# Patient Record
Sex: Female | Born: 1935 | Race: White | Hispanic: No | Smoking: Never smoker
Health system: Southern US, Community
[De-identification: ages and names within clinical notes are randomized; demographics above are authoritative.]

## PROBLEM LIST (undated history)

## (undated) DIAGNOSIS — H409 Unspecified glaucoma: Secondary | ICD-10-CM

## (undated) DIAGNOSIS — K56609 Unspecified intestinal obstruction, unspecified as to partial versus complete obstruction: Secondary | ICD-10-CM

## (undated) DIAGNOSIS — E039 Hypothyroidism, unspecified: Secondary | ICD-10-CM

## (undated) HISTORY — PX: ABDOMINAL HYSTERECTOMY: SHX81

## (undated) HISTORY — PX: OTHER SURGICAL HISTORY: SHX169

---

## 1998-03-11 ENCOUNTER — Other Ambulatory Visit: Admission: RE | Admit: 1998-03-11 | Discharge: 1998-03-11 | Payer: Self-pay | Admitting: Obstetrics and Gynecology

## 2004-01-29 ENCOUNTER — Ambulatory Visit (HOSPITAL_COMMUNITY): Admission: RE | Admit: 2004-01-29 | Discharge: 2004-01-29 | Payer: Self-pay | Admitting: Unknown Physician Specialty

## 2005-01-10 ENCOUNTER — Ambulatory Visit (HOSPITAL_COMMUNITY): Admission: RE | Admit: 2005-01-10 | Discharge: 2005-01-10 | Payer: Self-pay | Admitting: Family Medicine

## 2007-04-24 ENCOUNTER — Ambulatory Visit (HOSPITAL_COMMUNITY): Admission: RE | Admit: 2007-04-24 | Discharge: 2007-04-24 | Payer: Self-pay | Admitting: Pediatrics

## 2007-05-29 ENCOUNTER — Ambulatory Visit (HOSPITAL_COMMUNITY): Admission: RE | Admit: 2007-05-29 | Discharge: 2007-05-29 | Payer: Self-pay | Admitting: Pediatrics

## 2008-06-11 ENCOUNTER — Ambulatory Visit (HOSPITAL_COMMUNITY): Admission: RE | Admit: 2008-06-11 | Discharge: 2008-06-11 | Payer: Self-pay | Admitting: Pediatrics

## 2008-06-19 ENCOUNTER — Ambulatory Visit (HOSPITAL_COMMUNITY): Admission: RE | Admit: 2008-06-19 | Discharge: 2008-06-19 | Payer: Self-pay | Admitting: Pediatrics

## 2009-06-22 ENCOUNTER — Ambulatory Visit (HOSPITAL_COMMUNITY): Admission: RE | Admit: 2009-06-22 | Discharge: 2009-06-22 | Payer: Self-pay | Admitting: Pediatrics

## 2010-06-28 ENCOUNTER — Ambulatory Visit (HOSPITAL_COMMUNITY): Admission: RE | Admit: 2010-06-28 | Discharge: 2010-06-28 | Payer: Self-pay | Admitting: Pediatrics

## 2010-07-22 ENCOUNTER — Ambulatory Visit (HOSPITAL_COMMUNITY): Admission: RE | Admit: 2010-07-22 | Discharge: 2010-07-22 | Payer: Self-pay | Admitting: Ophthalmology

## 2010-08-19 ENCOUNTER — Ambulatory Visit (HOSPITAL_COMMUNITY): Admission: RE | Admit: 2010-08-19 | Discharge: 2010-08-19 | Payer: Self-pay | Admitting: Ophthalmology

## 2010-09-15 ENCOUNTER — Ambulatory Visit (HOSPITAL_COMMUNITY): Admission: RE | Admit: 2010-09-15 | Discharge: 2010-09-15 | Payer: Self-pay | Admitting: Pediatrics

## 2010-11-21 ENCOUNTER — Encounter: Payer: Self-pay | Admitting: Pediatrics

## 2010-11-22 ENCOUNTER — Encounter: Payer: Self-pay | Admitting: Pediatrics

## 2011-01-13 LAB — BASIC METABOLIC PANEL
BUN: 16 mg/dL (ref 6–23)
CO2: 29 mEq/L (ref 19–32)
Chloride: 102 mEq/L (ref 96–112)
Creatinine, Ser: 0.7 mg/dL (ref 0.4–1.2)
Glucose, Bld: 67 mg/dL — ABNORMAL LOW (ref 70–99)
Potassium: 4.4 mEq/L (ref 3.5–5.1)

## 2011-04-07 ENCOUNTER — Encounter (INDEPENDENT_AMBULATORY_CARE_PROVIDER_SITE_OTHER): Payer: Self-pay | Admitting: Internal Medicine

## 2011-06-01 MED ORDER — SODIUM CHLORIDE 0.45 % IV SOLN
Freq: Once | INTRAVENOUS | Status: AC
  Administered 2011-06-02: 11:00:00 via INTRAVENOUS

## 2011-06-02 ENCOUNTER — Ambulatory Visit (HOSPITAL_COMMUNITY)
Admission: RE | Admit: 2011-06-02 | Discharge: 2011-06-02 | Disposition: A | Discharge status: Home / Self Care | Payer: Medicare Other | Source: Ambulatory Visit | Attending: Internal Medicine | Admitting: Internal Medicine

## 2011-06-02 ENCOUNTER — Encounter (HOSPITAL_COMMUNITY)
Admission: RE | Disposition: A | Discharge status: Home / Self Care | Payer: Self-pay | Source: Ambulatory Visit | Attending: Internal Medicine

## 2011-06-02 ENCOUNTER — Encounter (HOSPITAL_COMMUNITY): Payer: Self-pay | Admitting: *Deleted

## 2011-06-02 ENCOUNTER — Encounter (INDEPENDENT_AMBULATORY_CARE_PROVIDER_SITE_OTHER): Payer: Medicare Other | Admitting: Internal Medicine

## 2011-06-02 DIAGNOSIS — Z860100 Personal history of colon polyps, unspecified: Secondary | ICD-10-CM | POA: Insufficient documentation

## 2011-06-02 DIAGNOSIS — K5730 Diverticulosis of large intestine without perforation or abscess without bleeding: Secondary | ICD-10-CM | POA: Insufficient documentation

## 2011-06-02 HISTORY — PX: COLONOSCOPY: SHX5424

## 2011-06-02 HISTORY — DX: Hypothyroidism, unspecified: E03.9

## 2011-06-02 SURGERY — COLONOSCOPY
Anesthesia: Moderate Sedation

## 2011-06-02 MED ORDER — MEPERIDINE HCL 50 MG/ML IJ SOLN
INTRAMUSCULAR | Status: DC | PRN
  Administered 2011-06-02 (×2): 25 mg via INTRAVENOUS

## 2011-06-02 MED ORDER — STERILE WATER FOR IRRIGATION IR SOLN
Status: DC | PRN
  Administered 2011-06-02: 12:00:00

## 2011-06-02 MED ORDER — MIDAZOLAM HCL 5 MG/5ML IJ SOLN
INTRAMUSCULAR | Status: DC | PRN
  Administered 2011-06-02: 2 mg via INTRAVENOUS

## 2011-06-02 MED ORDER — MEPERIDINE HCL 50 MG/ML IJ SOLN
INTRAMUSCULAR | Status: AC
  Filled 2011-06-02: qty 1

## 2011-06-02 MED ORDER — MIDAZOLAM HCL 5 MG/5ML IJ SOLN
INTRAMUSCULAR | Status: AC
  Filled 2011-06-02: qty 10

## 2011-06-02 NOTE — Op Note (Signed)
COLONOSCOPY PROCEDURE REPORT  PATIENT:  Megan Conley  MR#:  [id number] Birthdate:  Nov 15, 1935, 75 y.o., female Endoscopist:  Dr. Malissa Hippo, MD Referred By:  Dr. Vivia Ewing. Procedure Date: 06/02/2011  Procedure:   Colonoscopy  Indications:  Patient had large adenoma removed from rectosigmoid junction in October 2008. She is here for surveillance colonoscopy.  Informed Consent:  The risks, benefits, alternatives & imponderables which include, but are not limited to, bleeding, infection, perforation, drug reaction and potential missed lesion have been reviewed.  The potential for biopsy, lesion removal, etc. Have also been discussed.  Questions have been answered.  All parties agreeable.  Please see history & physical in medical record for more information.  Medications:  Demerol 50mg  IV Versed 2mg  IV  Description of procedure:  After a digital rectal exam was performed, that colonoscope was advanced from the anus through the rectum and colon to the area of the cecum, ileocecal valve and appendiceal orifice. The cecum was deeply intubated. These structures were well-seen and photographed for the record. From the level of the cecum and ileocecal valve, the scope was slowly and cautiously withdrawn. The mucosal surfaces were carefully surveyed utilizing scope tip to flexion to facilitate fold flattening as needed. The scope was pulled down into the rectum where a thorough exam including retroflexion was performed.  Prep:  Excellent. Findings:  Single diverticulum at sigmoid colon. Long linear scar at rectosigmoid junction site of previous polypectomy. No evidence of recurrence  Therapeutic/Diagnostic Maneuvers Performed:  None  Complications:  None  Cecal Withdrawal Time:  9 minutes  Impression:  Single sigmoid colon diverticulum otherwise normal examination.  Recommendations:  Next exam in 5 years.  REHMAN,NAJEEB U  06/02/2011 12:02 PM  CC: Dr. Ara Kussmaul, MD

## 2011-06-02 NOTE — H&P (Signed)
  Primary Care Physician:  Ara Kussmaul, MD Primary Gastroenterologist:  Dr. Karilyn Cota  Pre-Procedure History & Physical: HPI:  Megan Conley is a 75 y.o. female here for for surveillance colonoscopy. . She had  large adenoma removed from rectosigmoid junction in October 2008. She had a sigmoidoscopy in March 2009 no residual polyp was found. She she's feeling fine. She denies abdominal pain rectal bleeding or change in her bowel habits.  Past Medical History  Diagnosis Date  . Hypothyroidism     Past Surgical History  Procedure Date  . Abdominal hysterectomy   . Cataract extraction both eyes     Prior to Admission medications   Medication Sig Start Date End Date Taking? Authorizing Provider  levothyroxine (SYNTHROID, LEVOTHROID) 50 MCG tablet Take 50 mcg by mouth daily.     Yes Historical Provider, MD  Omega-3 Fatty Acids (FISH OIL) 500 MG CAPS Take by mouth.     Yes Historical Provider, MD  Red Yeast Rice 600 MG CAPS Take by mouth.     Yes Historical Provider, MD    Allergies as of 04/22/2011  . (Not on File)    History reviewed. No pertinent family history.  History   Social History  . Marital Status: Married    Spouse Name: N/A    Number of Children: N/A  . Years of Education: N/A   Occupational History  . Not on file.   Social History Main Topics  . Smoking status: Never Smoker   . Smokeless tobacco: Not on file  . Alcohol Use: 2.4 oz/week    4 Glasses of wine per week  . Drug Use: No  . Sexually Active:    Other Topics Concern  . Not on file   Social History Narrative  . No narrative on file    Physical Exam: BP 123/54  Pulse 56  Temp(Src) 97.6 F (36.4 C) (Oral)  Resp 18  Ht 5\' 2"  (1.575 m)  Wt 126 lb (57.153 kg)  BMI 23.05 kg/m2  SpO2 100% General:   Alert,  Well-developed, well-nourished, pleasant and cooperative in NAD Head:  Normocephalic and atraumatic. Eyes:  Sclera clear, no icterus.   Conjunctiva pink. Mouth:  Oropharyngeal mucosa  is normal.  Neck:  Supple; no masses or thyromegaly. Lungs:  Clear throughout to auscultation.   No wheezes, crackles, or rhonchi. No acute distress. Heart:  Regular rate and rhythm; no murmurs, clicks, rubs,  or gallops. Abdomen:  Soft, nontender and nondistended. No masses, hepatosplenomegaly or hernias noted. Normal bowel sounds, without guarding, and without rebound.   Rectal:  Deferred until time of colonoscopy.   Extremities:  Without clubbing or edema. Neurologic:  Alert and  oriented x4;  grossly normal neurologically. Skin:  Intact without significant lesions or rashes.  Impression/Plan:   History of complex colonic adenoma in October 2008 with a negative sigmoidoscopy in March 2009. Patient undergoing surveillance colonoscopy. Procedure and risks reviewed with the patient and she is agreeable.

## 2011-06-09 ENCOUNTER — Encounter (HOSPITAL_COMMUNITY): Payer: Self-pay | Admitting: Internal Medicine

## 2011-06-14 ENCOUNTER — Other Ambulatory Visit (HOSPITAL_COMMUNITY): Payer: Self-pay | Admitting: Internal Medicine

## 2011-06-14 DIAGNOSIS — Z139 Encounter for screening, unspecified: Secondary | ICD-10-CM

## 2011-07-01 ENCOUNTER — Ambulatory Visit (HOSPITAL_COMMUNITY)
Admission: RE | Admit: 2011-07-01 | Discharge: 2011-07-01 | Disposition: A | Discharge status: Home / Self Care | Payer: Medicare Other | Source: Ambulatory Visit | Attending: Internal Medicine | Admitting: Internal Medicine

## 2011-07-01 DIAGNOSIS — Z139 Encounter for screening, unspecified: Secondary | ICD-10-CM

## 2011-07-01 DIAGNOSIS — Z1231 Encounter for screening mammogram for malignant neoplasm of breast: Secondary | ICD-10-CM | POA: Insufficient documentation

## 2012-01-24 DIAGNOSIS — E785 Hyperlipidemia, unspecified: Secondary | ICD-10-CM | POA: Diagnosis not present

## 2012-01-24 DIAGNOSIS — M810 Age-related osteoporosis without current pathological fracture: Secondary | ICD-10-CM | POA: Diagnosis not present

## 2012-07-12 DIAGNOSIS — E039 Hypothyroidism, unspecified: Secondary | ICD-10-CM | POA: Diagnosis not present

## 2012-07-12 DIAGNOSIS — E785 Hyperlipidemia, unspecified: Secondary | ICD-10-CM | POA: Diagnosis not present

## 2012-07-12 DIAGNOSIS — Z23 Encounter for immunization: Secondary | ICD-10-CM | POA: Diagnosis not present

## 2012-07-19 ENCOUNTER — Other Ambulatory Visit (HOSPITAL_COMMUNITY): Payer: Self-pay | Admitting: Internal Medicine

## 2012-07-19 DIAGNOSIS — Z139 Encounter for screening, unspecified: Secondary | ICD-10-CM

## 2012-07-26 ENCOUNTER — Ambulatory Visit (HOSPITAL_COMMUNITY)
Admission: RE | Admit: 2012-07-26 | Discharge: 2012-07-26 | Disposition: A | Discharge status: Home / Self Care | Payer: Medicare Other | Source: Ambulatory Visit | Attending: Internal Medicine | Admitting: Internal Medicine

## 2012-07-26 DIAGNOSIS — E039 Hypothyroidism, unspecified: Secondary | ICD-10-CM | POA: Diagnosis not present

## 2012-07-26 DIAGNOSIS — Z1231 Encounter for screening mammogram for malignant neoplasm of breast: Secondary | ICD-10-CM | POA: Insufficient documentation

## 2012-07-26 DIAGNOSIS — E785 Hyperlipidemia, unspecified: Secondary | ICD-10-CM | POA: Diagnosis not present

## 2012-07-26 DIAGNOSIS — Z139 Encounter for screening, unspecified: Secondary | ICD-10-CM

## 2012-08-13 DIAGNOSIS — H52229 Regular astigmatism, unspecified eye: Secondary | ICD-10-CM | POA: Diagnosis not present

## 2012-08-13 DIAGNOSIS — H524 Presbyopia: Secondary | ICD-10-CM | POA: Diagnosis not present

## 2012-08-13 DIAGNOSIS — H5210 Myopia, unspecified eye: Secondary | ICD-10-CM | POA: Diagnosis not present

## 2012-08-13 DIAGNOSIS — H4020X0 Unspecified primary angle-closure glaucoma, stage unspecified: Secondary | ICD-10-CM | POA: Diagnosis not present

## 2012-09-14 ENCOUNTER — Encounter (INDEPENDENT_AMBULATORY_CARE_PROVIDER_SITE_OTHER): Payer: Self-pay

## 2012-10-29 DIAGNOSIS — H40009 Preglaucoma, unspecified, unspecified eye: Secondary | ICD-10-CM | POA: Diagnosis not present

## 2012-11-16 DIAGNOSIS — H40019 Open angle with borderline findings, low risk, unspecified eye: Secondary | ICD-10-CM | POA: Diagnosis not present

## 2012-11-16 DIAGNOSIS — H52209 Unspecified astigmatism, unspecified eye: Secondary | ICD-10-CM | POA: Diagnosis not present

## 2012-11-16 DIAGNOSIS — H47099 Other disorders of optic nerve, not elsewhere classified, unspecified eye: Secondary | ICD-10-CM | POA: Diagnosis not present

## 2012-11-22 DIAGNOSIS — E785 Hyperlipidemia, unspecified: Secondary | ICD-10-CM | POA: Diagnosis not present

## 2012-11-22 DIAGNOSIS — R42 Dizziness and giddiness: Secondary | ICD-10-CM | POA: Diagnosis not present

## 2012-11-22 DIAGNOSIS — H47099 Other disorders of optic nerve, not elsewhere classified, unspecified eye: Secondary | ICD-10-CM | POA: Diagnosis not present

## 2012-11-22 DIAGNOSIS — H40019 Open angle with borderline findings, low risk, unspecified eye: Secondary | ICD-10-CM | POA: Diagnosis not present

## 2012-11-22 DIAGNOSIS — R5381 Other malaise: Secondary | ICD-10-CM | POA: Diagnosis not present

## 2012-12-20 DIAGNOSIS — H401290 Low-tension glaucoma, unspecified eye, stage unspecified: Secondary | ICD-10-CM | POA: Diagnosis not present

## 2012-12-20 DIAGNOSIS — H409 Unspecified glaucoma: Secondary | ICD-10-CM | POA: Diagnosis not present

## 2013-01-21 DIAGNOSIS — H409 Unspecified glaucoma: Secondary | ICD-10-CM | POA: Diagnosis not present

## 2013-01-21 DIAGNOSIS — H401290 Low-tension glaucoma, unspecified eye, stage unspecified: Secondary | ICD-10-CM | POA: Diagnosis not present

## 2013-06-06 DIAGNOSIS — H409 Unspecified glaucoma: Secondary | ICD-10-CM | POA: Diagnosis not present

## 2013-06-06 DIAGNOSIS — H401290 Low-tension glaucoma, unspecified eye, stage unspecified: Secondary | ICD-10-CM | POA: Diagnosis not present

## 2013-07-26 DIAGNOSIS — I10 Essential (primary) hypertension: Secondary | ICD-10-CM | POA: Diagnosis not present

## 2013-07-26 DIAGNOSIS — E039 Hypothyroidism, unspecified: Secondary | ICD-10-CM | POA: Diagnosis not present

## 2013-07-26 DIAGNOSIS — E785 Hyperlipidemia, unspecified: Secondary | ICD-10-CM | POA: Diagnosis not present

## 2013-07-29 DIAGNOSIS — E039 Hypothyroidism, unspecified: Secondary | ICD-10-CM | POA: Diagnosis not present

## 2013-07-29 DIAGNOSIS — R42 Dizziness and giddiness: Secondary | ICD-10-CM | POA: Diagnosis not present

## 2013-07-29 DIAGNOSIS — Z23 Encounter for immunization: Secondary | ICD-10-CM | POA: Diagnosis not present

## 2013-07-29 DIAGNOSIS — E785 Hyperlipidemia, unspecified: Secondary | ICD-10-CM | POA: Diagnosis not present

## 2013-08-02 ENCOUNTER — Other Ambulatory Visit (HOSPITAL_COMMUNITY): Payer: Self-pay | Admitting: Internal Medicine

## 2013-08-02 DIAGNOSIS — Z139 Encounter for screening, unspecified: Secondary | ICD-10-CM

## 2013-08-06 ENCOUNTER — Ambulatory Visit (HOSPITAL_COMMUNITY)
Admission: RE | Admit: 2013-08-06 | Discharge: 2013-08-06 | Disposition: A | Discharge status: Home / Self Care | Payer: Medicare Other | Source: Ambulatory Visit | Attending: Internal Medicine | Admitting: Internal Medicine

## 2013-08-06 DIAGNOSIS — Z1231 Encounter for screening mammogram for malignant neoplasm of breast: Secondary | ICD-10-CM | POA: Diagnosis not present

## 2013-08-06 DIAGNOSIS — Z139 Encounter for screening, unspecified: Secondary | ICD-10-CM

## 2013-12-06 DIAGNOSIS — H401290 Low-tension glaucoma, unspecified eye, stage unspecified: Secondary | ICD-10-CM | POA: Diagnosis not present

## 2013-12-06 DIAGNOSIS — Z961 Presence of intraocular lens: Secondary | ICD-10-CM | POA: Diagnosis not present

## 2013-12-06 DIAGNOSIS — H409 Unspecified glaucoma: Secondary | ICD-10-CM | POA: Diagnosis not present

## 2014-01-28 DIAGNOSIS — E039 Hypothyroidism, unspecified: Secondary | ICD-10-CM | POA: Diagnosis not present

## 2014-01-28 DIAGNOSIS — I10 Essential (primary) hypertension: Secondary | ICD-10-CM | POA: Diagnosis not present

## 2014-01-28 DIAGNOSIS — E785 Hyperlipidemia, unspecified: Secondary | ICD-10-CM | POA: Diagnosis not present

## 2014-01-30 DIAGNOSIS — E039 Hypothyroidism, unspecified: Secondary | ICD-10-CM | POA: Diagnosis not present

## 2014-01-30 DIAGNOSIS — E785 Hyperlipidemia, unspecified: Secondary | ICD-10-CM | POA: Diagnosis not present

## 2014-06-05 DIAGNOSIS — H409 Unspecified glaucoma: Secondary | ICD-10-CM | POA: Diagnosis not present

## 2014-06-05 DIAGNOSIS — H401290 Low-tension glaucoma, unspecified eye, stage unspecified: Secondary | ICD-10-CM | POA: Diagnosis not present

## 2014-07-17 ENCOUNTER — Other Ambulatory Visit (HOSPITAL_COMMUNITY): Payer: Self-pay | Admitting: Internal Medicine

## 2014-07-17 DIAGNOSIS — Z1231 Encounter for screening mammogram for malignant neoplasm of breast: Secondary | ICD-10-CM

## 2014-08-07 DIAGNOSIS — E039 Hypothyroidism, unspecified: Secondary | ICD-10-CM | POA: Diagnosis not present

## 2014-08-07 DIAGNOSIS — Z23 Encounter for immunization: Secondary | ICD-10-CM | POA: Diagnosis not present

## 2014-08-07 DIAGNOSIS — E785 Hyperlipidemia, unspecified: Secondary | ICD-10-CM | POA: Diagnosis not present

## 2014-08-12 DIAGNOSIS — E039 Hypothyroidism, unspecified: Secondary | ICD-10-CM | POA: Diagnosis not present

## 2014-08-12 DIAGNOSIS — E782 Mixed hyperlipidemia: Secondary | ICD-10-CM | POA: Diagnosis not present

## 2014-08-21 ENCOUNTER — Ambulatory Visit (HOSPITAL_COMMUNITY)
Admission: RE | Admit: 2014-08-21 | Discharge: 2014-08-21 | Disposition: A | Discharge status: Home / Self Care | Payer: Medicare Other | Source: Ambulatory Visit | Attending: Internal Medicine | Admitting: Internal Medicine

## 2014-08-21 DIAGNOSIS — Z1231 Encounter for screening mammogram for malignant neoplasm of breast: Secondary | ICD-10-CM | POA: Insufficient documentation

## 2014-09-29 DIAGNOSIS — Z23 Encounter for immunization: Secondary | ICD-10-CM | POA: Diagnosis not present

## 2015-02-24 DIAGNOSIS — E039 Hypothyroidism, unspecified: Secondary | ICD-10-CM | POA: Diagnosis not present

## 2015-02-24 DIAGNOSIS — E782 Mixed hyperlipidemia: Secondary | ICD-10-CM | POA: Diagnosis not present

## 2015-03-02 DIAGNOSIS — H401211 Low-tension glaucoma, right eye, mild stage: Secondary | ICD-10-CM | POA: Diagnosis not present

## 2015-03-02 DIAGNOSIS — H401222 Low-tension glaucoma, left eye, moderate stage: Secondary | ICD-10-CM | POA: Diagnosis not present

## 2015-03-03 DIAGNOSIS — I10 Essential (primary) hypertension: Secondary | ICD-10-CM | POA: Diagnosis not present

## 2015-03-03 DIAGNOSIS — E039 Hypothyroidism, unspecified: Secondary | ICD-10-CM | POA: Diagnosis not present

## 2015-03-03 DIAGNOSIS — E782 Mixed hyperlipidemia: Secondary | ICD-10-CM | POA: Diagnosis not present

## 2015-05-25 DIAGNOSIS — E039 Hypothyroidism, unspecified: Secondary | ICD-10-CM | POA: Diagnosis not present

## 2015-05-25 DIAGNOSIS — Z23 Encounter for immunization: Secondary | ICD-10-CM | POA: Diagnosis not present

## 2015-05-25 DIAGNOSIS — I10 Essential (primary) hypertension: Secondary | ICD-10-CM | POA: Diagnosis not present

## 2015-05-25 DIAGNOSIS — E782 Mixed hyperlipidemia: Secondary | ICD-10-CM | POA: Diagnosis not present

## 2015-08-11 ENCOUNTER — Other Ambulatory Visit (HOSPITAL_COMMUNITY): Payer: Self-pay | Admitting: Internal Medicine

## 2015-08-11 DIAGNOSIS — Z1231 Encounter for screening mammogram for malignant neoplasm of breast: Secondary | ICD-10-CM

## 2015-09-02 DIAGNOSIS — Z634 Disappearance and death of family member: Secondary | ICD-10-CM | POA: Diagnosis not present

## 2015-09-02 DIAGNOSIS — Z23 Encounter for immunization: Secondary | ICD-10-CM | POA: Diagnosis not present

## 2015-09-02 DIAGNOSIS — E039 Hypothyroidism, unspecified: Secondary | ICD-10-CM | POA: Diagnosis not present

## 2015-09-03 DIAGNOSIS — H401221 Low-tension glaucoma, left eye, mild stage: Secondary | ICD-10-CM | POA: Diagnosis not present

## 2015-09-03 DIAGNOSIS — H401212 Low-tension glaucoma, right eye, moderate stage: Secondary | ICD-10-CM | POA: Diagnosis not present

## 2015-09-09 ENCOUNTER — Ambulatory Visit (HOSPITAL_COMMUNITY)
Admission: RE | Admit: 2015-09-09 | Discharge: 2015-09-09 | Disposition: A | Discharge status: Home / Self Care | Payer: Medicare Other | Source: Ambulatory Visit | Attending: Internal Medicine | Admitting: Internal Medicine

## 2015-09-09 DIAGNOSIS — Z1231 Encounter for screening mammogram for malignant neoplasm of breast: Secondary | ICD-10-CM | POA: Diagnosis not present

## 2016-03-04 DIAGNOSIS — H401212 Low-tension glaucoma, right eye, moderate stage: Secondary | ICD-10-CM | POA: Diagnosis not present

## 2016-03-10 DIAGNOSIS — E039 Hypothyroidism, unspecified: Secondary | ICD-10-CM | POA: Diagnosis not present

## 2016-03-10 DIAGNOSIS — E782 Mixed hyperlipidemia: Secondary | ICD-10-CM | POA: Diagnosis not present

## 2016-03-21 DIAGNOSIS — E782 Mixed hyperlipidemia: Secondary | ICD-10-CM | POA: Diagnosis not present

## 2016-03-21 DIAGNOSIS — H409 Unspecified glaucoma: Secondary | ICD-10-CM | POA: Diagnosis not present

## 2016-03-21 DIAGNOSIS — E039 Hypothyroidism, unspecified: Secondary | ICD-10-CM | POA: Diagnosis not present

## 2016-03-22 ENCOUNTER — Other Ambulatory Visit (HOSPITAL_COMMUNITY): Payer: Self-pay | Admitting: Internal Medicine

## 2016-03-22 DIAGNOSIS — Z780 Asymptomatic menopausal state: Secondary | ICD-10-CM

## 2016-03-31 ENCOUNTER — Ambulatory Visit (HOSPITAL_COMMUNITY)
Admission: RE | Admit: 2016-03-31 | Discharge: 2016-03-31 | Disposition: A | Discharge status: Home / Self Care | Payer: Medicare Other | Source: Ambulatory Visit | Attending: Internal Medicine | Admitting: Internal Medicine

## 2016-03-31 DIAGNOSIS — Z780 Asymptomatic menopausal state: Secondary | ICD-10-CM

## 2016-03-31 DIAGNOSIS — M818 Other osteoporosis without current pathological fracture: Secondary | ICD-10-CM | POA: Diagnosis not present

## 2016-03-31 DIAGNOSIS — M8588 Other specified disorders of bone density and structure, other site: Secondary | ICD-10-CM | POA: Diagnosis not present

## 2016-03-31 DIAGNOSIS — M810 Age-related osteoporosis without current pathological fracture: Secondary | ICD-10-CM | POA: Diagnosis present

## 2016-05-05 ENCOUNTER — Ambulatory Visit (HOSPITAL_COMMUNITY): Payer: Medicare Other

## 2016-05-25 ENCOUNTER — Encounter (INDEPENDENT_AMBULATORY_CARE_PROVIDER_SITE_OTHER): Payer: Self-pay | Admitting: *Deleted

## 2016-06-30 ENCOUNTER — Other Ambulatory Visit: Payer: Self-pay

## 2016-07-29 DIAGNOSIS — Z23 Encounter for immunization: Secondary | ICD-10-CM | POA: Diagnosis not present

## 2016-08-25 ENCOUNTER — Other Ambulatory Visit (HOSPITAL_COMMUNITY): Payer: Self-pay | Admitting: Internal Medicine

## 2016-08-25 ENCOUNTER — Other Ambulatory Visit (INDEPENDENT_AMBULATORY_CARE_PROVIDER_SITE_OTHER): Payer: Self-pay | Admitting: *Deleted

## 2016-08-25 DIAGNOSIS — Z1231 Encounter for screening mammogram for malignant neoplasm of breast: Secondary | ICD-10-CM

## 2016-08-25 DIAGNOSIS — Z86010 Personal history of colonic polyps: Secondary | ICD-10-CM | POA: Insufficient documentation

## 2016-09-08 DIAGNOSIS — H401212 Low-tension glaucoma, right eye, moderate stage: Secondary | ICD-10-CM | POA: Diagnosis not present

## 2016-09-12 ENCOUNTER — Ambulatory Visit (HOSPITAL_COMMUNITY)
Admission: RE | Admit: 2016-09-12 | Discharge: 2016-09-12 | Disposition: A | Discharge status: Home / Self Care | Payer: Medicare Other | Source: Ambulatory Visit | Attending: Internal Medicine | Admitting: Internal Medicine

## 2016-09-12 DIAGNOSIS — I482 Chronic atrial fibrillation: Secondary | ICD-10-CM | POA: Diagnosis not present

## 2016-09-12 DIAGNOSIS — Z1231 Encounter for screening mammogram for malignant neoplasm of breast: Secondary | ICD-10-CM | POA: Diagnosis not present

## 2016-09-12 DIAGNOSIS — R7301 Impaired fasting glucose: Secondary | ICD-10-CM | POA: Diagnosis not present

## 2016-09-12 DIAGNOSIS — I10 Essential (primary) hypertension: Secondary | ICD-10-CM | POA: Diagnosis not present

## 2016-09-12 DIAGNOSIS — E119 Type 2 diabetes mellitus without complications: Secondary | ICD-10-CM | POA: Diagnosis not present

## 2016-09-12 DIAGNOSIS — E785 Hyperlipidemia, unspecified: Secondary | ICD-10-CM | POA: Diagnosis not present

## 2016-09-12 DIAGNOSIS — E782 Mixed hyperlipidemia: Secondary | ICD-10-CM | POA: Diagnosis not present

## 2016-09-12 DIAGNOSIS — E039 Hypothyroidism, unspecified: Secondary | ICD-10-CM | POA: Diagnosis not present

## 2016-09-12 DIAGNOSIS — D509 Iron deficiency anemia, unspecified: Secondary | ICD-10-CM | POA: Diagnosis not present

## 2016-09-14 DIAGNOSIS — E039 Hypothyroidism, unspecified: Secondary | ICD-10-CM | POA: Diagnosis not present

## 2016-09-14 DIAGNOSIS — E782 Mixed hyperlipidemia: Secondary | ICD-10-CM | POA: Diagnosis not present

## 2016-09-14 DIAGNOSIS — H409 Unspecified glaucoma: Secondary | ICD-10-CM | POA: Diagnosis not present

## 2016-09-15 ENCOUNTER — Emergency Department (HOSPITAL_COMMUNITY): Payer: Medicare Other

## 2016-09-15 ENCOUNTER — Inpatient Hospital Stay (HOSPITAL_COMMUNITY)
Admission: EM | Admit: 2016-09-15 | Discharge: 2016-09-19 | DRG: 390 | Disposition: A | Discharge status: Home / Self Care | Payer: Medicare Other | Attending: Internal Medicine | Admitting: Internal Medicine

## 2016-09-15 ENCOUNTER — Encounter (HOSPITAL_COMMUNITY): Payer: Self-pay

## 2016-09-15 DIAGNOSIS — R1030 Lower abdominal pain, unspecified: Secondary | ICD-10-CM | POA: Diagnosis not present

## 2016-09-15 DIAGNOSIS — R739 Hyperglycemia, unspecified: Secondary | ICD-10-CM | POA: Diagnosis not present

## 2016-09-15 DIAGNOSIS — D72829 Elevated white blood cell count, unspecified: Secondary | ICD-10-CM | POA: Diagnosis not present

## 2016-09-15 DIAGNOSIS — F419 Anxiety disorder, unspecified: Secondary | ICD-10-CM | POA: Diagnosis not present

## 2016-09-15 DIAGNOSIS — Z90710 Acquired absence of both cervix and uterus: Secondary | ICD-10-CM

## 2016-09-15 DIAGNOSIS — E039 Hypothyroidism, unspecified: Secondary | ICD-10-CM | POA: Diagnosis not present

## 2016-09-15 DIAGNOSIS — K56609 Unspecified intestinal obstruction, unspecified as to partial versus complete obstruction: Principal | ICD-10-CM | POA: Diagnosis present

## 2016-09-15 DIAGNOSIS — K5730 Diverticulosis of large intestine without perforation or abscess without bleeding: Secondary | ICD-10-CM | POA: Diagnosis not present

## 2016-09-15 DIAGNOSIS — R030 Elevated blood-pressure reading, without diagnosis of hypertension: Secondary | ICD-10-CM | POA: Diagnosis present

## 2016-09-15 DIAGNOSIS — K56699 Other intestinal obstruction unspecified as to partial versus complete obstruction: Secondary | ICD-10-CM | POA: Diagnosis not present

## 2016-09-15 DIAGNOSIS — R109 Unspecified abdominal pain: Secondary | ICD-10-CM | POA: Diagnosis not present

## 2016-09-15 DIAGNOSIS — Z79899 Other long term (current) drug therapy: Secondary | ICD-10-CM

## 2016-09-15 MED ORDER — ONDANSETRON HCL 4 MG/2ML IJ SOLN
4.0000 mg | Freq: Once | INTRAMUSCULAR | Status: AC
  Administered 2016-09-16: 4 mg via INTRAVENOUS
  Filled 2016-09-15: qty 2

## 2016-09-15 MED ORDER — FENTANYL CITRATE (PF) 100 MCG/2ML IJ SOLN
50.0000 ug | Freq: Once | INTRAMUSCULAR | Status: AC
  Administered 2016-09-16: 50 ug via INTRAVENOUS
  Filled 2016-09-15: qty 2

## 2016-09-15 MED ORDER — SODIUM CHLORIDE 0.9 % IV BOLUS (SEPSIS)
1000.0000 mL | Freq: Once | INTRAVENOUS | Status: AC
Start: 2016-09-16 — End: 2016-09-16
  Administered 2016-09-16: 1000 mL via INTRAVENOUS

## 2016-09-15 NOTE — ED Provider Notes (Signed)
[facility] DEPT Provider Note   CSN: [id number] Arrival date & time: 09/15/16  2327  By signing my name below, I, Royce Macadamia, attest that this documentation has been prepared under the direction and in the presence of Ripley Fraise, MD . Electronically Signed: Royce Macadamia, Scribe. 09/15/2016. 11:51 PM.  History   Chief Complaint Chief Complaint  Patient presents with  . Abdominal Pain    vomiting     HPI Comments:  Megan Conley is a 80 y.o. female who presents to the Emergency Department complaining of abdominal pain and vomiting onset at noon today.  Pt states that she had a bowel movement at 2300; she usually has bowel movement in the morning but did not have one this morning.          The history is provided by the patient and medical records. No language interpreter was used.  Abdominal Pain   This is a new problem. The current episode started 6 to 12 hours ago. Episode frequency: Waaxing and waning. The problem has been gradually worsening. The pain is associated with eating (Her pain was worse after eating a yogurt.  ). The pain is located in the LLQ and RLQ. The pain is moderate. Associated symptoms include nausea, vomiting and constipation. Pertinent negatives include fever and dysuria. Associated symptoms comments: Pt reports that she is throwing up everything that she eats. No chest pain, no SOB. The symptoms are aggravated by eating. Nothing relieves the symptoms. Past workup includes surgery (Hysterectomy). Her past medical history does not include PUD, gallstones, GERD, ulcerative colitis, Crohn's disease or irritable bowel syndrome.    Past Medical History:  Diagnosis Date  . Hypothyroidism     Patient Active Problem List   Diagnosis Date Noted  . History of colonic polyps 08/25/2016    Past Surgical History:  Procedure Laterality Date  . ABDOMINAL HYSTERECTOMY    . cataract extraction both eyes    . COLONOSCOPY  06/02/2011   Procedure: COLONOSCOPY;  Surgeon: Rogene Houston, MD;  Location: [facility];  Service: Endoscopy;  Laterality: N/A;    OB History    No data available       Home Medications    Prior to Admission medications   Medication Sig Start Date End Date Taking? Authorizing Provider  calcium carbonate 1250 MG capsule Take 1,250 mg by mouth 2 (two) times daily with a meal.   Yes Historical Provider, MD  levothyroxine (SYNTHROID, LEVOTHROID) 50 MCG tablet Take 50 mcg by mouth daily.     Yes Historical Provider, MD  magnesium 30 MG tablet Take 30 mg by mouth 2 (two) times daily.   Yes Historical Provider, MD  Multiple Vitamin (MULTIVITAMIN) capsule Take 1 capsule by mouth daily.   Yes Historical Provider, MD  Omega-3 Fatty Acids (FISH OIL) 500 MG CAPS Take by mouth.     Yes Historical Provider, MD  Red Yeast Rice 600 MG CAPS Take by mouth.     Yes Historical Provider, MD    Family History No family history on file.  Social History Social History  Substance Use Topics  . Smoking status: Never Smoker  . Smokeless tobacco: Never Used  . Alcohol use 2.4 oz/week    4 Glasses of wine per week     Allergies   Patient has no known allergies.   Review of Systems Review of Systems  Constitutional: Negative for fever.  Respiratory: Negative for shortness of breath.   Cardiovascular: Negative for chest pain.  Gastrointestinal: Positive for abdominal pain, constipation, nausea and vomiting. Negative for blood in stool.  Genitourinary: Negative for dysuria.  All other systems reviewed and are negative.    Physical Exam Updated Vital Signs BP 180/83 (BP Location: Left Arm)   Pulse 68   Temp 97.8 F (36.6 C) (Oral)   Resp 18   SpO2 100%   Physical Exam  CONSTITUTIONAL: Well developed/well nourished HEAD: Normocephalic/atraumatic EYES: EOMI/PERRL ENMT: Mucous membranes moist NECK: supple no meningeal signs SPINE/BACK:entire spine nontender CV: S1/S2 noted, no  murmurs/rubs/gallops noted LUNGS: Lungs are clear to auscultation bilaterally, no apparent distress ABDOMEN: soft, decresed bowel sounds throughout.  Diffuse mild tenderness noted. GU:no cva tenderness NEURO: Pt is awake/alert/appropriate, moves all extremitiesx4.  No facial droop.   EXTREMITIES: pulses normal/equal, full ROM SKIN: warm, color normal PSYCH: no abnormalities of mood noted, alert and oriented to situation  ED Treatments / Results   DIAGNOSTIC STUDIES:  Oxygen Saturation is 100% on RA, NML by my interpretation.    COORDINATION OF CARE:  11:51 PM Discussed treatment plan with pt at bedside and pt agreed to plan.  Labs (all labs ordered are listed, but only abnormal results are displayed) Labs Reviewed  CBC WITH DIFFERENTIAL/PLATELET - Abnormal; Notable for the following:       Result Value   WBC 11.5 (*)    Neutro Abs 10.2 (*)    All other components within normal limits  COMPREHENSIVE METABOLIC PANEL - Abnormal; Notable for the following:    Glucose, Bld 166 (*)    GFR calc non Af Amer 55 (*)    All other components within normal limits  URINALYSIS, ROUTINE W REFLEX MICROSCOPIC (NOT AT [facility]) - Abnormal; Notable for the following:    Ketones, ur 15 (*)    All other components within normal limits  LIPASE, BLOOD    EKG  EKG Interpretation  Date/Time:  Thursday September 15 2016 23:59:19 EST Ventricular Rate:  64 PR Interval:    QRS Duration: 85 QT Interval:  443 QTC Calculation: 458 R Axis:   73 Text Interpretation:  Sinus rhythm Borderline low voltage, extremity leads No significant change since last tracing Confirmed by [place] ([id number]) on 09/16/2016 12:25:53 AM       Radiology Ct Abdomen Pelvis W Contrast  Result Date: 09/16/2016 CLINICAL DATA:  Initial evaluation for acute abdominal pain with eating. EXAM: CT ABDOMEN AND PELVIS WITH CONTRAST TECHNIQUE: Multidetector CT imaging of the abdomen and pelvis was performed using the standard  protocol following bolus administration of intravenous contrast. CONTRAST:  117mL ISOVUE-300 IOPAMIDOL (ISOVUE-300) INJECTION 61% COMPARISON:  Prior radiograph from earlier the same day. FINDINGS: Lower chest: Mild atelectatic changes within the visualized lung bases. Visualized lungs are otherwise clear. Hepatobiliary: The liver demonstrates a normal contrast enhanced appearance. Gallbladder within normal limits. No biliary dilatation. Pancreas: Pancreas within normal limits. Spleen: Spleen within normal limits. Adrenals/Urinary Tract: The adrenal glands are normal. Kidneys equal in size with symmetric enhancement. No nephrolithiasis, hydronephrosis, or focal enhancing renal mass. Ureters within normal limits. Bladder within normal limits. Stomach/Bowel: Stomach mildly distended with oral contrast material within the gastric lumen. There is a prominent loop of distal small bowel within the lower abdomen and measures up to 2.8 cm in diameter. Fecalization of enteric contents present within this loop of bowel. There is a focal transition point just distally within the right lower quadrant (series 2, image 58). Small bowel is decompressed for a short distance beyond this transition point. Distally,  there is a short loop of small bowel which is mildly prominent measuring up to 2.5 cm with associated fecalization. There is an apparent second transition point just distally (series 2, image 56 on axial sequence, series 4, image 33 on coronal sequence). Slight swirling of the mesenteric within this region. Findings are concerning for possible closed loop obstruction. Possible internal hernia may be present as well. Reactive edema with free fluid within this region. Ileum is decompressed distally to the ileocecal valve. Appendix not visualized. Colon within normal limits without acute inflammatory changes. Mild circumferential wall thickening about the descending colon felt to be related to incomplete distension. Mild  sigmoid diverticulosis noted without acute diverticulitis. Vascular/Lymphatic: Moderate aorto bi-iliac atherosclerotic disease. No aneurysm. No pathologically enlarged intra-abdominal pelvic lymph nodes identified. Reproductive: Patient is status post hysterectomy. Other: Small volume free fluid within the lower abdomen and pelvis, likely reactive. No free intraperitoneal air. Musculoskeletal: Wedging deformity involving the T11 vertebral body appears to be chronic in nature. No acute osseous abnormality. No worrisome lytic or blastic osseous lesions. The the IMPRESSION: 1. Findings concerning for closed loop small-bowel obstruction within the right lower quadrant as detailed above. Possible internal hernia may be present as well. Associated mesenteric edema with small volume reactive free fluid within the lower abdomen and pelvis. 2. Mild sigmoid diverticulosis without evidence for acute diverticulitis. 3. Chronic T11 compression fracture. Electronically Signed   By: Jeannine Boga M.D.   On: 09/16/2016 03:10   Dg Abd Acute W/chest  Result Date: 09/16/2016 CLINICAL DATA:  Abdominal pain and vomiting, onset today. EXAM: DG ABDOMEN ACUTE W/ 1V CHEST COMPARISON:  None. FINDINGS: There is no evidence of dilated bowel loops or free intraperitoneal air. No radiopaque calculi or other significant radiographic abnormality is seen. Heart size and mediastinal contours are within normal limits. Both lungs are clear except for benign appearing apical pleural thickening and fibrosis. IMPRESSION: Negative abdominal radiographs.  No acute cardiopulmonary disease. Electronically Signed   By: Andreas Newport M.D.   On: 09/16/2016 00:52    Procedures Procedures (including critical care time)  Medications Ordered in ED Medications  sodium chloride 0.9 % bolus 1,000 mL (1,000 mLs Intravenous New Bag/Given 09/16/16 0010)  ondansetron (ZOFRAN) injection 4 mg (4 mg Intravenous Given 09/16/16 0011)  fentaNYL  (SUBLIMAZE) injection 50 mcg (50 mcg Intravenous Given 09/16/16 0011)     Initial Impression / Assessment and Plan / ED Course  I have reviewed the triage vital signs and the nursing notes.  Pertinent labs & imaging results that were available during my care of the patient were reviewed by me and considered in my medical decision making (see chart for details).  Clinical Course     1:20 AM Pt with continued diffuse abd pain Xray negative for acute disease Due to persistent pain will proceed with CT imaging 3:27 AM CT imaging reveals SBO D/w dr Rosana Hoes with surgery He will review CT imaging and call back He requests NG tube placement This was discussed with patient 3:47 AM Dr Rosana Hoes has reviewed imaging He requests admission to medicine service 4:03 AM D/w dr Fuller Plan for admission to medical service Dr Rosana Hoes has called back and wants to speak to patient (pt did not tolerate NG placement)  Final Clinical Impressions(s) / ED Diagnoses   Final diagnoses:  Small bowel obstruction    New Prescriptions New Prescriptions   No medications on file   I personally performed the services described in this documentation, which was scribed in  my presence. The recorded information has been reviewed and is accurate.        Ripley Fraise, MD 09/16/16 [id number]

## 2016-09-15 NOTE — ED Triage Notes (Signed)
Abd discomfort and vomiting onset earlier today, has vomited x 4-5 and had one soft stool.   Pt states she also feels bloated

## 2016-09-16 ENCOUNTER — Inpatient Hospital Stay (HOSPITAL_COMMUNITY): Payer: Medicare Other

## 2016-09-16 ENCOUNTER — Emergency Department (HOSPITAL_COMMUNITY): Payer: Medicare Other

## 2016-09-16 ENCOUNTER — Encounter (HOSPITAL_COMMUNITY): Payer: Self-pay | Admitting: *Deleted

## 2016-09-16 DIAGNOSIS — R1030 Lower abdominal pain, unspecified: Secondary | ICD-10-CM | POA: Diagnosis not present

## 2016-09-16 DIAGNOSIS — Z4682 Encounter for fitting and adjustment of non-vascular catheter: Secondary | ICD-10-CM | POA: Diagnosis not present

## 2016-09-16 DIAGNOSIS — R739 Hyperglycemia, unspecified: Secondary | ICD-10-CM | POA: Diagnosis present

## 2016-09-16 DIAGNOSIS — D72829 Elevated white blood cell count, unspecified: Secondary | ICD-10-CM | POA: Diagnosis present

## 2016-09-16 DIAGNOSIS — K56609 Unspecified intestinal obstruction, unspecified as to partial versus complete obstruction: Secondary | ICD-10-CM | POA: Insufficient documentation

## 2016-09-16 DIAGNOSIS — E039 Hypothyroidism, unspecified: Secondary | ICD-10-CM

## 2016-09-16 DIAGNOSIS — K5730 Diverticulosis of large intestine without perforation or abscess without bleeding: Secondary | ICD-10-CM | POA: Diagnosis not present

## 2016-09-16 DIAGNOSIS — R030 Elevated blood-pressure reading, without diagnosis of hypertension: Secondary | ICD-10-CM | POA: Diagnosis present

## 2016-09-16 DIAGNOSIS — R109 Unspecified abdominal pain: Secondary | ICD-10-CM | POA: Diagnosis not present

## 2016-09-16 DIAGNOSIS — K5652 Intestinal adhesions [bands] with complete obstruction: Secondary | ICD-10-CM | POA: Diagnosis not present

## 2016-09-16 DIAGNOSIS — Z90710 Acquired absence of both cervix and uterus: Secondary | ICD-10-CM | POA: Diagnosis not present

## 2016-09-16 DIAGNOSIS — Z79899 Other long term (current) drug therapy: Secondary | ICD-10-CM | POA: Diagnosis not present

## 2016-09-16 DIAGNOSIS — E038 Other specified hypothyroidism: Secondary | ICD-10-CM | POA: Diagnosis not present

## 2016-09-16 DIAGNOSIS — F419 Anxiety disorder, unspecified: Secondary | ICD-10-CM | POA: Diagnosis present

## 2016-09-16 LAB — CBC WITH DIFFERENTIAL/PLATELET
Basophils Absolute: 0 10*3/uL (ref 0.0–0.1)
Basophils Relative: 0 %
EOS ABS: 0 10*3/uL (ref 0.0–0.7)
Eosinophils Relative: 0 %
HCT: 41.4 % (ref 36.0–46.0)
HEMOGLOBIN: 13.9 g/dL (ref 12.0–15.0)
LYMPHS ABS: 1.1 10*3/uL (ref 0.7–4.0)
Lymphocytes Relative: 9 %
MCH: 32 pg (ref 26.0–34.0)
MCHC: 33.6 g/dL (ref 30.0–36.0)
MCV: 95.4 fL (ref 78.0–100.0)
MONO ABS: 0.3 10*3/uL (ref 0.1–1.0)
MONOS PCT: 3 %
NEUTROS PCT: 88 %
Neutro Abs: 10.2 10*3/uL — ABNORMAL HIGH (ref 1.7–7.7)
Platelets: 288 10*3/uL (ref 150–400)
RBC: 4.34 MIL/uL (ref 3.87–5.11)
RDW: 12.7 % (ref 11.5–15.5)
WBC: 11.5 10*3/uL — ABNORMAL HIGH (ref 4.0–10.5)

## 2016-09-16 LAB — COMPREHENSIVE METABOLIC PANEL
ALK PHOS: 69 U/L (ref 38–126)
ALT: 20 U/L (ref 14–54)
ANION GAP: 10 (ref 5–15)
AST: 26 U/L (ref 15–41)
Albumin: 3.9 g/dL (ref 3.5–5.0)
BILIRUBIN TOTAL: 1 mg/dL (ref 0.3–1.2)
BUN: 20 mg/dL (ref 6–20)
CALCIUM: 9.5 mg/dL (ref 8.9–10.3)
CO2: 23 mmol/L (ref 22–32)
Chloride: 103 mmol/L (ref 101–111)
Creatinine, Ser: 0.97 mg/dL (ref 0.44–1.00)
GFR calc non Af Amer: 55 mL/min — ABNORMAL LOW (ref >60)
Glucose, Bld: 166 mg/dL — ABNORMAL HIGH (ref 65–99)
POTASSIUM: 3.8 mmol/L (ref 3.5–5.1)
SODIUM: 136 mmol/L (ref 135–145)
TOTAL PROTEIN: 6.8 g/dL (ref 6.5–8.1)

## 2016-09-16 LAB — LIPASE, BLOOD: Lipase: 26 U/L (ref 11–51)

## 2016-09-16 LAB — URINALYSIS, ROUTINE W REFLEX MICROSCOPIC
BILIRUBIN URINE: NEGATIVE
Glucose, UA: NEGATIVE mg/dL
Hgb urine dipstick: NEGATIVE
KETONES UR: 15 mg/dL — AB
Leukocytes, UA: NEGATIVE
NITRITE: NEGATIVE
PROTEIN: NEGATIVE mg/dL
Specific Gravity, Urine: 1.01 (ref 1.005–1.030)
pH: 8 (ref 5.0–8.0)

## 2016-09-16 LAB — TSH: TSH: 2.709 u[IU]/mL (ref 0.350–4.500)

## 2016-09-16 MED ORDER — FENTANYL CITRATE (PF) 100 MCG/2ML IJ SOLN
25.0000 ug | INTRAMUSCULAR | Status: DC | PRN
Start: 2016-09-16 — End: 2016-09-19
  Administered 2016-09-16: 50 ug via INTRAVENOUS
  Administered 2016-09-16 – 2016-09-19 (×3): 25 ug via INTRAVENOUS
  Filled 2016-09-16 (×3): qty 2

## 2016-09-16 MED ORDER — PHENOL 1.4 % MT LIQD
1.0000 | OROMUCOSAL | Status: DC | PRN
  Filled 2016-09-16: qty 177

## 2016-09-16 MED ORDER — ONDANSETRON HCL 4 MG/2ML IJ SOLN
4.0000 mg | Freq: Once | INTRAMUSCULAR | Status: AC
  Administered 2016-09-16: 4 mg via INTRAVENOUS
  Filled 2016-09-16: qty 2

## 2016-09-16 MED ORDER — PROCHLORPERAZINE EDISYLATE 5 MG/ML IJ SOLN: 10.0000 mg | Freq: Four times a day (QID) | INTRAMUSCULAR | Status: DC | PRN

## 2016-09-16 MED ORDER — ONDANSETRON HCL 4 MG PO TABS: 4.0000 mg | ORAL_TABLET | Freq: Four times a day (QID) | ORAL | Status: DC | PRN

## 2016-09-16 MED ORDER — SODIUM CHLORIDE 0.9 % IV SOLN: INTRAVENOUS | Status: DC

## 2016-09-16 MED ORDER — KCL IN DEXTROSE-NACL 20-5-0.9 MEQ/L-%-% IV SOLN
INTRAVENOUS | Status: DC
  Administered 2016-09-16: 20:00:00 via INTRAVENOUS

## 2016-09-16 MED ORDER — PROCHLORPERAZINE EDISYLATE 5 MG/ML IJ SOLN
10.0000 mg | Freq: Four times a day (QID) | INTRAMUSCULAR | Status: DC | PRN
  Administered 2016-09-16: 10 mg via INTRAVENOUS
  Filled 2016-09-16: qty 2

## 2016-09-16 MED ORDER — ONDANSETRON HCL 4 MG/2ML IJ SOLN
4.0000 mg | Freq: Four times a day (QID) | INTRAMUSCULAR | Status: DC | PRN
  Administered 2016-09-16: 4 mg via INTRAVENOUS
  Filled 2016-09-16: qty 2

## 2016-09-16 MED ORDER — FENTANYL CITRATE (PF) 100 MCG/2ML IJ SOLN
INTRAMUSCULAR | Status: AC
  Administered 2016-09-16: 50 ug via INTRAVENOUS
  Filled 2016-09-16: qty 2

## 2016-09-16 MED ORDER — FAMOTIDINE IN NACL 20-0.9 MG/50ML-% IV SOLN
20.0000 mg | Freq: Two times a day (BID) | INTRAVENOUS | Status: DC
  Administered 2016-09-16 – 2016-09-19 (×7): 20 mg via INTRAVENOUS
  Filled 2016-09-16 (×6): qty 50

## 2016-09-16 MED ORDER — HEPARIN SODIUM (PORCINE) 5000 UNIT/ML IJ SOLN
5000.0000 [IU] | Freq: Three times a day (TID) | INTRAMUSCULAR | Status: DC
  Administered 2016-09-16 – 2016-09-19 (×8): 5000 [IU] via SUBCUTANEOUS
  Filled 2016-09-16 (×9): qty 1

## 2016-09-16 MED ORDER — IOPAMIDOL (ISOVUE-300) INJECTION 61%
INTRAVENOUS | Status: AC
  Administered 2016-09-16: 02:00:00
  Filled 2016-09-16: qty 30

## 2016-09-16 MED ORDER — LIDOCAINE HCL 2 % EX GEL
1.0000 "application " | Freq: Once | CUTANEOUS | Status: AC
  Administered 2016-09-16: 1
  Filled 2016-09-16: qty 10

## 2016-09-16 MED ORDER — LIDOCAINE VISCOUS 2 % MT SOLN: 15.0000 mL | Freq: Once | OROMUCOSAL | Status: DC

## 2016-09-16 MED ORDER — LEVOTHYROXINE SODIUM 100 MCG IV SOLR
25.0000 ug | Freq: Every day | INTRAVENOUS | Status: DC
  Administered 2016-09-16 – 2016-09-19 (×4): 25 ug via INTRAVENOUS
  Filled 2016-09-16 (×6): qty 5

## 2016-09-16 MED ORDER — IOPAMIDOL (ISOVUE-300) INJECTION 61%
100.0000 mL | Freq: Once | INTRAVENOUS | Status: AC | PRN
  Administered 2016-09-16: 100 mL via INTRAVENOUS

## 2016-09-16 NOTE — Progress Notes (Signed)
Patient is a 80 year old woman with a history of hypothyroidism and abdominal hysterectomy, who was admitted this morning by Dr. Harvest Forest for abdominal pain and small bowel obstruction. Gen. surgeon, Dr. Rosana Hoes was consulted. She was briefly seen and examined. Her chart, vital signs, laboratory studies were reviewed. Agree with current management with additions below.  -We'll add dextrose to her IV fluids. -Her TSH was within normal limits. Continue IV Synthroid.

## 2016-09-16 NOTE — H&P (Signed)
History and Physical    Megan Conley [id number] DOB: 08-07-1936 DOA: 09/15/2016  Referring MD/NP/PA: Dr. Christy Gentles PCP: Wende Neighbors, MD  Patient coming from: Home  Chief Complaint: Abdominal pain  HPI: Megan Conley is a 80 y.o. female with medical history significant of hypothyroidism; who presents with complaints of abdominal pain. Symptoms started yesterday around noon. Patient complain of a waxing and waning lower abdominal pain that was achy to sharp in nature. Associated symptoms included nausea and vomiting anything that she ate. Patient tried over the counter nausea medicine without relief of symptoms. At baseline the patient is very active and has never had any symptoms like this previously the past. Denies any fever, chest pain, shortness of breath, diarrhea, or dysuria. Her past surgical history includes a open abdominal hysterectomy back in 1978.  ED Course: Upon admission into the emergency department patient was found to be afebrile with all other vital signs relatively within normal limits. Lab work revealed WBC 11.5, glucose 166, and all other laboratory relatively within normal limits. Urinalysis and the acute abdominal series were negative. CT scan of the abdomen and pelvis revealed a closed loop small bowel obstruction with possible inguinal hernia. Dr.  Rosana Hoes of general surgery was consulted and recommended admission and placement of NG tube. NG tube  was able to be placedwhile in the ED. [facility] called to admit.  Review of Systems: As per HPI otherwise 10 point review of systems negative.   Past Medical History:  Diagnosis Date  . Hypothyroidism     Past Surgical History:  Procedure Laterality Date  . ABDOMINAL HYSTERECTOMY    . cataract extraction both eyes    . COLONOSCOPY  06/02/2011   Procedure: COLONOSCOPY;  Surgeon: Rogene Houston, MD;  Location: [facility];  Service: Endoscopy;  Laterality: N/A;     reports that she has never smoked. She has never used  smokeless tobacco. She reports that she drinks about 2.4 oz of alcohol per week . She reports that she does not use drugs.  No Known Allergies  No family history on file.  Prior to Admission medications   Medication Sig Start Date End Date Taking? Authorizing Provider  calcium carbonate 1250 MG capsule Take 1,250 mg by mouth 2 (two) times daily with a meal.   Yes Historical Provider, MD  levothyroxine (SYNTHROID, LEVOTHROID) 50 MCG tablet Take 50 mcg by mouth daily.     Yes Historical Provider, MD  magnesium 30 MG tablet Take 30 mg by mouth 2 (two) times daily.   Yes Historical Provider, MD  Multiple Vitamin (MULTIVITAMIN) capsule Take 1 capsule by mouth daily.   Yes Historical Provider, MD  Omega-3 Fatty Acids (FISH OIL) 500 MG CAPS Take by mouth.     Yes Historical Provider, MD  Red Yeast Rice 600 MG CAPS Take by mouth.     Yes Historical Provider, MD    Physical Exam: Constitutional: NAD, calm, comfortable Vitals:   09/16/16 0100 09/16/16 0130 09/16/16 0200 09/16/16 0230  BP: 151/79 186/68 (!) 151/132 153/72  Pulse: 70 66 69 71  Resp: 15 19 21 10   Temp:      TempSrc:      SpO2: 100% 100% 100% 99%   Eyes: PERRL, lids and conjunctivae normal ENMT: Mucous membranes are dry. Blood present in the left nare. NGT in right nare. Posterior pharynx clear of any exudate or lesions. .  Neck: normal, supple, no masses, no thyromegaly Respiratory: clear to auscultation bilaterally, no wheezing,  no crackles. Normal respiratory effort. No accessory muscle use.  Cardiovascular: Regular rate and rhythm, no murmurs / rubs / gallops. No extremity edema. 2+ pedal pulses. No carotid bruits.  Abdomen:Tenderness to palpation of the lower abdominal quadrants, no masses palpated. No hepatosplenomegaly. Bowel sounds positive.  Musculoskeletal: no clubbing / cyanosis. No joint deformity upper and lower extremities. Good ROM, no contractures. Normal muscle tone.  Skin: no rashes, lesions, ulcers. No  induration Neurologic: CN 2-12 grossly intact. Sensation intact, DTR normal. Strength 5/5 in all 4.  Psychiatric: Normal judgment and insight. Alert and oriented x 3. Normal mood.     Labs on Admission: I have personally reviewed following labs and imaging studies  CBC:  Recent Labs Lab 09/16/16 0015  WBC 11.5*  NEUTROABS 10.2*  HGB 13.9  HCT 41.4  MCV 95.4  PLT 123XX123   Basic Metabolic Panel:  Recent Labs Lab 09/16/16 0015  NA 136  K 3.8  CL 103  CO2 23  GLUCOSE 166*  BUN 20  CREATININE 0.97  CALCIUM 9.5   GFR: CrCl cannot be calculated (Unknown ideal weight.). Liver Function Tests:  Recent Labs Lab 09/16/16 0015  AST 26  ALT 20  ALKPHOS 69  BILITOT 1.0  PROT 6.8  ALBUMIN 3.9    Recent Labs Lab 09/16/16 0015  LIPASE 26   No results for input(s): AMMONIA in the last 168 hours. Coagulation Profile: No results for input(s): INR, PROTIME in the last 168 hours. Cardiac Enzymes: No results for input(s): CKTOTAL, CKMB, CKMBINDEX, TROPONINI in the last 168 hours. BNP (last 3 results) No results for input(s): PROBNP in the last 8760 hours. HbA1C: No results for input(s): HGBA1C in the last 72 hours. CBG: No results for input(s): GLUCAP in the last 168 hours. Lipid Profile: No results for input(s): CHOL, HDL, LDLCALC, TRIG, CHOLHDL, LDLDIRECT in the last 72 hours. Thyroid Function Tests: No results for input(s): TSH, T4TOTAL, FREET4, T3FREE, THYROIDAB in the last 72 hours. Anemia Panel: No results for input(s): VITAMINB12, FOLATE, FERRITIN, TIBC, IRON, RETICCTPCT in the last 72 hours. Urine analysis:    Component Value Date/Time   COLORURINE YELLOW 09/16/2016 0110   APPEARANCEUR CLEAR 09/16/2016 0110   LABSPEC 1.010 09/16/2016 0110   PHURINE 8.0 09/16/2016 0110   GLUCOSEU NEGATIVE 09/16/2016 0110   HGBUR NEGATIVE 09/16/2016 0110   BILIRUBINUR NEGATIVE 09/16/2016 0110   KETONESUR 15 (A) 09/16/2016 0110   PROTEINUR NEGATIVE 09/16/2016 0110    NITRITE NEGATIVE 09/16/2016 0110   LEUKOCYTESUR NEGATIVE 09/16/2016 0110   Sepsis Labs: No results found for this or any previous visit (from the past 240 hour(s)).   Radiological Exams on Admission: Ct Abdomen Pelvis W Contrast  Result Date: 09/16/2016 CLINICAL DATA:  Initial evaluation for acute abdominal pain with eating. EXAM: CT ABDOMEN AND PELVIS WITH CONTRAST TECHNIQUE: Multidetector CT imaging of the abdomen and pelvis was performed using the standard protocol following bolus administration of intravenous contrast. CONTRAST:  137mL ISOVUE-300 IOPAMIDOL (ISOVUE-300) INJECTION 61% COMPARISON:  Prior radiograph from earlier the same day. FINDINGS: Lower chest: Mild atelectatic changes within the visualized lung bases. Visualized lungs are otherwise clear. Hepatobiliary: The liver demonstrates a normal contrast enhanced appearance. Gallbladder within normal limits. No biliary dilatation. Pancreas: Pancreas within normal limits. Spleen: Spleen within normal limits. Adrenals/Urinary Tract: The adrenal glands are normal. Kidneys equal in size with symmetric enhancement. No nephrolithiasis, hydronephrosis, or focal enhancing renal mass. Ureters within normal limits. Bladder within normal limits. Stomach/Bowel: Stomach mildly distended with oral contrast material  within the gastric lumen. There is a prominent loop of distal small bowel within the lower abdomen and measures up to 2.8 cm in diameter. Fecalization of enteric contents present within this loop of bowel. There is a focal transition point just distally within the right lower quadrant (series 2, image 58). Small bowel is decompressed for a short distance beyond this transition point. Distally, there is a short loop of small bowel which is mildly prominent measuring up to 2.5 cm with associated fecalization. There is an apparent second transition point just distally (series 2, image 56 on axial sequence, series 4, image 33 on coronal sequence).  Slight swirling of the mesenteric within this region. Findings are concerning for possible closed loop obstruction. Possible internal hernia may be present as well. Reactive edema with free fluid within this region. Ileum is decompressed distally to the ileocecal valve. Appendix not visualized. Colon within normal limits without acute inflammatory changes. Mild circumferential wall thickening about the descending colon felt to be related to incomplete distension. Mild sigmoid diverticulosis noted without acute diverticulitis. Vascular/Lymphatic: Moderate aorto bi-iliac atherosclerotic disease. No aneurysm. No pathologically enlarged intra-abdominal pelvic lymph nodes identified. Reproductive: Patient is status post hysterectomy. Other: Small volume free fluid within the lower abdomen and pelvis, likely reactive. No free intraperitoneal air. Musculoskeletal: Wedging deformity involving the T11 vertebral body appears to be chronic in nature. No acute osseous abnormality. No worrisome lytic or blastic osseous lesions. The the IMPRESSION: 1. Findings concerning for closed loop small-bowel obstruction within the right lower quadrant as detailed above. Possible internal hernia may be present as well. Associated mesenteric edema with small volume reactive free fluid within the lower abdomen and pelvis. 2. Mild sigmoid diverticulosis without evidence for acute diverticulitis. 3. Chronic T11 compression fracture. Electronically Signed   By: Jeannine Boga M.D.   On: 09/16/2016 03:10   Dg Abd Acute W/chest  Result Date: 09/16/2016 CLINICAL DATA:  Abdominal pain and vomiting, onset today. EXAM: DG ABDOMEN ACUTE W/ 1V CHEST COMPARISON:  None. FINDINGS: There is no evidence of dilated bowel loops or free intraperitoneal air. No radiopaque calculi or other significant radiographic abnormality is seen. Heart size and mediastinal contours are within normal limits. Both lungs are clear except for benign appearing apical  pleural thickening and fibrosis. IMPRESSION: Negative abdominal radiographs.  No acute cardiopulmonary disease. Electronically Signed   By: Andreas Newport M.D.   On: 09/16/2016 00:52    EKG: Independently reviewed. Sinus rhythm  Assessment/Plan Small bowel obstruction: Acute. Patient presents with complaints of nausea and vomiting and lower abdominal pain. Imaging reveals closed loop small bowel obstruction. General surgery consulted and recommended NGT placement. - Admit to MedSurg bed - NPO - Continue NGT  - IV fluids of NS at 100 ml/hr - Appreciate general surgery consultation for further recommendations  Nausea and vomiting - Zofran prn Nausea and vomiting  Leukocytosis elevated at 11.5 - check CBC in am  Hypothyroidism - Added on TSH - Converted levothyroxine from PO to IV   Gerd prophylaxis: Pepcid IV DVT prophylaxis: heparin   Code Status: Full  Family Communication: Discussed overall care with the patient and daughter present at bedside Disposition Plan: Likely discharge home once medically stable Consults called: Surgery Admission status: Inpatient   Norval Morton MD [facility] Pager [phone]  If 7PM-7AM, please contact night-coverage [email or web address] Password [email or web address]  09/16/2016, 3:58 AM

## 2016-09-16 NOTE — Progress Notes (Signed)
Patient declined IS at this time stating "she did not feel well, maybe tomorrow." RT did review the importance of the IS as well as reviewed deep breathing exercises.

## 2016-09-16 NOTE — Care Management Note (Signed)
Case Management Note  Patient Details  Name: Megan Conley MRN: [id number] Date of Birth: 1936-01-24  Subjective/Objective:     Adm with SBO, surgical consult pending.    Action/Plan: Will follow for needs.    Expected Discharge Date:       09/19/2016         Expected Discharge Plan:  Home/Self Care  In-House Referral:  NA  Discharge planning Services  CM Consult  Post Acute Care Choice:    Choice offered to:     DME Arranged:    DME Agency:     HH Arranged:    HH Agency:     Status of Service:  In process, will continue to follow  If discussed at Long Length of Stay Meetings, dates discussed:    Additional Comments:  Pericles Carmicheal, Chauncey Reading, RN 09/16/2016, 1:44 PM

## 2016-09-16 NOTE — Consult Note (Signed)
SURGICAL CONSULTATION NOTE (initial) - cpt: [phone]  HISTORY OF PRESENT ILLNESS (HPI):  80 y.o. female presented 40 years s/p abdominal hysterectomy (1978) with worsening intermittent waves of lower abdominal pain x 1.5 days that she describes as achy to sharp and associated with nausea and post-prandial emesis. She has not passed flatus or BM since 1 day prior to presentation, and she denies any prior similar symptoms. Since presenting at [facility] ED, patient reportedly received only 1 dose of narcotic pain medication early during her course in the ED, and she reported her pain after that remained mild and tolerable without need for further medication. CT was personally reviewed and discussed overnight with radiologist, Dr. Jeannine Boga, who agreed small bowel dilation and fecalization being similar both proximal and distal to the more proximal luminal narrowing increases the likelihood that this second luminal narrowing more likely represents peristalsis than a closed loop obstruction. Insertion of NG tube was advised and discussed with patient overnight, and patient reports substantial relief since NG tube insertion.   Surgery is consulted by ED physician Dr. Christy Gentles in this context for evaluation and management of SBO.  PAST MEDICAL HISTORY (PMH):  Past Medical History:  Diagnosis Date  . Hypothyroidism      PAST SURGICAL HISTORY ([place]):  Past Surgical History:  Procedure Laterality Date  . ABDOMINAL HYSTERECTOMY    . cataract extraction both eyes    . COLONOSCOPY  06/02/2011   Procedure: COLONOSCOPY;  Surgeon: Rogene Houston, MD;  Location: [facility];  Service: Endoscopy;  Laterality: N/A;     MEDICATIONS:  Prior to Admission medications   Medication Sig Start Date End Date Taking? Authorizing Provider  calcium carbonate 1250 MG capsule Take 1,250 mg by mouth 2 (two) times daily with a meal.   Yes Historical Provider, MD  levothyroxine (SYNTHROID, LEVOTHROID) 50 MCG tablet Take 50 mcg by  mouth daily.     Yes Historical Provider, MD  magnesium 30 MG tablet Take 30 mg by mouth 2 (two) times daily.   Yes Historical Provider, MD  Multiple Vitamin (MULTIVITAMIN) capsule Take 1 capsule by mouth daily.   Yes Historical Provider, MD  Omega-3 Fatty Acids (FISH OIL) 500 MG CAPS Take by mouth.     Yes Historical Provider, MD  Red Yeast Rice 600 MG CAPS Take by mouth.     Yes Historical Provider, MD     ALLERGIES:  No Known Allergies   SOCIAL HISTORY:  Social History   Social History  . Marital status: Married    Spouse name: N/A  . Number of children: N/A  . Years of education: N/A   Occupational History  . Not on file.   Social History Main Topics  . Smoking status: Never Smoker  . Smokeless tobacco: Never Used  . Alcohol use 2.4 oz/week    4 Glasses of wine per week  . Drug use: No  . Sexual activity: Not on file   Other Topics Concern  . Not on file   Social History Narrative  . No narrative on file    The patient currently resides (home / rehab facility / nursing home): Home  The patient normally is (ambulatory / bedbound): Ambulatory   FAMILY HISTORY:  History reviewed. No pertinent family history.   REVIEW OF SYSTEMS:  Constitutional: denies weight loss, fever, chills, or sweats  Eyes: denies any other vision changes, history of eye injury  ENT: denies sore throat, hearing problems  Respiratory: denies shortness of breath, wheezing  Cardiovascular: denies chest pain, palpitations  Gastrointestinal: abdominal pain, N/V, and bowel function as per HPI Genitourinary: denies burning with urination or urinary frequency Musculoskeletal: denies any other joint pains or cramps  Skin: denies any other rashes or skin discolorations  Neurological: denies any other headache, dizziness, weakness  Psychiatric: denies any other depression, anxiety   All other review of systems were negative   VITAL SIGNS:  Temp:  [97.8 F (36.6 C)-98.7 F (37.1 C)] 98.7 F  (37.1 C) (11/17 0547) Pulse Rate:  [63-76] 67 (11/17 0400) Resp:  [10-21] 19 (11/17 0400) BP: (142-186)/(65-132) 164/71 (11/17 0547) SpO2:  [95 %-100 %] 100 % (11/17 0547) Weight:  [56 kg (123 lb 8 oz)] 56 kg (123 lb 8 oz) (11/17 0547)     Height: 4\' 11"  (149.9 cm) Weight: 56 kg (123 lb 8 oz) BMI (Calculated): 25   INTAKE/OUTPUT:  This shift: No intake/output data recorded.  Last 2 shifts: @IOLAST2SHIFTS @   PHYSICAL EXAM:  Constitutional:  -- Normal body habitus  -- Awake, alert, and oriented x3  Eyes:  -- Pupils equally round and reactive to light  -- No scleral icterus  Ear, nose, and throat:  -- No jugular venous distension  Pulmonary:  -- No crackles  -- Equal breath sounds bilaterally -- Breathing non-labored at rest Cardiovascular:  -- S1, S2 present  -- No pericardial rubs Gastrointestinal:  -- Abdomen soft, mild lower abdominal tenderness to palpation, nondistended, no guarding/rebound  -- No abdominal masses appreciated, pulsatile or otherwise  Musculoskeletal and Integumentary:  -- Wounds or skin discoloration: None appreciated -- Extremities: B/L UE and LE FROM, hands and feet warm  Neurologic:  -- Motor function: intact and symmetric -- Sensation: intact and symmetric  Labs:  CBC:  Lab Results  Component Value Date   WBC 11.5 (H) 09/16/2016   RBC 4.34 09/16/2016   BMP:  Lab Results  Component Value Date   GLUCOSE 166 (H) 09/16/2016   CO2 23 09/16/2016   BUN 20 09/16/2016   CREATININE 0.97 09/16/2016   CALCIUM 9.5 09/16/2016     Imaging studies:  CT Abdomen and Pelvis with Contrast (09/16/2016) - personally reviewed and discussed overnight with radiologist, Dr. Jeannine Boga, who agreed small bowel dilation and fecalization being similar both proximal and distal to the more proximal luminal narrowing increases the likelihood this more proximal luminal narrowing in the middle of the dilated small intestine more likely represents peristalsis than a closed  loop obstruction  Stomach mildly distended with oral contrast material within the gastric lumen. There is a prominent loop of distal small bowel within the lower abdomen and measures up to 2.8 cm in diameter. Fecalization of enteric contents present within this loop of bowel. There is a focal transition point just distally within the right lower quadrant (series 2, image 58). Small bowel is decompressed for a short distance beyond this transition point. Distally, there is a short loop of small bowel which is mildly prominent measuring up to 2.5 cm with associated fecalization. There is an apparent second transition point just distally (series 2, image 56 on axial sequence, series 4, image 33 on coronal sequence). Slight swirling of the mesenteric within this region. Findings are concerning for possible closed loop obstruction. Possible internal hernia may be present as well. Reactive edema with free fluid within this region. Ileum is decompressed distally to the ileocecal valve. Appendix not visualized.  Colon within normal limits without acute inflammatory changes. Mild circumferential wall thickening about the descending colon felt  to be related to incomplete distension. Mild sigmoid diverticulosis noted without acute diverticulitis.  Assessment/Plan: (ICD-10's: K68.52) 80 y.o. female with small bowel obstruction, most likely attributable to post-surgical adhesive bands, complicated by pertinent comorbidities including hypothyroidism.   - NPO, IVF  - nasogastric decompression  - monitor abdominal exam and bowel function  - minimize vs avoid narcotics (denies pain since NG tube)  - anticipate symptomatic relief over 24 - 48 hours, will monitor for subsequent recovery of bowel function  - medical management of co-morbidities as per medical team  - ambulation encouraged, DVT prophylaxis  All of the above findings and recommendations were discussed with the patient and her daughter,  and all of patient's and her family's questions were answered to their expressed satisfaction.  Thank you for the opportunity to participate in this patient's care.   -- Marilynne Drivers Rosana Hoes, MD, [place]: [facility] Office: [phone]

## 2016-09-16 NOTE — ED Notes (Signed)
Advanced NG tube 10cm, significant increase in return.

## 2016-09-17 DIAGNOSIS — E038 Other specified hypothyroidism: Secondary | ICD-10-CM

## 2016-09-17 DIAGNOSIS — R030 Elevated blood-pressure reading, without diagnosis of hypertension: Secondary | ICD-10-CM

## 2016-09-17 DIAGNOSIS — R739 Hyperglycemia, unspecified: Secondary | ICD-10-CM | POA: Diagnosis present

## 2016-09-17 LAB — BASIC METABOLIC PANEL
ANION GAP: 5 (ref 5–15)
BUN: 22 mg/dL — ABNORMAL HIGH (ref 6–20)
CALCIUM: 8.4 mg/dL — AB (ref 8.9–10.3)
CO2: 25 mmol/L (ref 22–32)
Chloride: 110 mmol/L (ref 101–111)
Creatinine, Ser: 0.77 mg/dL (ref 0.44–1.00)
GLUCOSE: 148 mg/dL — AB (ref 65–99)
Potassium: 3.6 mmol/L (ref 3.5–5.1)
Sodium: 140 mmol/L (ref 135–145)

## 2016-09-17 LAB — CBC
HEMATOCRIT: 42.5 % (ref 36.0–46.0)
HEMOGLOBIN: 13.8 g/dL (ref 12.0–15.0)
MCH: 31.8 pg (ref 26.0–34.0)
MCHC: 32.5 g/dL (ref 30.0–36.0)
MCV: 97.9 fL (ref 78.0–100.0)
PLATELETS: 284 10*3/uL (ref 150–400)
RBC: 4.34 MIL/uL (ref 3.87–5.11)
RDW: 13.5 % (ref 11.5–15.5)
WBC: 5.3 10*3/uL (ref 4.0–10.5)

## 2016-09-17 MED ORDER — KCL IN DEXTROSE-NACL 20-5-0.45 MEQ/L-%-% IV SOLN
INTRAVENOUS | Status: DC
  Administered 2016-09-19: 11:00:00 via INTRAVENOUS

## 2016-09-17 NOTE — Progress Notes (Addendum)
PROGRESS NOTE    Megan Conley  [id number] DOB: [birth year] DOA: 09/15/2016 PCP: Wende Neighbors, MD    Brief Narrative:  Patient is a 80 year old woman with a history of hypothyroidism and abdominal hysterectomy, who presented to the ED on 09/15/16 with a chief complaint of abdominal pain. In the ED, she was afebrile and hemodynamically stable. CT of her abdomen and pelvis revealed a closed loop of small bowel obstruction with possible inguinal hernia. Her WBC was 11.5, glucose 166, and other laboratory studies were relatively unremarkable. She was admitted for further evaluation and management.   Assessment & Plan:   Principal Problem:   Small bowel obstruction Active Problems:   Hypothyroidism   Leukocytosis    1. Small bowel obstruction. The patient was made to be npo. Maintenance IV fluids were started. Dextrose was eventually added to the IV fluids due to her nothing by mouth status. NG tube was placed in the ED. General surgeon, Dr. Rosana Hoes was consulted. He agreed with NG tube insertion and bowel rest. IV Pepcid was started prophylactically. Fentanyl IV was ordered as needed for pain. Zofran was ordered as needed for nausea. Chloraseptic spray was ordered as needed for throat discomfort from the NG tube. -The NG tube is draining small amount of nonbilious fluid. -Per Dr. Rosana Hoes, the NG tube will be continued for decompression. Clear liquids will be started when she is passing flatness consistently. -Her leukocytosis has resolved.  Elevated blood pressure. The patient has no history of hypertension, but her blood pressure has been trending up. -We'll change IV fluids from D5 normal saline to D5 half-normal saline to decrease the solute load. -We'll order as needed IV hydralazine for systolic blood pressure greater than or equal to 175.  Hyperglycemia. The patient's venous glucose has been modestly elevated. This is likely from the dextrose and IV fluids, but will order  hemoglobin A1c for evaluation. We'll hold on sliding scale NovoLog unless her venous glucose is consistently above 150.  Hypothyroidism. Patient was restarted on Synthroid, but is being given IV. Her TSH was assessed and was within normal limits.   DVT prophylaxis: Subcutaneous heparin Code Status: Full code Family Communication: Discussed with her daughter Disposition Plan: Discharge when clinically appropriate   Consultants:   Gen. surgery, Dr. Rosana Hoes  Procedures:   NG tube insertion  Antimicrobials:   None    Subjective: Patient has no complaints of abdominal pain, nausea, and vomiting. She does acknowledge that the NG tube is uncomfortable but is using the "spray" as needed to help. She may have passed gas once.  Objective: Vitals:   09/16/16 1405 09/16/16 2056 09/17/16 0627 09/17/16 1500  BP: (!) 143/61 (!) 178/88 (!) 145/66 (!) 166/66  Pulse: 80 (!) 106 85 70  Resp: 16 16 16 18   Temp: 99.1 F (37.3 C) 98.6 F (37 C)  98.5 F (36.9 C)  TempSrc: Oral Oral Oral Oral  SpO2: 97% 98% 95% 97%  Weight:      Height:        Intake/Output Summary (Last 24 hours) at 09/17/16 1659 Last data filed at 09/17/16 1300  Gross per 24 hour  Intake           846.25 ml  Output                0 ml  Net           846.25 ml   Filed Weights   09/16/16 0547  Weight: 56 kg (123 lb 8  oz)    Examination:  General exam: Appears calm and comfortable  Respiratory system: Clear to auscultation. Respiratory effort normal. Cardiovascular system: S1 & S2 heard, RRR. No JVD, murmurs, rubs, gallops or clicks. No pedal edema. Gastrointestinal system: Abdomen is nondistended, soft and minimal distention. Bowel sounds heard but hypoactive. No organomegaly or masses felt. Normal bowel sounds heard. NG draining small amount of frothy fluid. Central nervous system: Alert and oriented. No focal neurological deficits. Extremities: Symmetric 5 x 5 power. Skin: No rashes, lesions or  ulcers Psychiatry: Judgement and insight appear normal. Mood & affect appropriate.     Data Reviewed: I have personally reviewed following labs and imaging studies  CBC:  Recent Labs Lab 09/16/16 0015 09/17/16 0717  WBC 11.5* 5.3  NEUTROABS 10.2*  --   HGB 13.9 13.8  HCT 41.4 42.5  MCV 95.4 97.9  PLT 288 [id number]   Basic Metabolic Panel:  Recent Labs Lab 09/16/16 0015 09/17/16 0717  NA 136 140  K 3.8 3.6  CL 103 110  CO2 23 25  GLUCOSE 166* 148*  BUN 20 22*  CREATININE 0.97 0.77  CALCIUM 9.5 8.4*   GFR: Estimated Creatinine Clearance: 44.9 mL/min (by C-G formula based on SCr of 0.77 mg/dL). Liver Function Tests:  Recent Labs Lab 09/16/16 0015  AST 26  ALT 20  ALKPHOS 69  BILITOT 1.0  PROT 6.8  ALBUMIN 3.9    Recent Labs Lab 09/16/16 0015  LIPASE 26   No results for input(s): AMMONIA in the last 168 hours. Coagulation Profile: No results for input(s): INR, PROTIME in the last 168 hours. Cardiac Enzymes: No results for input(s): CKTOTAL, CKMB, CKMBINDEX, TROPONINI in the last 168 hours. BNP (last 3 results) No results for input(s): PROBNP in the last 8760 hours. HbA1C: No results for input(s): HGBA1C in the last 72 hours. CBG: No results for input(s): GLUCAP in the last 168 hours. Lipid Profile: No results for input(s): CHOL, HDL, LDLCALC, TRIG, CHOLHDL, LDLDIRECT in the last 72 hours. Thyroid Function Tests:  Recent Labs  09/16/16 0015  TSH 2.709   Anemia Panel: No results for input(s): VITAMINB12, FOLATE, FERRITIN, TIBC, IRON, RETICCTPCT in the last 72 hours. Sepsis Labs: No results for input(s): PROCALCITON, LATICACIDVEN in the last 168 hours.  No results found for this or any previous visit (from the past 240 hour(s)).       Radiology Studies: Ct Abdomen Pelvis W Contrast  Result Date: 09/16/2016 CLINICAL DATA:  Initial evaluation for acute abdominal pain with eating. EXAM: CT ABDOMEN AND PELVIS WITH CONTRAST TECHNIQUE:  Multidetector CT imaging of the abdomen and pelvis was performed using the standard protocol following bolus administration of intravenous contrast. CONTRAST:  131mL ISOVUE-300 IOPAMIDOL (ISOVUE-300) INJECTION 61% COMPARISON:  Prior radiograph from earlier the same day. FINDINGS: Lower chest: Mild atelectatic changes within the visualized lung bases. Visualized lungs are otherwise clear. Hepatobiliary: The liver demonstrates a normal contrast enhanced appearance. Gallbladder within normal limits. No biliary dilatation. Pancreas: Pancreas within normal limits. Spleen: Spleen within normal limits. Adrenals/Urinary Tract: The adrenal glands are normal. Kidneys equal in size with symmetric enhancement. No nephrolithiasis, hydronephrosis, or focal enhancing renal mass. Ureters within normal limits. Bladder within normal limits. Stomach/Bowel: Stomach mildly distended with oral contrast material within the gastric lumen. There is a prominent loop of distal small bowel within the lower abdomen and measures up to 2.8 cm in diameter. Fecalization of enteric contents present within this loop of bowel. There is a focal transition point just  distally within the right lower quadrant (series 2, image 58). Small bowel is decompressed for a short distance beyond this transition point. Distally, there is a short loop of small bowel which is mildly prominent measuring up to 2.5 cm with associated fecalization. There is an apparent second transition point just distally (series 2, image 56 on axial sequence, series 4, image 33 on coronal sequence). Slight swirling of the mesenteric within this region. Findings are concerning for possible closed loop obstruction. Possible internal hernia may be present as well. Reactive edema with free fluid within this region. Ileum is decompressed distally to the ileocecal valve. Appendix not visualized. Colon within normal limits without acute inflammatory changes. Mild circumferential wall  thickening about the descending colon felt to be related to incomplete distension. Mild sigmoid diverticulosis noted without acute diverticulitis. Vascular/Lymphatic: Moderate aorto bi-iliac atherosclerotic disease. No aneurysm. No pathologically enlarged intra-abdominal pelvic lymph nodes identified. Reproductive: Patient is status post hysterectomy. Other: Small volume free fluid within the lower abdomen and pelvis, likely reactive. No free intraperitoneal air. Musculoskeletal: Wedging deformity involving the T11 vertebral body appears to be chronic in nature. No acute osseous abnormality. No worrisome lytic or blastic osseous lesions. The the IMPRESSION: 1. Findings concerning for closed loop small-bowel obstruction within the right lower quadrant as detailed above. Possible internal hernia may be present as well. Associated mesenteric edema with small volume reactive free fluid within the lower abdomen and pelvis. 2. Mild sigmoid diverticulosis without evidence for acute diverticulitis. 3. Chronic T11 compression fracture. Electronically Signed   By: Jeannine Boga M.D.   On: 09/16/2016 03:10   Dg Abd Acute W/chest  Result Date: 09/16/2016 CLINICAL DATA:  Abdominal pain and vomiting, onset today. EXAM: DG ABDOMEN ACUTE W/ 1V CHEST COMPARISON:  None. FINDINGS: There is no evidence of dilated bowel loops or free intraperitoneal air. No radiopaque calculi or other significant radiographic abnormality is seen. Heart size and mediastinal contours are within normal limits. Both lungs are clear except for benign appearing apical pleural thickening and fibrosis. IMPRESSION: Negative abdominal radiographs.  No acute cardiopulmonary disease. Electronically Signed   By: Andreas Newport M.D.   On: 09/16/2016 00:52   Dg Abd Portable 1v  Result Date: 09/16/2016 CLINICAL DATA:  Nasogastric tube placement EXAM: PORTABLE ABDOMEN - 1 VIEW COMPARISON:  09/16/2016 CT FINDINGS: The nasogastric tube extends to the  EG junction. It should be advanced at least 10 cm for optimal placement. There is enteric contrast throughout small bowel, from the recent CT. Iodinated contrast within the renal collecting systems and urinary bladder also noted. IMPRESSION: Nasogastric tube does not reach the stomach. It terminates at the EG junction. Recommend advancement at least 10 cm. These results will be called to the ordering clinician or representative by the Radiologist Assistant, and communication documented in the PACS or zVision Dashboard. Electronically Signed   By: Andreas Newport M.D.   On: 09/16/2016 05:06        Scheduled Meds: . famotidine (PEPCID) IV  20 mg Intravenous Q12H  . heparin  5,000 Units Subcutaneous Q8H  . levothyroxine  25 mcg Intravenous Daily   Continuous Infusions: . dextrose 5 % and 0.9 % NaCl with KCl 20 mEq/L 75 mL/hr at 09/16/16 1941     LOS: 1 day    Time spent: 1 minutes    Rexene Alberts, MD [facility] Pager [phone]  If 7PM-7AM, please contact night-coverage [email or web address] Password [email or web address] 09/17/2016, 4:59 PM

## 2016-09-17 NOTE — Progress Notes (Signed)
SURGICAL PROGRESS NOTE (cpt [phone])  Hospital Day(s): 1.   Post op day(s):  Megan Conley   Interval History: Patient seen and examined, reports a brief episode of nausea with anxiety overnight, followed by <100 mL NGT drainage (otherwise draining very little) and otherwise near-complete resolution of abdominal pain with which she presented. Patient also reports a single episode of flatus, denies CP, SOB, or fever/chills..  Review of Systems:  Constitutional: denies fever, chills  HEENT: denies cough or congestion  Respiratory: denies any shortness of breath  Cardiovascular: denies chest pain or palpitations  Gastrointestinal: N/V, abdominal pain, and bowel function as per HPI  Musculoskeletal: denies pain, decreased motor or sensation  Neurological: denies HA or vision/hearing changes   Vital signs in last 24 hours: [min-max] current  Temp:  [98.6 F (37 C)-99.1 F (37.3 C)] 98.6 F (37 C) (11/17 2056) Pulse Rate:  [80-106] 85 (11/18 0627) Resp:  [16] 16 (11/18 0627) BP: (143-178)/(61-88) 145/66 (11/18 0627) SpO2:  [95 %-98 %] 95 % (11/18 0627)     Height: 4\' 11"  (149.9 cm) Weight: 56 kg (123 lb 8 oz) BMI (Calculated): 25   Intake/Output this shift:  No intake/output data recorded.   Intake/Output last 2 shifts:  @IOLAST2SHIFTS @   Physical Exam:  Constitutional: alert, cooperative and no distress  HENT: normocephalic without obvious abnormality  Eyes: PERRL, EOM's grossly intact and symmetric  Neuro: CN II - XII grossly intact and symmetric without deficit  Respiratory: breathing non-labored at rest  Cardiovascular: regular rate and sinus rhythm  Gastrointestinal: soft, non-tender, and ?mildly distended (baseline unclear) Musculoskeletal: UE and LE FROM, no edema or wounds, motor and sensation grossly intact, NT   Labs:  CBC:  Lab Results  Component Value Date   WBC 5.3 09/17/2016   RBC 4.34 09/17/2016   BMP:  Lab Results  Component Value Date   GLUCOSE 148 (H) 09/17/2016    CO2 25 09/17/2016   BUN 22 (H) 09/17/2016   CREATININE 0.77 09/17/2016   CALCIUM 8.4 (L) 09/17/2016     Imaging studies: No new pertinent imaging studies   Assessment/Plan: (ICD-10's: K90.52) 80 y.o. female with small bowel obstruction, most likely attributable to post-surgical adhesive bands, complicated by pertinent comorbidities including hypothyroidism.              - NPO, IVF             - nasogastric decompression             - monitor abdominal exam and bowel function             - minimize vs avoid narcotics (denies pain since NG tube placed)             - anticipate symptomatic relief over 24 - 48 hours, will monitor for subsequent recovery of bowel function  - will remove NG tube and start clear liquids diet when starts consistently passing flatus             - medical management of co-morbidities as per medical team             - ambulation encouraged, DVT prophylaxis  All of the above findings and recommendations were discussed with the patient and her daughter, and all of patient's and her family's questions were answered to their expressed satisfaction.  Thank you for the opportunity to participate in this patient's care.  -- Marilynne Drivers Rosana Hoes, MD, [place]: [facility] Office: [phone]

## 2016-09-17 NOTE — Progress Notes (Signed)
Pt/Daughter stated that MD wanted her to ambulate.  The patients NGT was clamped approx. 1130 and she got OOB and performed ADL's, ambulated in the hall, and sat up in the chair. The NGT is intact and in place.  Patient has tolerated the the activity without difficulty.

## 2016-09-18 LAB — BASIC METABOLIC PANEL
Anion gap: 3 — ABNORMAL LOW (ref 5–15)
BUN: 19 mg/dL (ref 6–20)
CHLORIDE: 111 mmol/L (ref 101–111)
CO2: 26 mmol/L (ref 22–32)
Calcium: 8.3 mg/dL — ABNORMAL LOW (ref 8.9–10.3)
Creatinine, Ser: 0.65 mg/dL (ref 0.44–1.00)
GFR calc Af Amer: >60 mL/min (ref >60)
GFR calc non Af Amer: >60 mL/min (ref >60)
GLUCOSE: 123 mg/dL — AB (ref 65–99)
POTASSIUM: 3.7 mmol/L (ref 3.5–5.1)
SODIUM: 140 mmol/L (ref 135–145)

## 2016-09-18 NOTE — Progress Notes (Addendum)
PROGRESS NOTE    Megan Conley  [id number] DOB: [birth year] DOA: 09/15/2016 PCP: Wende Neighbors, MD    Brief Narrative:  Patient is a 80 year old woman with a history of hypothyroidism and abdominal hysterectomy, who presented to the ED on 09/15/16 with a chief complaint of abdominal pain. In the ED, she was afebrile and hemodynamically stable. CT of her abdomen and pelvis revealed a closed loop of small bowel obstruction with possible inguinal hernia. Her WBC was 11.5, glucose 166, and other laboratory studies were relatively unremarkable. She was admitted for further evaluation and management.   Assessment & Plan:   Principal Problem:   Small bowel obstruction Active Problems:   Hypothyroidism   Leukocytosis   Elevated blood pressure reading without diagnosis of hypertension   Hyperglycemia    1. Small bowel obstruction. The patient was made to be npo. Maintenance IV fluids were started. Dextrose was eventually added to the IV fluids due to her nothing by mouth status. NG tube was placed in the ED. General surgeon, Dr. Rosana Hoes was consulted. He agreed with NG tube insertion and bowel rest. IV Pepcid was started prophylactically. Fentanyl IV was ordered as needed for pain. Zofran was ordered as needed for nausea. Chloraseptic spray was ordered as needed for throat discomfort from the NG tube. -Patient began to pass flatus more overnight. She denies having a bowel movement yet. -The NG tube was discontinued by Dr. Rosana Hoes on today, 09/18/16. Clear liquids were started.  -Her leukocytosis has resolved.  Elevated blood pressure. The patient has no history of hypertension, but her blood pressure had trending up. -IV fluids changed to half-normal saline with dextrose to decrease the sodium load. -As needed IV hydralazine was ordered for an SBP of 175 or greater. -Her blood pressure has improved.  Hyperglycemia. The patient's venous glucose has been modestly elevated. This is likely from  the dextrose and IV fluids, but will order hemoglobin A1c for evaluation. We'll hold on sliding scale NovoLog unless her venous glucose is consistently above 150.  Hypothyroidism. Patient was restarted on Synthroid, but was being given IV she was nothing by mouth. It was changed back to by mouth following the discontinuation of the NG tube. Her TSH was assessed and was within normal limits.   DVT prophylaxis: Subcutaneous heparin Code Status: Full code Family Communication: Discussed with her daughter Disposition Plan: Discharge when clinically appropriate   Consultants:   Gen. surgery, Dr. Rosana Hoes  Procedures:   NG tube insertion  Antimicrobials:   None    Subjective: Patient reports passing gas several times overnight and this morning. Prior to the NG tube being removed, she complained of irritation and I informed her that a lubricant would be ordered if the NG tube was not removed today.  Objective: Vitals:   09/17/16 0627 09/17/16 1500 09/17/16 2117 09/18/16 0615  BP: (!) 145/66 (!) 166/66 139/79 (!) 143/60  Pulse: 85 70 70 73  Resp: 16 18 16 18   Temp:  98.5 F (36.9 C) 99.1 F (37.3 C) 98.8 F (37.1 C)  TempSrc: Oral Oral Oral Oral  SpO2: 95% 97% 96% 95%  Weight:      Height:        Intake/Output Summary (Last 24 hours) at 09/18/16 1537 Last data filed at 09/17/16 2122  Gross per 24 hour  Intake                0 ml  Output  200 ml  Net             -200 ml   Filed Weights   09/16/16 0547  Weight: 56 kg (123 lb 8 oz)    Examination:  General exam: Appears calm and comfortable  Respiratory system: Clear to auscultation. Respiratory effort normal. Cardiovascular system: S1 & S2 heard, RRR. No JVD, murmurs, rubs, gallops or clicks. No pedal edema. Gastrointestinal system: Abdomen is nondistended, soft. Bowel sounds heard but hypoactive. No organomegaly or masses felt. Normal bowel sounds heard. Central nervous system: Alert and oriented. No  focal neurological deficits. Extremities: Symmetric 5 x 5 power. Skin: No rashes, lesions or ulcers Psychiatry: Judgement and insight appear normal. Mood & affect appropriate.     Data Reviewed: I have personally reviewed following labs and imaging studies  CBC:  Recent Labs Lab 09/16/16 0015 09/17/16 0717  WBC 11.5* 5.3  NEUTROABS 10.2*  --   HGB 13.9 13.8  HCT 41.4 42.5  MCV 95.4 97.9  PLT 288 [id number]   Basic Metabolic Panel:  Recent Labs Lab 09/16/16 0015 09/17/16 0717 09/18/16 0735  NA 136 140 140  K 3.8 3.6 3.7  CL 103 110 111  CO2 23 25 26   GLUCOSE 166* 148* 123*  BUN 20 22* 19  CREATININE 0.97 0.77 0.65  CALCIUM 9.5 8.4* 8.3*   GFR: Estimated Creatinine Clearance: 44.9 mL/min (by C-G formula based on SCr of 0.65 mg/dL). Liver Function Tests:  Recent Labs Lab 09/16/16 0015  AST 26  ALT 20  ALKPHOS 69  BILITOT 1.0  PROT 6.8  ALBUMIN 3.9    Recent Labs Lab 09/16/16 0015  LIPASE 26   No results for input(s): AMMONIA in the last 168 hours. Coagulation Profile: No results for input(s): INR, PROTIME in the last 168 hours. Cardiac Enzymes: No results for input(s): CKTOTAL, CKMB, CKMBINDEX, TROPONINI in the last 168 hours. BNP (last 3 results) No results for input(s): PROBNP in the last 8760 hours. HbA1C: No results for input(s): HGBA1C in the last 72 hours. CBG: No results for input(s): GLUCAP in the last 168 hours. Lipid Profile: No results for input(s): CHOL, HDL, LDLCALC, TRIG, CHOLHDL, LDLDIRECT in the last 72 hours. Thyroid Function Tests:  Recent Labs  09/16/16 0015  TSH 2.709   Anemia Panel: No results for input(s): VITAMINB12, FOLATE, FERRITIN, TIBC, IRON, RETICCTPCT in the last 72 hours. Sepsis Labs: No results for input(s): PROCALCITON, LATICACIDVEN in the last 168 hours.  No results found for this or any previous visit (from the past 240 hour(s)).       Radiology Studies: No results found.      Scheduled Meds: .  famotidine (PEPCID) IV  20 mg Intravenous Q12H  . heparin  5,000 Units Subcutaneous Q8H  . levothyroxine  25 mcg Intravenous Daily   Continuous Infusions: . dextrose 5 % and 0.45 % NaCl with KCl 20 mEq/L       LOS: 2 days    Time spent: 30 minutes    Rexene Alberts, MD [facility] Pager [phone]  If 7PM-7AM, please contact night-coverage [email or web address] Password [facility] 09/18/2016, 3:37 PM

## 2016-09-18 NOTE — Progress Notes (Signed)
SURGICAL PROGRESS NOTE (cpt [phone])  Hospital Day(s): 2.   Post op day(s):  Marland Kitchen   Interval History: Patient seen and examined, no acute events or new complaints overnight. Patient reports she started passing flatus late last night and a BM this morning, denies abdominal pain, N/V, fever/chills, CP, or SOB.  Review of Systems:  Constitutional: denies fever, chills  HEENT: denies cough or congestion  Respiratory: denies any shortness of breath  Cardiovascular: denies chest pain or palpitations  Gastrointestinal: abdominal pain, N/V, and bowel function as per HPI Musculoskeletal: denies pain, decreased motor or sensation  Neurological: denies HA or vision/hearing changes   Vital signs in last 24 hours: [min-max] current  Temp:  [98.5 F (36.9 C)-99.1 F (37.3 C)] 98.8 F (37.1 C) (11/19 0615) Pulse Rate:  [70-73] 73 (11/19 0615) Resp:  [16-18] 18 (11/19 0615) BP: (139-166)/(60-79) 143/60 (11/19 0615) SpO2:  [95 %-97 %] 95 % (11/19 0615)     Height: 4\' 11"  (149.9 cm) Weight: 56 kg (123 lb 8 oz) BMI (Calculated): 25   Intake/Output this shift:  No intake/output data recorded.   Intake/Output last 2 shifts:  @IOLAST2SHIFTS @   Physical Exam:  Constitutional: alert, cooperative and no distress  HENT: normocephalic without obvious abnormality  Eyes: PERRL, EOM's grossly intact and symmetric  Neuro: CN II - XII grossly intact and symmetric without deficit  Respiratory: breathing non-labored at rest  Cardiovascular: regular rate and sinus rhythm  Gastrointestinal: soft, non-tender, and non-distended  Musculoskeletal: UE and LE FROM, no edema, motor and sensation grossly intact, NT   Labs:  CBC:  Lab Results  Component Value Date   WBC 5.3 09/17/2016   RBC 4.34 09/17/2016   BMP:  Lab Results  Component Value Date   GLUCOSE 123 (H) 09/18/2016   CO2 26 09/18/2016   BUN 19 09/18/2016   CREATININE 0.65 09/18/2016   CALCIUM 8.3 (L) 09/18/2016     Imaging studies: No new  pertinent imaging studies   Assessment/Plan: (ICD-10's: K56.52) 79 y.o.femalewith resolving small bowel obstruction, most likely attributable to post-surgical adhesive bands, complicated by pertinent comorbidities including hypothyroidism.  - removed NG tube, ordered clear liquids - monitor abdominal exam and bowel function - will likely advance diet tomorrow if tolerating clear liquids with improving bowel function - medical management of co-morbidities as per medical team - ambulation encouraged, DVT prophylaxis  - anticipate discharge planning  All of the above findings and recommendations were discussed with the patient and her daughter, and all of patient's and her family's questions were answered to their expressed satisfaction.  Thank you for the opportunity to participate in this patient's care.  -- Marilynne Drivers Rosana Hoes, MD, [place]: [facility] Office: [phone]

## 2016-09-19 LAB — HEMOGLOBIN A1C
Hgb A1c MFr Bld: 5.5 % (ref 4.8–5.6)
Mean Plasma Glucose: 111 mg/dL

## 2016-09-19 MED ORDER — ONDANSETRON HCL 4 MG PO TABS: 4.0000 mg | ORAL_TABLET | Freq: Three times a day (TID) | ORAL | 0 refills | Status: DC | PRN

## 2016-09-19 MED ORDER — SENNOSIDES-DOCUSATE SODIUM 8.6-50 MG PO TABS: 2.0000 | ORAL_TABLET | Freq: Every day | ORAL | Status: DC

## 2016-09-19 NOTE — Discharge Summary (Signed)
Physician Discharge Summary  ALONZO MCLENNAN [id number] DOB: [birth year] DOA: 09/15/2016  PCP: Wende Neighbors, MD  Admit date: 09/15/2016 Discharge date: 09/19/2016  Time spent: Greater than 30 minutes  Recommendations for Outpatient Follow-up:  1. Patient was instructed to follow-up with general surgeon, Dr. Rosana Hoes only if needed.   Discharge Diagnoses:  1. Small bowel obstruction, managed nonsurgically. Resolved. 2. Hypothyroidism. 3. Episodic elevated blood pressure without diagnosis of hypertension. 4. Hyperglycemia. Hemoglobin A1c was within normal limits at 5.5.   Discharge Condition: Improved  Diet recommendation: Soft/heart healthy.  Filed Weights   09/16/16 0547  Weight: 56 kg (123 lb 8 oz)    History of present illness:  Patient is a 80 year old woman with a history of hypothyroidism and abdominal hysterectomy, who presented to the ED on 09/15/16 with a chief complaint of abdominal pain. In the ED, she was afebrile and hemodynamically stable. CT of her abdomen and pelvis revealed a closed loop of small bowel obstruction with possible inguinal hernia. Her WBC was 11.5, glucose 166, and her other laboratory studies were relatively unremarkable. She was admitted for further evaluation and management.  Hospital Course:  1. Small bowel obstruction. The patient was made to be npo. Maintenance IV fluids were started. Dextrose was eventually added to the IV fluids due to her nothing by mouth status. NG tube was placed in the ED. General surgeon, Dr. Rosana Hoes was consulted. He agreed with NG tube insertion and bowel rest. IV Pepcid was started prophylactically. Fentanyl IV was ordered as needed for pain. Zofran was ordered as needed for nausea. Chloraseptic spray was ordered as needed for throat discomfort from the NG tube.The patient began passing flatus. Clear liquids were started. Eventually she had 2 small bowel movements. Her diet was then advanced to a soft diet which she tolerated  well. -Her small bowel obstruction resolved clinically without surgical intervention. Her leukocytosis resolved without antibiotic therapy. -She was instructed to take a daily laxative/stool softener to avoid constipation. She voiced understanding.  Elevated blood pressure. The patient has no history of hypertension, but her blood pressure had  started trending up -IV fluids changed to half-normal saline with dextrose to decrease the sodium load. -As needed IV hydralazine was ordered for an SBP of 175 or greater. -Her blood pressure improved.  Hyperglycemia. The patient's venous glucose was mildly elevated. It was likely from the dextrose in the IV fluids. Nevertheless, hemoglobin A1c was ordered and it was within normal limits.  Hypothyroidism. Patient was restarted on Synthroid. Her TSH was assessed and was within normal limits.  Procedures:  NG tube insertion  Consultations:  Gen. surgeon, Dr. Rosana Hoes  Discharge Exam: Vitals:   09/19/16 0705 09/19/16 1503  BP: (!) 155/65 137/60  Pulse: 80 69  Resp: 16 20  Temp: 99.3 F (37.4 C) 98.7 F (37.1 C)    General: Pleasant 80 year old woman in no acute distress. Cardiovascular: S1, S2, no murmurs rubs or gallops. No pedal edema.  Respiratory: Decreased breath sounds bases otherwise clear anteriorly. Breathing nonlabored. Abdomen: Positive bowel sounds, nontender; no distention.  Discharge Instructions   Discharge Instructions    Diet - low sodium heart healthy    Complete by:  As directed    Increase activity slowly    Complete by:  As directed      Current Discharge Medication List    START taking these medications   Details  ondansetron (ZOFRAN) 4 MG tablet Take 1 tablet (4 mg total) by mouth every 8 (eight) hours as  needed for nausea or vomiting. Qty: 20 tablet, Refills: 0    senna-docusate (SENOKOT S) 8.6-50 MG tablet Take 2 tablets by mouth at bedtime.      CONTINUE these medications which have NOT CHANGED    Details  calcium carbonate 1250 MG capsule Take 1,250 mg by mouth 2 (two) times daily with a meal.    levothyroxine (SYNTHROID, LEVOTHROID) 50 MCG tablet Take 50 mcg by mouth daily.      magnesium 30 MG tablet Take 30 mg by mouth 2 (two) times daily.    Multiple Vitamin (MULTIVITAMIN) capsule Take 1 capsule by mouth daily.    Omega-3 Fatty Acids (FISH OIL) 500 MG CAPS Take by mouth.      Red Yeast Rice 600 MG CAPS Take by mouth.      latanoprost (XALATAN) 0.005 % ophthalmic solution Place 1 drop into both eyes at bedtime.       No Known Allergies Follow-up Information    Vickie Epley, MD Follow up.   Specialty:  General Surgery Why:  No need for scheduled surgical follow-up, but please call if any surgical questions. Happy Thanksgiving! Contact information: [place] [address] [phone]        Wende Neighbors, MD Follow up in 1 week(s).   Specialty:  Internal Medicine Contact information: [place] [address] [phone]            The results of significant diagnostics from this hospitalization (including imaging, microbiology, ancillary and laboratory) are listed below for reference.    Significant Diagnostic Studies: Ct Abdomen Pelvis W Contrast  Result Date: 09/16/2016 CLINICAL DATA:  Initial evaluation for acute abdominal pain with eating. EXAM: CT ABDOMEN AND PELVIS WITH CONTRAST TECHNIQUE: Multidetector CT imaging of the abdomen and pelvis was performed using the standard protocol following bolus administration of intravenous contrast. CONTRAST:  167mL ISOVUE-300 IOPAMIDOL (ISOVUE-300) INJECTION 61% COMPARISON:  Prior radiograph from earlier the same day. FINDINGS: Lower chest: Mild atelectatic changes within the visualized lung bases. Visualized lungs are otherwise clear. Hepatobiliary: The liver demonstrates a normal contrast enhanced appearance. Gallbladder within normal limits. No biliary dilatation.  Pancreas: Pancreas within normal limits. Spleen: Spleen within normal limits. Adrenals/Urinary Tract: The adrenal glands are normal. Kidneys equal in size with symmetric enhancement. No nephrolithiasis, hydronephrosis, or focal enhancing renal mass. Ureters within normal limits. Bladder within normal limits. Stomach/Bowel: Stomach mildly distended with oral contrast material within the gastric lumen. There is a prominent loop of distal small bowel within the lower abdomen and measures up to 2.8 cm in diameter. Fecalization of enteric contents present within this loop of bowel. There is a focal transition point just distally within the right lower quadrant (series 2, image 58). Small bowel is decompressed for a short distance beyond this transition point. Distally, there is a short loop of small bowel which is mildly prominent measuring up to 2.5 cm with associated fecalization. There is an apparent second transition point just distally (series 2, image 56 on axial sequence, series 4, image 33 on coronal sequence). Slight swirling of the mesenteric within this region. Findings are concerning for possible closed loop obstruction. Possible internal hernia may be present as well. Reactive edema with free fluid within this region. Ileum is decompressed distally to the ileocecal valve. Appendix not visualized. Colon within normal limits without acute inflammatory changes. Mild circumferential wall thickening about the descending colon felt to be related to incomplete distension. Mild sigmoid diverticulosis noted without acute  diverticulitis. Vascular/Lymphatic: Moderate aorto bi-iliac atherosclerotic disease. No aneurysm. No pathologically enlarged intra-abdominal pelvic lymph nodes identified. Reproductive: Patient is status post hysterectomy. Other: Small volume free fluid within the lower abdomen and pelvis, likely reactive. No free intraperitoneal air. Musculoskeletal: Wedging deformity involving the T11 vertebral  body appears to be chronic in nature. No acute osseous abnormality. No worrisome lytic or blastic osseous lesions. The the IMPRESSION: 1. Findings concerning for closed loop small-bowel obstruction within the right lower quadrant as detailed above. Possible internal hernia may be present as well. Associated mesenteric edema with small volume reactive free fluid within the lower abdomen and pelvis. 2. Mild sigmoid diverticulosis without evidence for acute diverticulitis. 3. Chronic T11 compression fracture. Electronically Signed   By: Jeannine Boga M.D.   On: 09/16/2016 03:10   Dg Abd Acute W/chest  Result Date: 09/16/2016 CLINICAL DATA:  Abdominal pain and vomiting, onset today. EXAM: DG ABDOMEN ACUTE W/ 1V CHEST COMPARISON:  None. FINDINGS: There is no evidence of dilated bowel loops or free intraperitoneal air. No radiopaque calculi or other significant radiographic abnormality is seen. Heart size and mediastinal contours are within normal limits. Both lungs are clear except for benign appearing apical pleural thickening and fibrosis. IMPRESSION: Negative abdominal radiographs.  No acute cardiopulmonary disease. Electronically Signed   By: Andreas Newport M.D.   On: 09/16/2016 00:52   Dg Abd Portable 1v  Result Date: 09/16/2016 CLINICAL DATA:  Nasogastric tube placement EXAM: PORTABLE ABDOMEN - 1 VIEW COMPARISON:  09/16/2016 CT FINDINGS: The nasogastric tube extends to the EG junction. It should be advanced at least 10 cm for optimal placement. There is enteric contrast throughout small bowel, from the recent CT. Iodinated contrast within the renal collecting systems and urinary bladder also noted. IMPRESSION: Nasogastric tube does not reach the stomach. It terminates at the EG junction. Recommend advancement at least 10 cm. These results will be called to the ordering clinician or representative by the Radiologist Assistant, and communication documented in the PACS or zVision Dashboard.  Electronically Signed   By: Andreas Newport M.D.   On: 09/16/2016 05:06   Mm Screening Breast Tomo Bilateral  Result Date: 09/14/2016 CLINICAL DATA:  Screening. EXAM: 2D DIGITAL SCREENING BILATERAL MAMMOGRAM WITH CAD AND ADJUNCT TOMO COMPARISON:  Previous exam(s). ACR Breast Density Category b: There are scattered areas of fibroglandular density. FINDINGS: There are no findings suspicious for malignancy. Images were processed with CAD. IMPRESSION: No mammographic evidence of malignancy. A result letter of this screening mammogram will be mailed directly to the patient. RECOMMENDATION: Screening mammogram in one year. (Code:SM-B-01Y) BI-RADS CATEGORY  1: Negative. Electronically Signed   By: Ammie Ferrier M.D.   On: 09/14/2016 14:20    Microbiology: No results found for this or any previous visit (from the past 240 hour(s)).   Labs: Basic Metabolic Panel:  Recent Labs Lab 09/16/16 0015 09/17/16 0717 09/18/16 0735  NA 136 140 140  K 3.8 3.6 3.7  CL 103 110 111  CO2 23 25 26   GLUCOSE 166* 148* 123*  BUN 20 22* 19  CREATININE 0.97 0.77 0.65  CALCIUM 9.5 8.4* 8.3*   Liver Function Tests:  Recent Labs Lab 09/16/16 0015  AST 26  ALT 20  ALKPHOS 69  BILITOT 1.0  PROT 6.8  ALBUMIN 3.9    Recent Labs Lab 09/16/16 0015  LIPASE 26   No results for input(s): AMMONIA in the last 168 hours. CBC:  Recent Labs Lab 09/16/16 0015 09/17/16 0717  WBC 11.5*  5.3  NEUTROABS 10.2*  --   HGB 13.9 13.8  HCT 41.4 42.5  MCV 95.4 97.9  PLT 288 284   Cardiac Enzymes: No results for input(s): CKTOTAL, CKMB, CKMBINDEX, TROPONINI in the last 168 hours. BNP: BNP (last 3 results) No results for input(s): BNP in the last 8760 hours.  ProBNP (last 3 results) No results for input(s): PROBNP in the last 8760 hours.  CBG: No results for input(s): GLUCAP in the last 168 hours.     Signed:  Bre Pecina MD.  [facility] 09/19/2016, 4:11 PM

## 2016-09-19 NOTE — Progress Notes (Signed)
SURGICAL PROGRESS NOTE (cpt [phone])  Hospital Day(s): 3.   Post op day(s):  Marland Kitchen   Interval History: Patient seen and examined, no acute events or new complaints overnight. Patient reports flatus and BM, tolerating regular/soft diet and ambulating, denies any N/V, fever/chills, CP, or SOB.  Review of Systems:  Constitutional: denies fever, chills  HEENT: denies cough or congestion  Respiratory: denies any shortness of breath  Cardiovascular: denies chest pain or palpitations  Gastrointestinal: denies abdominal pain or N/V and bowel function as per HPI Musculoskeletal: denies pain, decreased motor or sensation  Neurological: denies HA or vision/hearing changes   Vital signs in last 24 hours: [min-max] current  Temp:  [98.2 F (36.8 C)-99.3 F (37.4 C)] 99.3 F (37.4 C) (11/20 0705) Pulse Rate:  [63-80] 80 (11/20 0705) Resp:  [16-18] 16 (11/20 0705) BP: (136-155)/(59-67) 155/65 (11/20 0705) SpO2:  [95 %-100 %] 100 % (11/20 0705)     Height: 4\' 11"  (149.9 cm) Weight: 56 kg (123 lb 8 oz) BMI (Calculated): 25   Intake/Output this shift:  No intake/output data recorded.   Intake/Output last 2 shifts:  @IOLAST2SHIFTS @   Physical Exam:  Constitutional: alert, cooperative and no distress  HENT: normocephalic without obvious abnormality  Eyes: PERRL, EOM's grossly intact and symmetric  Neuro: CN II - XII grossly intact and symmetric without deficit  Respiratory: breathing non-labored at rest  Cardiovascular: regular rate and sinus rhythm  Gastrointestinal: soft, non-tender, and non-distended  Musculoskeletal: UE and LE FROM, motor and sensation grossly intact, NT    Labs:  CBC:  Lab Results  Component Value Date   WBC 5.3 09/17/2016   RBC 4.34 09/17/2016   BMP:  Lab Results  Component Value Date   GLUCOSE 123 (H) 09/18/2016   CO2 26 09/18/2016   BUN 19 09/18/2016   CREATININE 0.65 09/18/2016   CALCIUM 8.3 (L) 09/18/2016     Imaging studies: No new pertinent imaging  studies   Assessment/Plan: (ICD-10's: K56.52) 79 y.o.femalewith resolving small bowel obstruction, most likely attributable to post-surgical adhesive bands, complicated by pertinent comorbidities including hypothyroidism.  - advance diet as tolerated - medical management of co-morbidities as per medical team             - okay for discharge from surgical perspective - continue ambulation encouraged  All of the above findings and recommendations were discussed with the patient and her daughter, and all of patient's and her family's questions were answered to their expressed satisfaction.  Thank you for the opportunity to participate in this patient's care.  -- Marilynne Drivers Rosana Hoes, MD, [place]: [facility] Office: [phone]

## 2016-09-19 NOTE — Progress Notes (Signed)
Patient discharged with instructions given on medications,and follow up visits,patient,and family verbalized understanding. Prescriptions sent with family. Accompanied by staff to an awaiting vehicle.

## 2016-09-28 DIAGNOSIS — Z6821 Body mass index (BMI) 21.0-21.9, adult: Secondary | ICD-10-CM | POA: Diagnosis not present

## 2016-09-28 DIAGNOSIS — K56609 Unspecified intestinal obstruction, unspecified as to partial versus complete obstruction: Secondary | ICD-10-CM | POA: Diagnosis not present

## 2016-09-28 DIAGNOSIS — Z09 Encounter for follow-up examination after completed treatment for conditions other than malignant neoplasm: Secondary | ICD-10-CM | POA: Diagnosis not present

## 2016-11-02 ENCOUNTER — Encounter (INDEPENDENT_AMBULATORY_CARE_PROVIDER_SITE_OTHER): Payer: Self-pay | Admitting: *Deleted

## 2016-11-02 ENCOUNTER — Telehealth (INDEPENDENT_AMBULATORY_CARE_PROVIDER_SITE_OTHER): Payer: Self-pay | Admitting: *Deleted

## 2016-11-02 NOTE — Telephone Encounter (Signed)
Patient needs trilyte 

## 2016-11-03 MED ORDER — PEG 3350-KCL-NA BICARB-NACL 420 G PO SOLR: 4000.0000 mL | Freq: Once | ORAL | 0 refills | Status: AC

## 2016-11-08 ENCOUNTER — Telehealth (INDEPENDENT_AMBULATORY_CARE_PROVIDER_SITE_OTHER): Payer: Self-pay | Admitting: *Deleted

## 2016-11-08 NOTE — Telephone Encounter (Signed)
Patient is scheduled for TCS 27/18, she was in [place] in November for 4 to 5 days for intestinal blockage, she wants to know if ok to proceed with TCS or does she need to reschedule -- please advise

## 2016-11-08 NOTE — Telephone Encounter (Signed)
Referring MD/PCP: hall   Procedure: tcs  Reason/Indication:  Hx polyps  Has patient had this procedure before?  Yes, 2012  If so, when, by whom and where?    Is there a family history of colon cancer?  no  Who?  What age when diagnosed?    Is patient diabetic?   no      Does patient have prosthetic heart valve or mechanical valve?  no  Do you have a pacemaker?  no  Has patient ever had endocarditis? no  Has patient had joint replacement within last 12 months?  no  Does patient tend to be constipated or take laxatives? no  Does patient have a history of alcohol/drug use?  no  Is patient on Coumadin, Plavix and/or Aspirin? no  Medications: levothyroxine 100 mcg 1/2 tab daily  Allergies: nkda  Medication Adjustment:   Procedure date & time: 12/07/16 at 830

## 2016-11-10 NOTE — Telephone Encounter (Signed)
agree

## 2016-11-15 NOTE — Telephone Encounter (Signed)
Let us wait another 2 months and let she is having rectal bleeding.

## 2016-11-18 NOTE — Telephone Encounter (Signed)
TCS resch'd to 02/16/17, patient aware

## 2017-02-02 ENCOUNTER — Ambulatory Visit (INDEPENDENT_AMBULATORY_CARE_PROVIDER_SITE_OTHER): Payer: Medicare Other | Admitting: Internal Medicine

## 2017-02-02 ENCOUNTER — Encounter (INDEPENDENT_AMBULATORY_CARE_PROVIDER_SITE_OTHER): Payer: Self-pay

## 2017-02-02 ENCOUNTER — Encounter (INDEPENDENT_AMBULATORY_CARE_PROVIDER_SITE_OTHER): Payer: Self-pay | Admitting: Internal Medicine

## 2017-02-02 VITALS — BP 140/76 | HR 64 | Temp 97.4°F | Ht 59.0 in | Wt 121.1 lb

## 2017-02-02 DIAGNOSIS — K56609 Unspecified intestinal obstruction, unspecified as to partial versus complete obstruction: Secondary | ICD-10-CM | POA: Diagnosis not present

## 2017-02-02 NOTE — Progress Notes (Signed)
   Subjective:    Patient ID: Megan Conley, female    DOB: 09-Mar-1936, 81 y.o.   MRN: [id number]  HPI She presents today with c/o of her colonoscopy. She says she had a bowel blockage in November of 2017. She was admitted x 4 days. She says an NG was placed.  She was evaluated by Dr. Rosana Hoes. SBO was managed non surgically. Hx of large adenoma removed in October of 2008. Colonoscopy in 2012 revealed single sigmoid colon diverticulum. Her BMs are normal. She has a BM daily. Her appetite has been good.  Review of Systems Past Medical History:  Diagnosis Date  . Hypothyroidism     Past Surgical History:  Procedure Laterality Date  . ABDOMINAL HYSTERECTOMY    . cataract extraction both eyes    . COLONOSCOPY  06/02/2011   Procedure: COLONOSCOPY;  Surgeon: Rogene Houston, MD;  Location: [facility];  Service: Endoscopy;  Laterality: N/A;    No Known Allergies  Current Outpatient Prescriptions on File Prior to Visit  Medication Sig Dispense Refill  . calcium carbonate 1250 MG capsule Take 1,250 mg by mouth 2 (two) times daily with a meal.    . latanoprost (XALATAN) 0.005 % ophthalmic solution Place 1 drop into both eyes at bedtime.    Marland Kitchen levothyroxine (SYNTHROID, LEVOTHROID) 50 MCG tablet Take 50 mcg by mouth daily.      . magnesium 30 MG tablet Take 30 mg by mouth 2 (two) times daily.    . Multiple Vitamin (MULTIVITAMIN) capsule Take 1 capsule by mouth daily.    . Omega-3 Fatty Acids (FISH OIL) 500 MG CAPS Take by mouth.      . Red Yeast Rice 600 MG CAPS Take by mouth.      . ondansetron (ZOFRAN) 4 MG tablet Take 1 tablet (4 mg total) by mouth every 8 (eight) hours as needed for nausea or vomiting. (Patient not taking: Reported on 02/02/2017) 20 tablet 0  . senna-docusate (SENOKOT S) 8.6-50 MG tablet Take 2 tablets by mouth at bedtime. (Patient not taking: Reported on 02/02/2017)     No current facility-administered medications on file prior to visit.        Objective:   Physical Exam  Blood pressure 140/76, pulse 64, temperature 97.4 F (36.3 C), height 4\' 11"  (1.499 m), weight 121 lb 1.6 oz (54.9 kg).  Alert and oriented. Skin warm and dry. Oral mucosa is moist.   . Sclera anicteric, conjunctivae is pink. Thyroid not enlarged. No cervical lymphadenopathy. Lungs clear. Heart regular rate and rhythm.  Abdomen is soft. Bowel sounds are positive. No hepatomegaly. No abdominal masses felt. No tenderness.  No edema to lower extremities.         Assessment & Plan:  SBO. Patient was reassured. Scheduled for a colonoscopy this month for hx of adenoma.

## 2017-02-02 NOTE — Patient Instructions (Signed)
Proceed with a colonoscopy.

## 2017-02-06 ENCOUNTER — Encounter (INDEPENDENT_AMBULATORY_CARE_PROVIDER_SITE_OTHER): Payer: Self-pay | Admitting: *Deleted

## 2017-02-18 DIAGNOSIS — Z6821 Body mass index (BMI) 21.0-21.9, adult: Secondary | ICD-10-CM | POA: Diagnosis not present

## 2017-02-18 DIAGNOSIS — M67441 Ganglion, right hand: Secondary | ICD-10-CM | POA: Diagnosis not present

## 2017-03-08 DIAGNOSIS — H401212 Low-tension glaucoma, right eye, moderate stage: Secondary | ICD-10-CM | POA: Diagnosis not present

## 2017-03-14 DIAGNOSIS — Z6820 Body mass index (BMI) 20.0-20.9, adult: Secondary | ICD-10-CM | POA: Diagnosis not present

## 2017-03-14 DIAGNOSIS — Z8719 Personal history of other diseases of the digestive system: Secondary | ICD-10-CM | POA: Diagnosis not present

## 2017-03-14 DIAGNOSIS — E039 Hypothyroidism, unspecified: Secondary | ICD-10-CM | POA: Diagnosis not present

## 2017-03-22 ENCOUNTER — Encounter (HOSPITAL_COMMUNITY)
Admission: RE | Disposition: A | Discharge status: Home / Self Care | Payer: Self-pay | Source: Ambulatory Visit | Attending: Internal Medicine

## 2017-03-22 ENCOUNTER — Encounter (HOSPITAL_COMMUNITY): Payer: Self-pay | Admitting: *Deleted

## 2017-03-22 ENCOUNTER — Ambulatory Visit (HOSPITAL_COMMUNITY)
Admission: RE | Admit: 2017-03-22 | Discharge: 2017-03-22 | Disposition: A | Discharge status: Home / Self Care | Payer: Medicare Other | Source: Ambulatory Visit | Attending: Internal Medicine | Admitting: Internal Medicine

## 2017-03-22 DIAGNOSIS — K5730 Diverticulosis of large intestine without perforation or abscess without bleeding: Secondary | ICD-10-CM | POA: Insufficient documentation

## 2017-03-22 DIAGNOSIS — Z7982 Long term (current) use of aspirin: Secondary | ICD-10-CM | POA: Insufficient documentation

## 2017-03-22 DIAGNOSIS — K644 Residual hemorrhoidal skin tags: Secondary | ICD-10-CM | POA: Diagnosis not present

## 2017-03-22 DIAGNOSIS — Z801 Family history of malignant neoplasm of trachea, bronchus and lung: Secondary | ICD-10-CM | POA: Diagnosis not present

## 2017-03-22 DIAGNOSIS — D122 Benign neoplasm of ascending colon: Secondary | ICD-10-CM | POA: Insufficient documentation

## 2017-03-22 DIAGNOSIS — Z1211 Encounter for screening for malignant neoplasm of colon: Secondary | ICD-10-CM | POA: Insufficient documentation

## 2017-03-22 DIAGNOSIS — E039 Hypothyroidism, unspecified: Secondary | ICD-10-CM | POA: Diagnosis not present

## 2017-03-22 DIAGNOSIS — Z79899 Other long term (current) drug therapy: Secondary | ICD-10-CM | POA: Diagnosis not present

## 2017-03-22 DIAGNOSIS — Z86010 Personal history of colonic polyps: Secondary | ICD-10-CM | POA: Insufficient documentation

## 2017-03-22 DIAGNOSIS — Z90710 Acquired absence of both cervix and uterus: Secondary | ICD-10-CM | POA: Diagnosis not present

## 2017-03-22 DIAGNOSIS — Z09 Encounter for follow-up examination after completed treatment for conditions other than malignant neoplasm: Secondary | ICD-10-CM | POA: Diagnosis not present

## 2017-03-22 HISTORY — PX: COLONOSCOPY: SHX5424

## 2017-03-22 HISTORY — PX: POLYPECTOMY: SHX5525

## 2017-03-22 SURGERY — COLONOSCOPY
Anesthesia: Moderate Sedation

## 2017-03-22 MED ORDER — MEPERIDINE HCL 50 MG/ML IJ SOLN
INTRAMUSCULAR | Status: DC | PRN
  Administered 2017-03-22: 25 mg via INTRAVENOUS
  Administered 2017-03-22: 15 mg via INTRAVENOUS
  Administered 2017-03-22: 10 mg via INTRAVENOUS

## 2017-03-22 MED ORDER — MEPERIDINE HCL 50 MG/ML IJ SOLN
INTRAMUSCULAR | Status: AC
  Filled 2017-03-22: qty 1

## 2017-03-22 MED ORDER — STERILE WATER FOR IRRIGATION IR SOLN
Status: DC | PRN
  Administered 2017-03-22: 10:00:00

## 2017-03-22 MED ORDER — SODIUM CHLORIDE 0.9 % IV SOLN
INTRAVENOUS | Status: DC
  Administered 2017-03-22: 1000 mL via INTRAVENOUS

## 2017-03-22 MED ORDER — MIDAZOLAM HCL 5 MG/5ML IJ SOLN
INTRAMUSCULAR | Status: AC
  Filled 2017-03-22: qty 10

## 2017-03-22 MED ORDER — MIDAZOLAM HCL 5 MG/5ML IJ SOLN
INTRAMUSCULAR | Status: DC | PRN
  Administered 2017-03-22: 2 mg via INTRAVENOUS
  Administered 2017-03-22: 1 mg via INTRAVENOUS

## 2017-03-22 NOTE — Discharge Instructions (Signed)
Resume aspirin on 03/23/2017. Resume other medications as before. High fiber diet. No driving for 24 hours. Physician will call with biopsy results.   Colonoscopy, Adult, Care After This sheet gives you information about how to care for yourself after your procedure. Your doctor may also give you more specific instructions. If you have problems or questions, call your doctor. Follow these instructions at home: General instructions    For the first 24 hours after the procedure:  Do not drive or use machinery.  Do not sign important documents.  Do not drink alcohol.  Do your daily activities more slowly than normal.  Eat foods that are soft and easy to digest.  Rest often.  Take over-the-counter or prescription medicines only as told by your doctor.  It is up to you to get the results of your procedure. Ask your doctor, or the department performing the procedure, when your results will be ready. To help cramping and bloating:   Try walking around.  Put heat on your belly (abdomen) as told by your doctor. Use a heat source that your doctor recommends, such as a moist heat pack or a heating pad.  Put a towel between your skin and the heat source.  Leave the heat on for 20-30 minutes.  Remove the heat if your skin turns bright red. This is especially important if you cannot feel pain, heat, or cold. You can get burned. Eating and drinking   Drink enough fluid to keep your pee (urine) clear or pale yellow.  Return to your normal diet as told by your doctor. Avoid heavy or fried foods that are hard to digest.  Avoid drinking alcohol for as long as told by your doctor. Contact a doctor if:  You have blood in your poop (stool) 2-3 days after the procedure. Get help right away if:  You have more than a small amount of blood in your poop.  You see large clumps of tissue (blood clots) in your poop.  Your belly is swollen.  You feel sick to your stomach  (nauseous).  You throw up (vomit).  You have a fever.  You have belly pain that gets worse, and medicine does not help your pain. This information is not intended to replace advice given to you by your health care provider. Make sure you discuss any questions you have with your health care provider. Document Released: 11/19/2010 Document Revised: 07/11/2016 Document Reviewed: 07/11/2016 Elsevier Interactive Patient Education  2017 [employer]  Diverticulosis Diverticulosis is a condition that develops when small pouches (diverticula) form in the wall of the large intestine (colon). The colon is where water is absorbed and stool is formed. The pouches form when the inside layer of the colon pushes through weak spots in the outer layers of the colon. You may have a few pouches or many of them. What are the causes? The cause of this condition is not known. What increases the risk? The following factors may make you more likely to develop this condition:  Being older than age 54. Your risk for this condition increases with age. Diverticulosis is rare among people younger than age 54. By age 70, many people have it.  Eating a low-fiber diet.  Having frequent constipation.  Being overweight.  Not getting enough exercise.  Smoking.  Taking over-the-counter pain medicines, like aspirin and ibuprofen.  Having a family history of diverticulosis. What are the signs or symptoms? In most people, there are no symptoms of this condition. If you  do have symptoms, they may include:  Bloating.  Cramps in the abdomen.  Constipation or diarrhea.  Pain in the lower left side of the abdomen. How is this diagnosed? This condition is most often diagnosed during an exam for other colon problems. Because diverticulosis usually has no symptoms, it often cannot be diagnosed independently. This condition may be diagnosed by:  Using a flexible scope to examine the colon (colonoscopy).  Taking an  X-ray of the colon after dye has been put into the colon (barium enema).  Doing a CT scan. How is this treated? You may not need treatment for this condition if you have never developed an infection related to diverticulosis. If you have had an infection before, treatment may include:  Eating a high-fiber diet. This may include eating more fruits, vegetables, and grains.  Taking a fiber supplement.  Taking a live bacteria supplement (probiotic).  Taking medicine to relax your colon.  Taking antibiotic medicines. Follow these instructions at home:  Drink 6-8 glasses of water or more each day to prevent constipation.  Try not to strain when you have a bowel movement.  If you have had an infection before:  Eat more fiber as directed by your health care provider or your diet and nutrition specialist (dietitian).  Take a fiber supplement or probiotic, if your health care provider approves.  Take over-the-counter and prescription medicines only as told by your health care provider.  If you were prescribed an antibiotic, take it as told by your health care provider. Do not stop taking the antibiotic even if you start to feel better.  Keep all follow-up visits as told by your health care provider. This is important. Contact a health care provider if:  You have pain in your abdomen.  You have bloating.  You have cramps.  You have not had a bowel movement in 3 days. Get help right away if:  Your pain gets worse.  Your bloating becomes very bad.  You have a fever or chills, and your symptoms suddenly get worse.  You vomit.  You have bowel movements that are bloody or black.  You have bleeding from your rectum. Summary  Diverticulosis is a condition that develops when small pouches (diverticula) form in the wall of the large intestine (colon).  You may have a few pouches or many of them.  This condition is most often diagnosed during an exam for other colon  problems.  If you have had an infection related to diverticulosis, treatment may include increasing the fiber in your diet, taking supplements, or taking medicines. This information is not intended to replace advice given to you by your health care provider. Make sure you discuss any questions you have with your health care provider. Document Released: 07/14/2004 Document Revised: 09/05/2016 Document Reviewed: 09/05/2016 Elsevier Interactive Patient Education  2017 [employer].  Colon Polyps Polyps are tissue growths inside the body. Polyps can grow in many places, including the large intestine (colon). A polyp may be a round bump or a mushroom-shaped growth. You could have one polyp or several. Most colon polyps are noncancerous (benign). However, some colon polyps can become cancerous over time. What are the causes? The exact cause of colon polyps is not known. What increases the risk? This condition is more likely to develop in people who:  Have a family history of colon cancer or colon polyps.  Are older than 3 or older than 45 if they are African American.  Have inflammatory bowel disease, such  as ulcerative colitis or Crohn disease.  Are overweight.  Smoke cigarettes.  Do not get enough exercise.  Drink too much alcohol.  Eat a diet that is:  High in fat and red meat.  Low in fiber.  Had childhood cancer that was treated with abdominal radiation. What are the signs or symptoms? Most polyps do not cause symptoms. If you have symptoms, they may include:  Blood coming from your rectum when having a bowel movement.  Blood in your stool.The stool may look dark red or black.  A change in bowel habits, such as constipation or diarrhea. How is this diagnosed? This condition is diagnosed with a colonoscopy. This is a procedure that uses a lighted, flexible scope to look at the inside of your colon. How is this treated? Treatment for this condition involves removing any  polyps that are found. Those polyps will then be tested for cancer. If cancer is found, your health care provider will talk to you about options for colon cancer treatment. Follow these instructions at home: Diet   Eat plenty of fiber, such as fruits, vegetables, and whole grains.  Eat foods that are high in calcium and vitamin D, such as milk, cheese, yogurt, eggs, liver, fish, and broccoli.  Limit foods high in fat, red meats, and processed meats, such as hot dogs, sausage, bacon, and lunch meats.  Maintain a healthy weight, or lose weight if recommended by your health care provider. General instructions   Do not smoke cigarettes.  Do not drink alcohol excessively.  Keep all follow-up visits as told by your health care provider. This is important. This includes keeping regularly scheduled colonoscopies. Talk to your health care provider about when you need a colonoscopy.  Exercise every day or as told by your health care provider. Contact a health care provider if:  You have new or worsening bleeding during a bowel movement.  You have new or increased blood in your stool.  You have a change in bowel habits.  You unexpectedly lose weight. This information is not intended to replace advice given to you by your health care provider. Make sure you discuss any questions you have with your health care provider. Document Released: 07/13/2004 Document Revised: 03/24/2016 Document Reviewed: 09/07/2015 Elsevier Interactive Patient Education  2017 [employer].

## 2017-03-22 NOTE — Op Note (Signed)
[facility] Patient Name: Megan Conley Procedure Date: 03/22/2017 9:44 AM MRN: [id number] Date of Birth: 03/15/1936 Attending MD: Hildred Laser , MD CSN: [id number] Age: 81 Admit Type: Outpatient Procedure:                Colonoscopy Indications:              High risk colon cancer surveillance: Personal                            history of colonic polyps Providers:                Hildred Laser, MD, Otis Peak B. Sharon Seller, RN, Aram Candela Referring MD:             Delphina Cahill, MD Medicines:                Meperidine 50 mg IV, Midazolam 3 mg IV Complications:            No immediate complications. Estimated Blood Loss:     Estimated blood loss was minimal. Procedure:                Pre-Anesthesia Assessment:                           - Prior to the procedure, a History and Physical                            was performed, and patient medications and                            allergies were reviewed. The patient's tolerance of                            previous anesthesia was also reviewed. The risks                            and benefits of the procedure and the sedation                            options and risks were discussed with the patient.                            All questions were answered, and informed consent                            was obtained. Prior Anticoagulants: The patient                            last took aspirin 3 days prior to the procedure.                            ASA Grade Assessment: II - A patient with mild  systemic disease. After reviewing the risks and                            benefits, the patient was deemed in satisfactory                            condition to undergo the procedure.                           After obtaining informed consent, the colonoscope                            was passed under direct vision. Throughout the                            procedure, the patient's  blood pressure, pulse, and                            oxygen saturations were monitored continuously. The                            [id number] ([id number]) scope was introduced through                            the anus and advanced to the the cecum, identified                            by appendiceal orifice and ileocecal valve. The                            colonoscopy was somewhat difficult due to                            significant looping. Successful completion of the                            procedure was aided by changing the patient to a                            supine position and applying abdominal pressure.                            The patient tolerated the procedure well. The                            ileocecal valve, appendiceal orifice, and rectum                            were photographed. The quality of the bowel                            preparation was excellent. Scope In: 10:23:36 AM Scope Out: 10:44:56 AM Scope Withdrawal Time: 0 hours 5 minutes 42 seconds  Total Procedure Duration: 0 hours 21  minutes 20 seconds  Findings:      The perianal and digital rectal examinations were normal.      A small polyp was found in the proximal ascending colon. The polyp was       sessile. Biopsies were taken with a cold forceps for histology.      A few medium-mouthed diverticula were found in the sigmoid colon.      External hemorrhoids were found during retroflexion. The hemorrhoids       were small. Impression:               - One small polyp in the proximal ascending colon.                            Biopsied.                           - Diverticulosis in the sigmoid colon.                           - External hemorrhoids. Moderate Sedation:      Moderate (conscious) sedation was administered by the endoscopy nurse       and supervised by the endoscopist. The following parameters were       monitored: oxygen saturation, heart rate, blood pressure, CO2        capnography and response to care. Total physician intraservice time was       24 minutes. Recommendation:           - Patient has a contact number available for                            emergencies. The signs and symptoms of potential                            delayed complications were discussed with the                            patient. Return to normal activities tomorrow.                            Written discharge instructions were provided to the                            patient.                           - High fiber diet today.                           - Continue present medications.                           - Resume aspirin at prior dose tomorrow.                           - Await pathology results.                           -  No recommendation at this time regarding repeat                            colonoscopy due to age. Procedure Code(s):        --- Professional ---                           [phone], Colonoscopy, flexible; with biopsy, single                            or multiple                           99152, Moderate sedation services provided by the                            same physician or other qualified health care                            professional performing the diagnostic or                            therapeutic service that the sedation supports,                            requiring the presence of an independent trained                            observer to assist in the monitoring of the                            patient's level of consciousness and physiological                            status; initial 15 minutes of intraservice time,                            patient age 79 years or older                           [phone], Moderate sedation services; each additional                            15 minutes intraservice time Diagnosis Code(s):        --- Professional ---                           Z86.010, Personal history of colonic polyps                            D12.2, Benign neoplasm of ascending colon                           K64.4, Residual hemorrhoidal skin tags  K57.30, Diverticulosis of large intestine without                            perforation or abscess without bleeding CPT copyright 2016 American Medical Association. All rights reserved. The codes documented in this report are preliminary and upon coder review may  be revised to meet current compliance requirements. Hildred Laser, MD Hildred Laser, MD 03/22/2017 10:53:01 AM This report has been signed electronically. Number of Addenda: 0

## 2017-03-22 NOTE — H&P (Signed)
Megan Conley is an 81 y.o. female.   Chief Complaint: Patient is here for colonoscopy. HPI: Patient is 81 year old Caucasian female was history of colonic adenoma and is here for surveillance colonoscopy. 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 Last colonoscopy in August 2012 was unremarkable. She denies abdominal pain change in bowel habits or rectal bleeding. She was hospitalized in November 2017 for small bowel obstruction responded to medical therapy. Family history is negative for CRC.  Past Medical History:  Diagnosis Date  . Hypothyroidism     Past Surgical History:  Procedure Laterality Date  . ABDOMINAL HYSTERECTOMY    . cataract extraction both eyes    . COLONOSCOPY  06/02/2011   Procedure: COLONOSCOPY;  Surgeon: Rogene Houston, MD;  Location: [facility];  Service: Endoscopy;  Laterality: N/A;    Family History  Problem Relation Age of Onset  . Lung cancer Father   . Heart Problems Sister    Social History:  reports that she has never smoked. She has never used smokeless tobacco. She reports that she drinks about 2.4 oz of alcohol per week . She reports that she does not use drugs.  Allergies: No Known Allergies  Medications Prior to Admission  Medication Sig Dispense Refill  . aspirin EC 81 MG tablet Take 81 mg by mouth every other day.    . Biotin 5000 MCG CAPS Take 5,000 mcg by mouth daily.    . Calcium-Magnesium-Vitamin D (CALCIUM 1200+D3 PO) Take 1 tablet by mouth daily.    . folic acid (FOLVITE) 161 MCG tablet Take 800 mcg by mouth daily.    Marland Kitchen latanoprost (XALATAN) 0.005 % ophthalmic solution Place 1 drop into both eyes at bedtime.    Marland Kitchen levothyroxine (SYNTHROID, LEVOTHROID) 100 MCG tablet Take 50 mcg by mouth daily before breakfast.    . Magnesium ([employer]S TRIPLE MAGNESIUM COMPLEX) 400 MG CAPS Take 400 mg by mouth daily.    . Multiple Vitamin (MULTIVITAMIN WITH MINERALS) TABS tablet Take 1 tablet by mouth every  evening.    . Omega-3 Fatty Acids (FISH OIL) 1200 MG CAPS Take 1,200 mg by mouth 2 (two) times daily.    . Red Yeast Rice 600 MG CAPS Take 1,200 mg by mouth every evening.       No results found for this or any previous visit (from the past 48 hour(s)). No results found.  ROS  Blood pressure (!) 123/51, pulse (!) 59, temperature 97.6 F (36.4 C), temperature source Oral, resp. rate 12, height 4\' 11"  (1.499 m), weight 118 lb (53.5 kg), SpO2 99 %. Physical Exam  Constitutional: She appears well-developed and well-nourished.  HENT:  Mouth/Throat: Oropharynx is clear and moist.  Eyes: Conjunctivae are normal. No scleral icterus.  Neck: No thyromegaly present.  Cardiovascular: Normal rate, regular rhythm and normal heart sounds.   No murmur heard. Respiratory: Effort normal and breath sounds normal.  GI: Soft. She exhibits no distension and no mass. There is no tenderness.  Musculoskeletal: She exhibits no edema.  Lymphadenopathy:    She has no cervical adenopathy.  Neurological: She is alert.  Skin: Skin is warm and dry.     Assessment/Plan History of colonic adenoma. Surveillance colonoscopy.  Hildred Laser, MD 03/22/2017, 10:16 AM

## 2017-03-30 ENCOUNTER — Encounter (HOSPITAL_COMMUNITY): Payer: Self-pay | Admitting: Internal Medicine

## 2017-07-25 DIAGNOSIS — Z23 Encounter for immunization: Secondary | ICD-10-CM | POA: Diagnosis not present

## 2017-08-08 ENCOUNTER — Encounter (INDEPENDENT_AMBULATORY_CARE_PROVIDER_SITE_OTHER): Payer: Self-pay | Admitting: Internal Medicine

## 2017-08-08 ENCOUNTER — Ambulatory Visit (INDEPENDENT_AMBULATORY_CARE_PROVIDER_SITE_OTHER): Payer: Medicare Other | Admitting: Internal Medicine

## 2017-08-08 ENCOUNTER — Encounter (INDEPENDENT_AMBULATORY_CARE_PROVIDER_SITE_OTHER): Payer: Self-pay

## 2017-08-08 VITALS — BP 130/60 | HR 56 | Temp 97.7°F | Ht 59.0 in | Wt 122.5 lb

## 2017-08-08 DIAGNOSIS — R197 Diarrhea, unspecified: Secondary | ICD-10-CM | POA: Diagnosis not present

## 2017-08-08 NOTE — Progress Notes (Signed)
Subjective:    Patient ID: Megan Conley, female    DOB: 1936/06/13, 81 y.o.   MRN: [id number]  HPI Presents today with c/o periods of diarrhea. In the past month, she has had frequent diarrhea.  She has urgency. Has taken Imodium which did help.  In May of this year underwent a colonoscopy for high risk colon cancer surviellance. Impression:               - One small polyp in the proximal ascending colon.                            Biopsied.                           - Diverticulosis in the sigmoid colon.                           - External hemorrhoids.  Hx of SBO in November of 2017. Admitted x 4 days. Managed medically.  She ha not increased fiber in her diet.  Appetite is okay. No weight loss.  No recent antibiotics. Stools x 2 today were loose. No recent antibiotics.  Review of Systems . Past Medical History:  Diagnosis Date  . Hypothyroidism     Past Surgical History:  Procedure Laterality Date  . ABDOMINAL HYSTERECTOMY    . cataract extraction both eyes    . COLONOSCOPY  06/02/2011   Procedure: COLONOSCOPY;  Surgeon: Rogene Houston, MD;  Location: [facility];  Service: Endoscopy;  Laterality: N/A;  . COLONOSCOPY N/A 03/22/2017   Procedure: COLONOSCOPY;  Surgeon: Rogene Houston, MD;  Location: [facility];  Service: Endoscopy;  Laterality: N/A;  830 - MOVED TO 5/23 @ 10:30  . POLYPECTOMY  03/22/2017   Procedure: POLYPECTOMY;  Surgeon: Rogene Houston, MD;  Location: [facility];  Service: Endoscopy;;  colon    No Known Allergies  Current Outpatient Prescriptions on File Prior to Visit  Medication Sig Dispense Refill  . Biotin 5000 MCG CAPS Take 5,000 mcg by mouth daily.    . Calcium-Magnesium-Vitamin D (CALCIUM 1200+D3 PO) Take 1 tablet by mouth daily.    . folic acid (FOLVITE) 379 MCG tablet Take 800 mcg by mouth daily.    Marland Kitchen latanoprost (XALATAN) 0.005 % ophthalmic solution Place 1 drop into both eyes at bedtime.    Marland Kitchen levothyroxine (SYNTHROID, LEVOTHROID)  100 MCG tablet Take 50 mcg by mouth daily before breakfast.    . Magnesium ([employer]S TRIPLE MAGNESIUM COMPLEX) 400 MG CAPS Take 400 mg by mouth daily.    . Omega-3 Fatty Acids (FISH OIL) 1200 MG CAPS Take 1,200 mg by mouth 2 (two) times daily.    . Red Yeast Rice 600 MG CAPS Take 1,200 mg by mouth every evening.      No current facility-administered medications on file prior to visit.         Objective:   Physical Exam Blood pressure 130/60, pulse (!) 56, temperature 97.7 F (36.5 C), height 4\' 11"  (1.499 m), weight 122 lb 8 oz (55.6 kg).  Alert and oriented. Skin warm and dry. Oral mucosa is moist.   . Sclera anicteric, conjunctivae is pink. Thyroid not enlarged. No cervical lymphadenopathy. Lungs clear. Heart regular rate and rhythm.  Abdomen is soft. Bowel sounds are positive. No hepatomegaly. No abdominal masses felt. No tenderness.  No edema to lower extremities.         Assessment & Plan:  Diarrhea. Imodium once a day.  Increase fiber in diet.  Patient reassured.

## 2017-08-08 NOTE — Patient Instructions (Signed)
Increase fiber in diet. Imodium as needed.  Fiber one bars

## 2017-08-30 ENCOUNTER — Other Ambulatory Visit (HOSPITAL_COMMUNITY): Payer: Self-pay | Admitting: Internal Medicine

## 2017-08-30 DIAGNOSIS — Z1231 Encounter for screening mammogram for malignant neoplasm of breast: Secondary | ICD-10-CM

## 2017-09-14 DIAGNOSIS — I10 Essential (primary) hypertension: Secondary | ICD-10-CM | POA: Diagnosis not present

## 2017-09-14 DIAGNOSIS — E039 Hypothyroidism, unspecified: Secondary | ICD-10-CM | POA: Diagnosis not present

## 2017-09-14 DIAGNOSIS — E782 Mixed hyperlipidemia: Secondary | ICD-10-CM | POA: Diagnosis not present

## 2017-09-18 DIAGNOSIS — Z6822 Body mass index (BMI) 22.0-22.9, adult: Secondary | ICD-10-CM | POA: Diagnosis not present

## 2017-09-18 DIAGNOSIS — E782 Mixed hyperlipidemia: Secondary | ICD-10-CM | POA: Diagnosis not present

## 2017-09-18 DIAGNOSIS — E039 Hypothyroidism, unspecified: Secondary | ICD-10-CM | POA: Diagnosis not present

## 2017-09-18 DIAGNOSIS — R3981 Functional urinary incontinence: Secondary | ICD-10-CM | POA: Diagnosis not present

## 2017-09-18 DIAGNOSIS — H409 Unspecified glaucoma: Secondary | ICD-10-CM | POA: Diagnosis not present

## 2017-09-20 ENCOUNTER — Ambulatory Visit (HOSPITAL_COMMUNITY)
Admission: RE | Admit: 2017-09-20 | Discharge: 2017-09-20 | Disposition: A | Discharge status: Home / Self Care | Payer: Medicare Other | Source: Ambulatory Visit | Attending: Internal Medicine | Admitting: Internal Medicine

## 2017-09-20 ENCOUNTER — Encounter (HOSPITAL_COMMUNITY): Payer: Self-pay

## 2017-09-20 DIAGNOSIS — Z1231 Encounter for screening mammogram for malignant neoplasm of breast: Secondary | ICD-10-CM | POA: Insufficient documentation

## 2017-09-27 DIAGNOSIS — H401212 Low-tension glaucoma, right eye, moderate stage: Secondary | ICD-10-CM | POA: Diagnosis not present

## 2017-10-15 ENCOUNTER — Encounter (HOSPITAL_COMMUNITY): Payer: Self-pay | Admitting: Emergency Medicine

## 2017-10-15 ENCOUNTER — Other Ambulatory Visit: Payer: Self-pay

## 2017-10-15 ENCOUNTER — Emergency Department (HOSPITAL_COMMUNITY): Payer: Medicare Other

## 2017-10-15 ENCOUNTER — Emergency Department (HOSPITAL_COMMUNITY)
Admission: EM | Admit: 2017-10-15 | Discharge: 2017-10-15 | Disposition: A | Discharge status: Home / Self Care | Payer: Medicare Other | Attending: Emergency Medicine | Admitting: Emergency Medicine

## 2017-10-15 DIAGNOSIS — R197 Diarrhea, unspecified: Secondary | ICD-10-CM | POA: Diagnosis not present

## 2017-10-15 DIAGNOSIS — Z79899 Other long term (current) drug therapy: Secondary | ICD-10-CM | POA: Insufficient documentation

## 2017-10-15 DIAGNOSIS — E039 Hypothyroidism, unspecified: Secondary | ICD-10-CM | POA: Diagnosis not present

## 2017-10-15 HISTORY — DX: Unspecified intestinal obstruction, unspecified as to partial versus complete obstruction: K56.609

## 2017-10-15 HISTORY — DX: Unspecified glaucoma: H40.9

## 2017-10-15 LAB — COMPREHENSIVE METABOLIC PANEL
ALBUMIN: 3.5 g/dL (ref 3.5–5.0)
ALT: 25 U/L (ref 14–54)
AST: 31 U/L (ref 15–41)
Alkaline Phosphatase: 64 U/L (ref 38–126)
Anion gap: 6 (ref 5–15)
BUN: 21 mg/dL — AB (ref 6–20)
CHLORIDE: 111 mmol/L (ref 101–111)
CO2: 23 mmol/L (ref 22–32)
CREATININE: 0.69 mg/dL (ref 0.44–1.00)
Calcium: 8.9 mg/dL (ref 8.9–10.3)
GFR calc Af Amer: >60 mL/min (ref >60)
GLUCOSE: 92 mg/dL (ref 65–99)
POTASSIUM: 3.4 mmol/L — AB (ref 3.5–5.1)
Sodium: 140 mmol/L (ref 135–145)
Total Bilirubin: 0.4 mg/dL (ref 0.3–1.2)
Total Protein: 6.2 g/dL — ABNORMAL LOW (ref 6.5–8.1)

## 2017-10-15 LAB — URINALYSIS, ROUTINE W REFLEX MICROSCOPIC
Bilirubin Urine: NEGATIVE
GLUCOSE, UA: NEGATIVE mg/dL
HGB URINE DIPSTICK: NEGATIVE
KETONES UR: NEGATIVE mg/dL
LEUKOCYTES UA: NEGATIVE
Nitrite: NEGATIVE
PH: 6 (ref 5.0–8.0)
PROTEIN: NEGATIVE mg/dL
Specific Gravity, Urine: 1.017 (ref 1.005–1.030)

## 2017-10-15 LAB — CBC
HEMATOCRIT: 40.8 % (ref 36.0–46.0)
Hemoglobin: 13 g/dL (ref 12.0–15.0)
MCH: 31.6 pg (ref 26.0–34.0)
MCHC: 31.9 g/dL (ref 30.0–36.0)
MCV: 99.3 fL (ref 78.0–100.0)
PLATELETS: 257 10*3/uL (ref 150–400)
RBC: 4.11 MIL/uL (ref 3.87–5.11)
RDW: 12.9 % (ref 11.5–15.5)
WBC: 6.2 10*3/uL (ref 4.0–10.5)

## 2017-10-15 LAB — LIPASE, BLOOD: LIPASE: 26 U/L (ref 11–51)

## 2017-10-15 MED ORDER — SODIUM CHLORIDE 0.9 % IV BOLUS (SEPSIS)
1000.0000 mL | Freq: Once | INTRAVENOUS | Status: AC
  Administered 2017-10-15: 1000 mL via INTRAVENOUS

## 2017-10-15 NOTE — ED Provider Notes (Signed)
[facility] EMERGENCY DEPARTMENT Provider Note   CSN: [id number] Arrival date & time: 10/15/17  1022     History   Chief Complaint Chief Complaint  Patient presents with  . Diarrhea    HPI Megan Conley is a 81 y.o. female.  HPI Patient presents to the emergency room for evaluation of diarrhea and abdominal cramping.  Patient has been having trouble off and on ever since she had a bowel blockage about a year ago.  He was treated nonsurgically.  Since then she has been having intermittent episodes of diarrhea, abdominal cramping and belching.  She recently saw her GI doctor and they felt reassured by a negative colonoscopy this past year.  The daughter was unable to go to that visit and she is somewhat concerned about these recurrent symptoms.  Last evening the patient had another episode.  She started to feel very bloated, she began having a lot of flatulence and felt like her abdomen was distended.  This morning she also had several loose watery stools.  She is feeling better at this point.  She denies any abdominal pain.  No fevers. Past Medical History:  Diagnosis Date  . Bowel obstruction ([place])   . Glaucoma   . Hypothyroidism     Patient Active Problem List   Diagnosis Date Noted  . Elevated blood pressure reading without diagnosis of hypertension 09/17/2016  . Hyperglycemia 09/17/2016  . Small bowel obstruction ([place]) 09/16/2016  . SBO (small bowel obstruction) ([place]) 09/16/2016  . Hypothyroidism 09/16/2016  . Leukocytosis 09/16/2016  . History of colonic polyps 08/25/2016    Past Surgical History:  Procedure Laterality Date  . ABDOMINAL HYSTERECTOMY    . cataract extraction both eyes    . COLONOSCOPY  06/02/2011   Procedure: COLONOSCOPY;  Surgeon: Rogene Houston, MD;  [facility]: [facility];  Service: Endoscopy;  Laterality: N/A;  . COLONOSCOPY N/A 03/22/2017   Procedure: COLONOSCOPY;  Surgeon: Rogene Houston, MD;  Location: [facility];  Service: Endoscopy;   Laterality: N/A;  830 - MOVED TO 5/23 @ 10:30  . POLYPECTOMY  03/22/2017   Procedure: POLYPECTOMY;  Surgeon: Rogene Houston, MD;  Location: [facility];  Service: Endoscopy;;  colon    OB History    Gravida Para Term Preterm AB Living   2 2 2     2    SAB TAB Ectopic Multiple Live Births                   Home Medications    Prior to Admission medications   Medication Sig Start Date End Date Taking? Authorizing Provider  Biotin 5000 MCG CAPS Take 5,000 mcg by mouth daily.   Yes [provider]  Calcium-Magnesium-Vitamin D (CALCIUM 1200+D3 PO) Take 1 tablet by mouth daily.   Yes [provider]  folic acid (FOLVITE) 245 MCG tablet Take 800 mcg by mouth daily.   Yes [provider]  latanoprost (XALATAN) 0.005 % ophthalmic solution Place 1 drop into both eyes at bedtime. 09/06/16  Yes [provider]  levothyroxine (SYNTHROID, LEVOTHROID) 50 MCG tablet Take 50 mcg by mouth daily before breakfast.   Yes [provider]  Multiple Vitamin (MULTIVITAMIN WITH MINERALS) TABS tablet Take 1 tablet by mouth daily.   Yes [provider]  Omega-3 Fatty Acids (FISH OIL) 1200 MG CAPS Take 1,200 mg by mouth 2 (two) times daily.   Yes [provider]  Red Yeast Rice 600 MG CAPS Take  1,200 mg by mouth every evening.    Yes [provider]    Family History Family History  Problem Relation Age of Onset  . Lung cancer Father   . Heart Problems Sister     Social History Social History   Tobacco Use  . Smoking status: Never Smoker  . Smokeless tobacco: Never Used  Substance Use Topics  . Alcohol use: Yes    Alcohol/week: 2.4 oz    Types: 4 Glasses of wine per week  . Drug use: No     Allergies   Patient has no known allergies.   Review of Systems Review of Systems  All other systems reviewed and are negative.    Physical Exam Updated Vital Signs BP 135/84 (BP Location: Right Arm)   Pulse 67   Temp 97.8  F (36.6 C) (Oral)   Resp 18   Ht 1.499 m (4\' 11" )   Wt 54.4 kg (120 lb)   SpO2 100%   BMI 24.24 kg/m   Physical Exam  Constitutional: She appears well-developed and well-nourished. No distress.  HENT:  Head: Normocephalic and atraumatic.  Right Ear: External ear normal.  Left Ear: External ear normal.  Eyes: Conjunctivae are normal. Right eye exhibits no discharge. Left eye exhibits no discharge. No scleral icterus.  Neck: Neck supple. No tracheal deviation present.  Cardiovascular: Normal rate, regular rhythm and intact distal pulses.  Pulmonary/Chest: Effort normal and breath sounds normal. No stridor. No respiratory distress. She has no wheezes. She has no rales.  Abdominal: Soft. Bowel sounds are normal. She exhibits no distension. There is no tenderness. There is no rebound and no guarding.  Musculoskeletal: She exhibits no edema or tenderness.  Neurological: She is alert. She has normal strength. No cranial nerve deficit (no facial droop, extraocular movements intact, no slurred speech) or sensory deficit. She exhibits normal muscle tone. She displays no seizure activity. Coordination normal.  Skin: Skin is warm and dry. No rash noted.  Psychiatric: She has a normal mood and affect.  Nursing note and vitals reviewed.    ED Treatments / Results  Labs (all labs ordered are listed, but only abnormal results are displayed) Labs Reviewed  COMPREHENSIVE METABOLIC PANEL - Abnormal; Notable for the following components:      Result Value   Potassium 3.4 (*)    BUN 21 (*)    Total Protein 6.2 (*)    All other components within normal limits  URINALYSIS, ROUTINE W REFLEX MICROSCOPIC - Abnormal; Notable for the following components:   APPearance HAZY (*)    All other components within normal limits  LIPASE, BLOOD  CBC     Radiology Dg Abdomen Acute W/chest  Result Date: 10/15/2017 CLINICAL DATA:  81 year old female with diarrhea and bloating since yesterday. EXAM: DG  ABDOMEN ACUTE W/ 1V CHEST COMPARISON:  CT Abdomen and Pelvis 09/16/2016. FINDINGS: AP upright view of the chest. Cardiac size at the upper limits of normal. Other mediastinal contours are within normal limits. No pneumothorax or pneumoperitoneum. No confluent pulmonary opacity. Upright and supine views of the abdomen and pelvis. Non obstructed bowel gas pattern. Abdominal and pelvic visceral contours appear stable. Stable visualized osseous structures. Chronic T11 compression fracture. IMPRESSION: 1.  Normal bowel gas pattern, no free air. 2.  No acute cardiopulmonary abnormality. Electronically Signed   By: Genevie Ann M.D.   On: 10/15/2017 13:25    Procedures Procedures (including critical care time)  Medications Ordered in ED Medications  sodium chloride 0.9 %  bolus 1,000 mL (1,000 mLs Intravenous New Bag/Given 10/15/17 1229)     Initial Impression / Assessment and Plan / ED Course  I have reviewed the triage vital signs and the nursing notes.  Pertinent labs & imaging results that were available during my care of the patient were reviewed by me and considered in my medical decision making (see chart for details).   Patient presented to the emergency room because of intermittent episodes of diarrhea.  This has been occurring off and on for the last year.  Yesterday the patient had a more severe bout.  Patient's daughter was concerned that she may be developing another bowel obstruction which she had in the past.  Patient's not having any diarrhea today.  She is not having any abdominal discomfort.  Laboratory tests and x-rays are reassuring.  At this time there does not appear to be any evidence of an acute emergency medical condition and the patient appears stable for discharge with appropriate outpatient follow up.  Discussed contacting her GI doctor.   Final Clinical Impressions(s) / ED Diagnoses   Final diagnoses:  Diarrhea, unspecified type    ED Discharge Orders    None         Dorie Rank, MD 10/15/17 1407

## 2017-10-15 NOTE — Discharge Instructions (Signed)
Follow-up with your primary care doctor and GI doctor as we discussed.  Return for fever, vomiting, worsening symptoms

## 2017-10-15 NOTE — ED Triage Notes (Addendum)
Patient c/o diarrhea that started yesterday. Patient states started Friday feeling "blah" and then yesterday constant belching and flatulence with abd distension. Per patient diarrhea started this morning with watery stools. Patient has had an upper bowel blockage with similar symptoms. Patient seen Dr Laural Golden in past for blockage, no surgery was required. Denies any nausea, vomiting, or diarrhea.

## 2017-10-15 NOTE — ED Notes (Signed)
Pt ambulatory to bathroom with steady gait.

## 2017-11-01 DIAGNOSIS — J069 Acute upper respiratory infection, unspecified: Secondary | ICD-10-CM | POA: Diagnosis not present

## 2017-11-01 DIAGNOSIS — R197 Diarrhea, unspecified: Secondary | ICD-10-CM | POA: Diagnosis not present

## 2017-11-01 DIAGNOSIS — Z6821 Body mass index (BMI) 21.0-21.9, adult: Secondary | ICD-10-CM | POA: Diagnosis not present

## 2017-11-01 DIAGNOSIS — R32 Unspecified urinary incontinence: Secondary | ICD-10-CM | POA: Diagnosis not present

## 2017-11-24 DIAGNOSIS — K591 Functional diarrhea: Secondary | ICD-10-CM | POA: Diagnosis not present

## 2017-11-24 DIAGNOSIS — R3981 Functional urinary incontinence: Secondary | ICD-10-CM | POA: Diagnosis not present

## 2018-01-30 DIAGNOSIS — H409 Unspecified glaucoma: Secondary | ICD-10-CM | POA: Diagnosis not present

## 2018-01-30 DIAGNOSIS — Z6822 Body mass index (BMI) 22.0-22.9, adult: Secondary | ICD-10-CM | POA: Diagnosis not present

## 2018-01-30 DIAGNOSIS — K591 Functional diarrhea: Secondary | ICD-10-CM | POA: Diagnosis not present

## 2018-01-30 DIAGNOSIS — E782 Mixed hyperlipidemia: Secondary | ICD-10-CM | POA: Diagnosis not present

## 2018-01-30 DIAGNOSIS — R3981 Functional urinary incontinence: Secondary | ICD-10-CM | POA: Diagnosis not present

## 2018-01-30 DIAGNOSIS — Z6821 Body mass index (BMI) 21.0-21.9, adult: Secondary | ICD-10-CM | POA: Diagnosis not present

## 2018-01-30 DIAGNOSIS — E039 Hypothyroidism, unspecified: Secondary | ICD-10-CM | POA: Diagnosis not present

## 2018-01-30 DIAGNOSIS — J069 Acute upper respiratory infection, unspecified: Secondary | ICD-10-CM | POA: Diagnosis not present

## 2018-01-30 DIAGNOSIS — R32 Unspecified urinary incontinence: Secondary | ICD-10-CM | POA: Diagnosis not present

## 2018-01-30 DIAGNOSIS — R197 Diarrhea, unspecified: Secondary | ICD-10-CM | POA: Diagnosis not present

## 2018-02-01 DIAGNOSIS — R42 Dizziness and giddiness: Secondary | ICD-10-CM | POA: Diagnosis not present

## 2018-02-01 DIAGNOSIS — Z6821 Body mass index (BMI) 21.0-21.9, adult: Secondary | ICD-10-CM | POA: Diagnosis not present

## 2018-02-01 DIAGNOSIS — E039 Hypothyroidism, unspecified: Secondary | ICD-10-CM | POA: Diagnosis not present

## 2018-02-01 DIAGNOSIS — E782 Mixed hyperlipidemia: Secondary | ICD-10-CM | POA: Diagnosis not present

## 2018-02-01 DIAGNOSIS — R32 Unspecified urinary incontinence: Secondary | ICD-10-CM | POA: Diagnosis not present

## 2018-02-01 DIAGNOSIS — H409 Unspecified glaucoma: Secondary | ICD-10-CM | POA: Diagnosis not present

## 2018-04-06 DIAGNOSIS — H401212 Low-tension glaucoma, right eye, moderate stage: Secondary | ICD-10-CM | POA: Diagnosis not present

## 2018-07-20 DIAGNOSIS — M71349 Other bursal cyst, unspecified hand: Secondary | ICD-10-CM | POA: Diagnosis not present

## 2018-07-20 DIAGNOSIS — Z6821 Body mass index (BMI) 21.0-21.9, adult: Secondary | ICD-10-CM | POA: Diagnosis not present

## 2018-07-31 DIAGNOSIS — Z0000 Encounter for general adult medical examination without abnormal findings: Secondary | ICD-10-CM | POA: Diagnosis not present

## 2018-07-31 DIAGNOSIS — Z6821 Body mass index (BMI) 21.0-21.9, adult: Secondary | ICD-10-CM | POA: Diagnosis not present

## 2018-07-31 DIAGNOSIS — Z23 Encounter for immunization: Secondary | ICD-10-CM | POA: Diagnosis not present

## 2018-08-03 DIAGNOSIS — E782 Mixed hyperlipidemia: Secondary | ICD-10-CM | POA: Diagnosis not present

## 2018-08-03 DIAGNOSIS — E039 Hypothyroidism, unspecified: Secondary | ICD-10-CM | POA: Diagnosis not present

## 2018-08-07 DIAGNOSIS — Z6821 Body mass index (BMI) 21.0-21.9, adult: Secondary | ICD-10-CM | POA: Diagnosis not present

## 2018-08-07 DIAGNOSIS — E782 Mixed hyperlipidemia: Secondary | ICD-10-CM | POA: Diagnosis not present

## 2018-08-07 DIAGNOSIS — R3981 Functional urinary incontinence: Secondary | ICD-10-CM | POA: Diagnosis not present

## 2018-08-07 DIAGNOSIS — E039 Hypothyroidism, unspecified: Secondary | ICD-10-CM | POA: Diagnosis not present

## 2018-08-07 DIAGNOSIS — H409 Unspecified glaucoma: Secondary | ICD-10-CM | POA: Diagnosis not present

## 2018-08-30 ENCOUNTER — Other Ambulatory Visit (HOSPITAL_COMMUNITY): Payer: Self-pay | Admitting: Internal Medicine

## 2018-08-30 DIAGNOSIS — Z1231 Encounter for screening mammogram for malignant neoplasm of breast: Secondary | ICD-10-CM

## 2018-09-26 ENCOUNTER — Ambulatory Visit (HOSPITAL_COMMUNITY)
Admission: RE | Admit: 2018-09-26 | Discharge: 2018-09-26 | Disposition: A | Discharge status: Home / Self Care | Payer: Medicare Other | Source: Ambulatory Visit | Attending: Internal Medicine | Admitting: Internal Medicine

## 2018-09-26 DIAGNOSIS — Z1231 Encounter for screening mammogram for malignant neoplasm of breast: Secondary | ICD-10-CM | POA: Insufficient documentation

## 2018-10-04 DIAGNOSIS — H401212 Low-tension glaucoma, right eye, moderate stage: Secondary | ICD-10-CM | POA: Diagnosis not present

## 2019-02-18 DIAGNOSIS — E039 Hypothyroidism, unspecified: Secondary | ICD-10-CM | POA: Diagnosis not present

## 2019-02-18 DIAGNOSIS — E782 Mixed hyperlipidemia: Secondary | ICD-10-CM | POA: Diagnosis not present

## 2019-02-18 DIAGNOSIS — R32 Unspecified urinary incontinence: Secondary | ICD-10-CM | POA: Diagnosis not present

## 2019-02-18 DIAGNOSIS — H409 Unspecified glaucoma: Secondary | ICD-10-CM | POA: Diagnosis not present

## 2019-03-14 DIAGNOSIS — H401212 Low-tension glaucoma, right eye, moderate stage: Secondary | ICD-10-CM | POA: Diagnosis not present

## 2019-04-02 DIAGNOSIS — E782 Mixed hyperlipidemia: Secondary | ICD-10-CM | POA: Diagnosis not present

## 2019-04-02 DIAGNOSIS — R197 Diarrhea, unspecified: Secondary | ICD-10-CM | POA: Diagnosis not present

## 2019-04-02 DIAGNOSIS — Z0000 Encounter for general adult medical examination without abnormal findings: Secondary | ICD-10-CM | POA: Diagnosis not present

## 2019-04-02 DIAGNOSIS — Z6822 Body mass index (BMI) 22.0-22.9, adult: Secondary | ICD-10-CM | POA: Diagnosis not present

## 2019-04-02 DIAGNOSIS — K591 Functional diarrhea: Secondary | ICD-10-CM | POA: Diagnosis not present

## 2019-04-02 DIAGNOSIS — R32 Unspecified urinary incontinence: Secondary | ICD-10-CM | POA: Diagnosis not present

## 2019-04-02 DIAGNOSIS — J069 Acute upper respiratory infection, unspecified: Secondary | ICD-10-CM | POA: Diagnosis not present

## 2019-04-02 DIAGNOSIS — H409 Unspecified glaucoma: Secondary | ICD-10-CM | POA: Diagnosis not present

## 2019-04-02 DIAGNOSIS — R42 Dizziness and giddiness: Secondary | ICD-10-CM | POA: Diagnosis not present

## 2019-04-02 DIAGNOSIS — R3981 Functional urinary incontinence: Secondary | ICD-10-CM | POA: Diagnosis not present

## 2019-04-02 DIAGNOSIS — G4762 Sleep related leg cramps: Secondary | ICD-10-CM | POA: Diagnosis not present

## 2019-04-02 DIAGNOSIS — Z6821 Body mass index (BMI) 21.0-21.9, adult: Secondary | ICD-10-CM | POA: Diagnosis not present

## 2019-04-02 DIAGNOSIS — E039 Hypothyroidism, unspecified: Secondary | ICD-10-CM | POA: Diagnosis not present

## 2019-06-11 DIAGNOSIS — B351 Tinea unguium: Secondary | ICD-10-CM | POA: Diagnosis not present

## 2019-06-11 DIAGNOSIS — Q828 Other specified congenital malformations of skin: Secondary | ICD-10-CM | POA: Diagnosis not present

## 2019-06-25 DIAGNOSIS — Z23 Encounter for immunization: Secondary | ICD-10-CM | POA: Diagnosis not present

## 2019-08-20 ENCOUNTER — Other Ambulatory Visit (HOSPITAL_COMMUNITY): Payer: Self-pay | Admitting: Internal Medicine

## 2019-08-20 DIAGNOSIS — Z1231 Encounter for screening mammogram for malignant neoplasm of breast: Secondary | ICD-10-CM

## 2019-08-23 DIAGNOSIS — H4720 Unspecified optic atrophy: Secondary | ICD-10-CM | POA: Diagnosis not present

## 2019-08-23 DIAGNOSIS — H401212 Low-tension glaucoma, right eye, moderate stage: Secondary | ICD-10-CM | POA: Diagnosis not present

## 2019-09-10 DIAGNOSIS — B351 Tinea unguium: Secondary | ICD-10-CM | POA: Diagnosis not present

## 2019-10-02 ENCOUNTER — Ambulatory Visit (HOSPITAL_COMMUNITY): Payer: Medicare Other

## 2019-10-08 DIAGNOSIS — E782 Mixed hyperlipidemia: Secondary | ICD-10-CM | POA: Diagnosis not present

## 2019-10-08 DIAGNOSIS — Z6821 Body mass index (BMI) 21.0-21.9, adult: Secondary | ICD-10-CM | POA: Diagnosis not present

## 2019-10-08 DIAGNOSIS — Z1321 Encounter for screening for nutritional disorder: Secondary | ICD-10-CM | POA: Diagnosis not present

## 2019-10-08 DIAGNOSIS — E039 Hypothyroidism, unspecified: Secondary | ICD-10-CM | POA: Diagnosis not present

## 2019-10-15 DIAGNOSIS — E782 Mixed hyperlipidemia: Secondary | ICD-10-CM | POA: Diagnosis not present

## 2019-10-15 DIAGNOSIS — H409 Unspecified glaucoma: Secondary | ICD-10-CM | POA: Diagnosis not present

## 2019-10-15 DIAGNOSIS — R3981 Functional urinary incontinence: Secondary | ICD-10-CM | POA: Diagnosis not present

## 2019-10-15 DIAGNOSIS — E039 Hypothyroidism, unspecified: Secondary | ICD-10-CM | POA: Diagnosis not present

## 2019-11-15 DIAGNOSIS — Z23 Encounter for immunization: Secondary | ICD-10-CM | POA: Diagnosis not present

## 2019-12-13 DIAGNOSIS — Z23 Encounter for immunization: Secondary | ICD-10-CM | POA: Diagnosis not present

## 2019-12-30 DIAGNOSIS — I10 Essential (primary) hypertension: Secondary | ICD-10-CM | POA: Diagnosis not present

## 2019-12-30 DIAGNOSIS — R3981 Functional urinary incontinence: Secondary | ICD-10-CM | POA: Diagnosis not present

## 2019-12-30 DIAGNOSIS — E782 Mixed hyperlipidemia: Secondary | ICD-10-CM | POA: Diagnosis not present

## 2019-12-30 DIAGNOSIS — E039 Hypothyroidism, unspecified: Secondary | ICD-10-CM | POA: Diagnosis not present

## 2019-12-30 DIAGNOSIS — H409 Unspecified glaucoma: Secondary | ICD-10-CM | POA: Diagnosis not present

## 2020-02-10 DIAGNOSIS — G4762 Sleep related leg cramps: Secondary | ICD-10-CM | POA: Diagnosis not present

## 2020-02-10 DIAGNOSIS — R197 Diarrhea, unspecified: Secondary | ICD-10-CM | POA: Diagnosis not present

## 2020-02-10 DIAGNOSIS — G3184 Mild cognitive impairment, so stated: Secondary | ICD-10-CM | POA: Diagnosis not present

## 2020-02-10 DIAGNOSIS — Z6821 Body mass index (BMI) 21.0-21.9, adult: Secondary | ICD-10-CM | POA: Diagnosis not present

## 2020-02-10 DIAGNOSIS — E782 Mixed hyperlipidemia: Secondary | ICD-10-CM | POA: Diagnosis not present

## 2020-02-10 DIAGNOSIS — E039 Hypothyroidism, unspecified: Secondary | ICD-10-CM | POA: Diagnosis not present

## 2020-02-13 DIAGNOSIS — S50862A Insect bite (nonvenomous) of left forearm, initial encounter: Secondary | ICD-10-CM | POA: Diagnosis not present

## 2020-02-13 DIAGNOSIS — D225 Melanocytic nevi of trunk: Secondary | ICD-10-CM | POA: Diagnosis not present

## 2020-02-13 DIAGNOSIS — B078 Other viral warts: Secondary | ICD-10-CM | POA: Diagnosis not present

## 2020-02-13 DIAGNOSIS — Z1283 Encounter for screening for malignant neoplasm of skin: Secondary | ICD-10-CM | POA: Diagnosis not present

## 2020-02-13 DIAGNOSIS — X32XXXA Exposure to sunlight, initial encounter: Secondary | ICD-10-CM | POA: Diagnosis not present

## 2020-02-13 DIAGNOSIS — L570 Actinic keratosis: Secondary | ICD-10-CM | POA: Diagnosis not present

## 2020-02-27 DIAGNOSIS — H401231 Low-tension glaucoma, bilateral, mild stage: Secondary | ICD-10-CM | POA: Diagnosis not present

## 2020-03-10 DIAGNOSIS — B351 Tinea unguium: Secondary | ICD-10-CM | POA: Diagnosis not present

## 2020-04-02 ENCOUNTER — Ambulatory Visit (HOSPITAL_COMMUNITY)
Admission: RE | Admit: 2020-04-02 | Discharge: 2020-04-02 | Disposition: A | Discharge status: Home / Self Care | Payer: Medicare Other | Source: Ambulatory Visit | Attending: Internal Medicine | Admitting: Internal Medicine

## 2020-04-02 ENCOUNTER — Other Ambulatory Visit: Payer: Self-pay

## 2020-04-02 DIAGNOSIS — Z1231 Encounter for screening mammogram for malignant neoplasm of breast: Secondary | ICD-10-CM | POA: Insufficient documentation

## 2020-04-06 ENCOUNTER — Other Ambulatory Visit (HOSPITAL_COMMUNITY): Payer: Self-pay | Admitting: Internal Medicine

## 2020-04-06 DIAGNOSIS — R928 Other abnormal and inconclusive findings on diagnostic imaging of breast: Secondary | ICD-10-CM

## 2020-04-09 ENCOUNTER — Other Ambulatory Visit: Payer: Self-pay | Admitting: Internal Medicine

## 2020-04-09 DIAGNOSIS — R928 Other abnormal and inconclusive findings on diagnostic imaging of breast: Secondary | ICD-10-CM

## 2020-04-15 ENCOUNTER — Other Ambulatory Visit: Payer: Self-pay

## 2020-04-15 ENCOUNTER — Other Ambulatory Visit: Payer: Self-pay | Admitting: Internal Medicine

## 2020-04-15 ENCOUNTER — Ambulatory Visit
Admission: RE | Admit: 2020-04-15 | Discharge: 2020-04-15 | Disposition: A | Discharge status: Home / Self Care | Payer: Medicare Other | Source: Ambulatory Visit | Attending: Internal Medicine | Admitting: Internal Medicine

## 2020-04-15 DIAGNOSIS — R921 Mammographic calcification found on diagnostic imaging of breast: Secondary | ICD-10-CM

## 2020-04-15 DIAGNOSIS — R928 Other abnormal and inconclusive findings on diagnostic imaging of breast: Secondary | ICD-10-CM

## 2020-04-21 ENCOUNTER — Encounter (HOSPITAL_COMMUNITY): Payer: Medicare Other

## 2020-04-21 ENCOUNTER — Other Ambulatory Visit (HOSPITAL_COMMUNITY): Payer: Medicare Other

## 2020-04-21 ENCOUNTER — Encounter (HOSPITAL_COMMUNITY): Payer: Self-pay

## 2020-05-12 DIAGNOSIS — Z0001 Encounter for general adult medical examination with abnormal findings: Secondary | ICD-10-CM | POA: Diagnosis not present

## 2020-05-12 DIAGNOSIS — N3281 Overactive bladder: Secondary | ICD-10-CM | POA: Diagnosis not present

## 2020-05-12 DIAGNOSIS — E7849 Other hyperlipidemia: Secondary | ICD-10-CM | POA: Diagnosis not present

## 2020-05-12 DIAGNOSIS — I10 Essential (primary) hypertension: Secondary | ICD-10-CM | POA: Diagnosis not present

## 2020-05-12 DIAGNOSIS — G3184 Mild cognitive impairment, so stated: Secondary | ICD-10-CM | POA: Diagnosis not present

## 2020-05-12 DIAGNOSIS — R197 Diarrhea, unspecified: Secondary | ICD-10-CM | POA: Diagnosis not present

## 2020-05-12 DIAGNOSIS — G4762 Sleep related leg cramps: Secondary | ICD-10-CM | POA: Diagnosis not present

## 2020-05-12 DIAGNOSIS — E039 Hypothyroidism, unspecified: Secondary | ICD-10-CM | POA: Diagnosis not present

## 2020-06-09 DIAGNOSIS — B351 Tinea unguium: Secondary | ICD-10-CM | POA: Diagnosis not present

## 2020-06-29 DIAGNOSIS — H401231 Low-tension glaucoma, bilateral, mild stage: Secondary | ICD-10-CM | POA: Diagnosis not present

## 2020-07-03 DIAGNOSIS — I10 Essential (primary) hypertension: Secondary | ICD-10-CM | POA: Diagnosis not present

## 2020-07-03 DIAGNOSIS — R3981 Functional urinary incontinence: Secondary | ICD-10-CM | POA: Diagnosis not present

## 2020-07-03 DIAGNOSIS — E782 Mixed hyperlipidemia: Secondary | ICD-10-CM | POA: Diagnosis not present

## 2020-07-03 DIAGNOSIS — H409 Unspecified glaucoma: Secondary | ICD-10-CM | POA: Diagnosis not present

## 2020-07-03 DIAGNOSIS — E039 Hypothyroidism, unspecified: Secondary | ICD-10-CM | POA: Diagnosis not present

## 2020-08-03 DIAGNOSIS — Z23 Encounter for immunization: Secondary | ICD-10-CM | POA: Diagnosis not present

## 2020-08-21 DIAGNOSIS — Z23 Encounter for immunization: Secondary | ICD-10-CM | POA: Diagnosis not present

## 2020-08-26 ENCOUNTER — Encounter (HOSPITAL_COMMUNITY): Payer: Self-pay

## 2020-08-26 ENCOUNTER — Other Ambulatory Visit: Payer: Self-pay

## 2020-08-26 ENCOUNTER — Emergency Department (HOSPITAL_COMMUNITY)
Admission: EM | Admit: 2020-08-26 | Discharge: 2020-08-26 | Disposition: A | Discharge status: Home / Self Care | Payer: Medicare Other | Source: Home / Self Care | Attending: Emergency Medicine | Admitting: Emergency Medicine

## 2020-08-26 ENCOUNTER — Emergency Department (HOSPITAL_COMMUNITY): Payer: Medicare Other

## 2020-08-26 DIAGNOSIS — K529 Noninfective gastroenteritis and colitis, unspecified: Secondary | ICD-10-CM | POA: Insufficient documentation

## 2020-08-26 DIAGNOSIS — Z20822 Contact with and (suspected) exposure to covid-19: Secondary | ICD-10-CM | POA: Diagnosis not present

## 2020-08-26 DIAGNOSIS — R1032 Left lower quadrant pain: Secondary | ICD-10-CM | POA: Diagnosis not present

## 2020-08-26 DIAGNOSIS — K56609 Unspecified intestinal obstruction, unspecified as to partial versus complete obstruction: Secondary | ICD-10-CM | POA: Diagnosis not present

## 2020-08-26 DIAGNOSIS — I10 Essential (primary) hypertension: Secondary | ICD-10-CM | POA: Diagnosis not present

## 2020-08-26 DIAGNOSIS — Z79899 Other long term (current) drug therapy: Secondary | ICD-10-CM | POA: Insufficient documentation

## 2020-08-26 DIAGNOSIS — E039 Hypothyroidism, unspecified: Secondary | ICD-10-CM | POA: Insufficient documentation

## 2020-08-26 DIAGNOSIS — R030 Elevated blood-pressure reading, without diagnosis of hypertension: Secondary | ICD-10-CM | POA: Diagnosis not present

## 2020-08-26 DIAGNOSIS — R109 Unspecified abdominal pain: Secondary | ICD-10-CM | POA: Diagnosis not present

## 2020-08-26 DIAGNOSIS — D72829 Elevated white blood cell count, unspecified: Secondary | ICD-10-CM | POA: Diagnosis not present

## 2020-08-26 DIAGNOSIS — R1030 Lower abdominal pain, unspecified: Secondary | ICD-10-CM

## 2020-08-26 DIAGNOSIS — N179 Acute kidney failure, unspecified: Secondary | ICD-10-CM | POA: Diagnosis not present

## 2020-08-26 DIAGNOSIS — K565 Intestinal adhesions [bands], unspecified as to partial versus complete obstruction: Secondary | ICD-10-CM | POA: Diagnosis not present

## 2020-08-26 LAB — COMPREHENSIVE METABOLIC PANEL
ALT: 18 U/L (ref 0–44)
AST: 21 U/L (ref 15–41)
Albumin: 3.7 g/dL (ref 3.5–5.0)
Alkaline Phosphatase: 77 U/L (ref 38–126)
Anion gap: 7 (ref 5–15)
BUN: 17 mg/dL (ref 8–23)
CO2: 26 mmol/L (ref 22–32)
Calcium: 9.1 mg/dL (ref 8.9–10.3)
Chloride: 105 mmol/L (ref 98–111)
Creatinine, Ser: 0.81 mg/dL (ref 0.44–1.00)
GFR, Estimated: >60 mL/min (ref >60)
Glucose, Bld: 97 mg/dL (ref 70–99)
Potassium: 4 mmol/L (ref 3.5–5.1)
Sodium: 138 mmol/L (ref 135–145)
Total Bilirubin: 0.7 mg/dL (ref 0.3–1.2)
Total Protein: 6.8 g/dL (ref 6.5–8.1)

## 2020-08-26 LAB — CBC
HCT: 43.8 % (ref 36.0–46.0)
Hemoglobin: 14 g/dL (ref 12.0–15.0)
MCH: 31.3 pg (ref 26.0–34.0)
MCHC: 32 g/dL (ref 30.0–36.0)
MCV: 98 fL (ref 80.0–100.0)
Platelets: 318 10*3/uL (ref 150–400)
RBC: 4.47 MIL/uL (ref 3.87–5.11)
RDW: 12.5 % (ref 11.5–15.5)
WBC: 6.9 10*3/uL (ref 4.0–10.5)
nRBC: 0 % (ref 0.0–0.2)

## 2020-08-26 LAB — LIPASE, BLOOD: Lipase: 29 U/L (ref 11–51)

## 2020-08-26 MED ORDER — ONDANSETRON HCL 4 MG/2ML IJ SOLN
4.0000 mg | Freq: Once | INTRAMUSCULAR | Status: AC
  Administered 2020-08-26: 4 mg via INTRAVENOUS
  Filled 2020-08-26: qty 2

## 2020-08-26 MED ORDER — IOHEXOL 300 MG/ML  SOLN
75.0000 mL | Freq: Once | INTRAMUSCULAR | Status: AC | PRN
  Administered 2020-08-26: 75 mL via INTRAVENOUS

## 2020-08-26 MED ORDER — IOHEXOL 300 MG/ML  SOLN: 100.0000 mL | Freq: Once | INTRAMUSCULAR | Status: DC | PRN

## 2020-08-26 MED ORDER — HYDROMORPHONE HCL 1 MG/ML IJ SOLN
0.5000 mg | Freq: Once | INTRAMUSCULAR | Status: AC
  Administered 2020-08-26: 0.5 mg via INTRAVENOUS
  Filled 2020-08-26: qty 1

## 2020-08-26 MED ORDER — HYDROCODONE-ACETAMINOPHEN 5-325 MG PO TABS: 1.0000 | ORAL_TABLET | Freq: Four times a day (QID) | ORAL | 0 refills | Status: DC | PRN

## 2020-08-26 NOTE — ED Provider Notes (Signed)
[facility] EMERGENCY DEPARTMENT Provider Note   CSN: [id number] Arrival date & time: 08/26/20  0757     History No chief complaint on file.   Megan Conley is a 84 y.o. female.  Patient with acute onset of abdominal pain at about 5 this 30 this morning. Patient states mostly lower quadrants of the abdomen. Patient apparently had bowel obstruction in the past and she is concerned about it could be that. Based on the lower quadrants I had some concern may be about diverticulitis she is never had a history of that. Patient's had all 3 Covid vaccines. To include the booster. No nausea no vomiting no diarrhea.        Past Medical History:  Diagnosis Date  . Bowel obstruction ([place])   . Glaucoma   . Hypothyroidism     Patient Active Problem List   Diagnosis Date Noted  . Elevated blood pressure reading without diagnosis of hypertension 09/17/2016  . Hyperglycemia 09/17/2016  . Small bowel obstruction ([place]) 09/16/2016  . SBO (small bowel obstruction) ([place]) 09/16/2016  . Hypothyroidism 09/16/2016  . Leukocytosis 09/16/2016  . History of colonic polyps 08/25/2016    Past Surgical History:  Procedure Laterality Date  . ABDOMINAL HYSTERECTOMY    . cataract extraction both eyes    . COLONOSCOPY  06/02/2011   Procedure: COLONOSCOPY;  Surgeon: Rogene Houston, MD;  [facility];  Service: Endoscopy;  Laterality: N/A;  . COLONOSCOPY N/A 03/22/2017   Procedure: COLONOSCOPY;  Surgeon: Rogene Houston, MD;  [facility];  Service: Endoscopy;  Laterality: N/A;  830 - MOVED TO 5/23 @ 10:30  . POLYPECTOMY  03/22/2017   Procedure: POLYPECTOMY;  Surgeon: Rogene Houston, MD;  [facility];  Service: Endoscopy;;  colon     OB History    Gravida  2   Para  2   Term  2   Preterm      AB      Living  2     SAB      TAB      Ectopic      Multiple      Live Births              Family History  Problem Relation Age of Onset  . Lung  cancer Father   . Heart Problems Sister     Social History   Tobacco Use  . Smoking status: Never Smoker  . Smokeless tobacco: Never Used  Vaping Use  . Vaping Use: Never used  Substance Use Topics  . Alcohol use: Yes    Alcohol/week: 4.0 standard drinks    Types: 4 Glasses of wine per week  . Drug use: No    Home Medications Prior to Admission medications   Medication Sig Start Date End Date Taking? Authorizing Provider  ALPHAGAN P 0.1 % SOLN Apply 1 drop to eye daily.  06/30/20  Yes [provider]  Biotin 5000 MCG CAPS Take 5,000 mcg by mouth daily.   Yes [provider]  Calcium-Magnesium-Vitamin D (CALCIUM 1200+D3 PO) Take 1 tablet by mouth daily.   Yes [provider]  folic acid (FOLVITE) 093 MCG tablet Take 800 mcg by mouth daily.   Yes [provider]  latanoprost (XALATAN) 0.005 % ophthalmic solution Place 1 drop into both eyes at bedtime. 09/06/16  Yes [provider]  levothyroxine (SYNTHROID, LEVOTHROID) 50 MCG tablet Take 50 mcg by mouth daily before breakfast.  Yes [provider]  Multiple Vitamin (MULTIVITAMIN WITH MINERALS) TABS tablet Take 1 tablet by mouth daily.   Yes [provider]  Omega-3 Fatty Acids (FISH OIL) 1200 MG CAPS Take 1,200 mg by mouth 2 (two) times daily.   Yes [provider]  Red Yeast Rice 600 MG CAPS Take 1,200 mg by mouth every evening.    Yes [provider]  HYDROcodone-acetaminophen (NORCO/VICODIN) 5-325 MG tablet Take 1 tablet by mouth every 6 (six) hours as needed for moderate pain. 08/26/20   Fredia Sorrow, MD    Allergies    Patient has no known allergies.  Review of Systems   Review of Systems  Constitutional: Negative for chills and fever.  HENT: Negative for congestion, rhinorrhea and sore throat.   Eyes: Negative for visual disturbance.  Respiratory: Negative for cough and shortness of breath.   Cardiovascular: Negative for chest pain and  leg swelling.  Gastrointestinal: Positive for abdominal pain. Negative for diarrhea, nausea and vomiting.  Genitourinary: Negative for dysuria.  Musculoskeletal: Negative for back pain and neck pain.  Skin: Negative for rash.  Neurological: Negative for dizziness, light-headedness and headaches.  Hematological: Does not bruise/bleed easily.  Psychiatric/Behavioral: Negative for confusion.    Physical Exam Updated Vital Signs BP (!) 168/71   Pulse 66   Temp 97.6 F (36.4 C) (Oral)   Resp 16   Wt 54.4 kg   SpO2 98%   BMI 24.24 kg/m   Physical Exam Vitals and nursing note reviewed.  Constitutional:      General: She is not in acute distress.    Appearance: Normal appearance. She is well-developed.  HENT:     Head: Normocephalic and atraumatic.  Eyes:     Extraocular Movements: Extraocular movements intact.     Conjunctiva/sclera: Conjunctivae normal.     Pupils: Pupils are equal, round, and reactive to light.  Cardiovascular:     Rate and Rhythm: Normal rate and regular rhythm.     Heart sounds: No murmur heard.   Pulmonary:     Effort: Pulmonary effort is normal. No respiratory distress.     Breath sounds: Normal breath sounds.  Abdominal:     General: There is distension.     Palpations: Abdomen is soft.     Tenderness: There is abdominal tenderness. There is no guarding.     Comments: Somewhat diffusely tender.  Musculoskeletal:        General: Normal range of motion.     Cervical back: Neck supple.  Skin:    General: Skin is warm and dry.     Capillary Refill: Capillary refill takes less than 2 seconds.  Neurological:     General: No focal deficit present.     Mental Status: She is alert and oriented to person, place, and time.     Cranial Nerves: No cranial nerve deficit.     Sensory: No sensory deficit.     Motor: No weakness.     ED Results / Procedures / Treatments   Labs (all labs ordered are listed, but only abnormal results are displayed) Labs  Reviewed  LIPASE, BLOOD  COMPREHENSIVE METABOLIC PANEL  CBC  URINALYSIS, ROUTINE W REFLEX MICROSCOPIC    EKG None  Radiology CT Abdomen Pelvis W Contrast  Result Date: 08/26/2020 CLINICAL DATA:  Lower abdominal pain. EXAM: CT ABDOMEN AND PELVIS WITH CONTRAST TECHNIQUE: Multidetector CT imaging of the abdomen and pelvis was performed using the standard protocol following bolus administration of intravenous contrast. CONTRAST:  77mL OMNIPAQUE IOHEXOL 300 MG/ML  SOLN COMPARISON:  None. FINDINGS: Lower chest: Mild bibasilar atelectasis. No consolidation or pleural effusions. Hepatobiliary: No focal liver abnormality is seen. No gallstones, gallbladder wall thickening, or biliary dilatation. Pancreas: Unremarkable. No pancreatic ductal dilatation or surrounding inflammatory changes. Spleen: Normal in size without focal abnormality. Adrenals/Urinary Tract: Adrenal glands are unremarkable. Kidneys are normal, without renal calculi, focal lesion, or hydronephrosis. Bladder is unremarkable. Stomach/Bowel: The duodenum does not cross midline with jejunum in the right abdomen, compatible with mild rotation. No evidence of bowel obstruction. Mild wall thickening and mucosal hyperenhancement of small bowel loops in the left lower quadrant with a small amount of surrounding fluid, concerning for enteritis. Vascular/Lymphatic: No significant vascular findings are present. No enlarged abdominal or pelvic lymph nodes. Aorta bi-iliac atherosclerosis. Reproductive: Status post hysterectomy. Other: Small volume of free fluid in the anatomic pelvis. No evidence of free air. Musculoskeletal: Similar remote T11 compression fracture without progressive height loss. Osteopenia. IMPRESSION: 1. Mild inflammatory change of small bowel loops in the left lower quadrant with a small amount of surrounding fluid, concerning for enteritis that may be infectious, inflammatory, or ischemic. 2. No evidence of obstruction. 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 Electronically Signed   By: Margaretha Sheffield MD   On: 08/26/2020 09:55    Procedures Procedures (including critical care time)  Medications Ordered in ED Medications  iohexol (OMNIPAQUE) 300 MG/ML solution 100 mL (has no administration in time range)  ondansetron (ZOFRAN) injection 4 mg (4 mg Intravenous Given 08/26/20 0829)  iohexol (OMNIPAQUE) 300 MG/ML solution 75 mL (75 mLs Intravenous Contrast Given 08/26/20 0912)  ondansetron (ZOFRAN) injection 4 mg (4 mg Intravenous Given 08/26/20 0954)  HYDROmorphone (DILAUDID) injection 0.5 mg (0.5 mg Intravenous Given 08/26/20 0954)    ED Course  I have reviewed the triage vital signs and the nursing notes.  Pertinent labs & imaging results that were available during my care of the patient were reviewed by me and considered in my medical decision making (see chart for details).    MDM Rules/Calculators/A&P                          Patient went on to develop some abdominal pain which required some pain medication here as well as some nausea. But no vomiting. This was prior to CT scan. CT scan shows some small bowel inflammation based on her presentation doubt that this is ischemic bowel. Pain not severe and comes in waves. No evidence of bowel obstruction. No leukocytosis no fevers liver function test are normal.  We will treat symptomatically will have patient follow-up with her doctors. Will return for any new or worse symptoms.  Again CT scan showed no acute findings other than some inflammation in the small bowel. No evidence of bowel obstruction no evidence of diverticulitis. 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 But no evidence of obstruction. Do not think this is the cause of the pain. Think the inflammatory changes in the small bowel most likely the cause of the pain.     Final Clinical Impression(s) / ED Diagnoses Final diagnoses:  Lower abdominal pain    Enteritis    Rx / DC Orders ED Discharge Orders         Ordered    HYDROcodone-acetaminophen (NORCO/VICODIN) 5-325 MG tablet  Every 6 hours PRN        08/26/20 1214  Fredia Sorrow, MD 08/26/20 1229

## 2020-08-26 NOTE — Discharge Instructions (Addendum)
Today's work-up including a CAT scan of the abdomen and labs without any significant findings.  White blood cell count was normal.  Liver function tests normal.  However CT scan did show some inflammatory changes in the small bowel.  But no evidence of obstruction.  No evidence of infection in the large intestines.  Would recommend kind of liquid diet then bland diet take the pain medicine as needed.  Return for any new or worse symptoms.  Make an appointment follow-up with your regular doctor.  If symptoms do not improve and remain somewhat the same or improve a little bit and do not go away completely would make an appointment to follow-up with your regular doctor for sure.

## 2020-08-26 NOTE — ED Triage Notes (Signed)
Pt reports waiting up with intermittent lower abdominal pain with she describes as severe. No vomiting . Last BM yesterday. Denies burning with urination

## 2020-08-27 ENCOUNTER — Encounter (HOSPITAL_COMMUNITY): Payer: Self-pay | Admitting: Emergency Medicine

## 2020-08-27 ENCOUNTER — Inpatient Hospital Stay (HOSPITAL_COMMUNITY)
Admission: EM | Admit: 2020-08-27 | Discharge: 2020-09-01 | DRG: 336 | Disposition: A | Discharge status: Home Health | Payer: Medicare Other | Attending: Internal Medicine | Admitting: Internal Medicine

## 2020-08-27 ENCOUNTER — Other Ambulatory Visit: Payer: Self-pay

## 2020-08-27 ENCOUNTER — Inpatient Hospital Stay (HOSPITAL_COMMUNITY): Payer: Medicare Other

## 2020-08-27 ENCOUNTER — Emergency Department (HOSPITAL_COMMUNITY): Payer: Medicare Other

## 2020-08-27 DIAGNOSIS — Z8719 Personal history of other diseases of the digestive system: Secondary | ICD-10-CM

## 2020-08-27 DIAGNOSIS — K6389 Other specified diseases of intestine: Secondary | ICD-10-CM | POA: Diagnosis not present

## 2020-08-27 DIAGNOSIS — E039 Hypothyroidism, unspecified: Secondary | ICD-10-CM | POA: Diagnosis not present

## 2020-08-27 DIAGNOSIS — H409 Unspecified glaucoma: Secondary | ICD-10-CM | POA: Diagnosis present

## 2020-08-27 DIAGNOSIS — E860 Dehydration: Secondary | ICD-10-CM | POA: Diagnosis present

## 2020-08-27 DIAGNOSIS — K56609 Unspecified intestinal obstruction, unspecified as to partial versus complete obstruction: Secondary | ICD-10-CM

## 2020-08-27 DIAGNOSIS — Z90710 Acquired absence of both cervix and uterus: Secondary | ICD-10-CM

## 2020-08-27 DIAGNOSIS — K565 Intestinal adhesions [bands], unspecified as to partial versus complete obstruction: Principal | ICD-10-CM | POA: Diagnosis present

## 2020-08-27 DIAGNOSIS — E038 Other specified hypothyroidism: Secondary | ICD-10-CM

## 2020-08-27 DIAGNOSIS — R109 Unspecified abdominal pain: Secondary | ICD-10-CM | POA: Diagnosis not present

## 2020-08-27 DIAGNOSIS — D72829 Elevated white blood cell count, unspecified: Secondary | ICD-10-CM | POA: Diagnosis present

## 2020-08-27 DIAGNOSIS — Z79899 Other long term (current) drug therapy: Secondary | ICD-10-CM

## 2020-08-27 DIAGNOSIS — R030 Elevated blood-pressure reading, without diagnosis of hypertension: Secondary | ICD-10-CM | POA: Diagnosis not present

## 2020-08-27 DIAGNOSIS — R1032 Left lower quadrant pain: Secondary | ICD-10-CM | POA: Diagnosis not present

## 2020-08-27 DIAGNOSIS — Z20822 Contact with and (suspected) exposure to covid-19: Secondary | ICD-10-CM | POA: Diagnosis present

## 2020-08-27 DIAGNOSIS — Z79890 Hormone replacement therapy (postmenopausal): Secondary | ICD-10-CM

## 2020-08-27 DIAGNOSIS — K5939 Other megacolon: Secondary | ICD-10-CM | POA: Diagnosis not present

## 2020-08-27 DIAGNOSIS — N179 Acute kidney failure, unspecified: Secondary | ICD-10-CM | POA: Diagnosis present

## 2020-08-27 DIAGNOSIS — I10 Essential (primary) hypertension: Secondary | ICD-10-CM | POA: Diagnosis not present

## 2020-08-27 DIAGNOSIS — K529 Noninfective gastroenteritis and colitis, unspecified: Secondary | ICD-10-CM | POA: Diagnosis not present

## 2020-08-27 DIAGNOSIS — Z0189 Encounter for other specified special examinations: Secondary | ICD-10-CM

## 2020-08-27 DIAGNOSIS — Z4682 Encounter for fitting and adjustment of non-vascular catheter: Secondary | ICD-10-CM | POA: Diagnosis not present

## 2020-08-27 LAB — CBC WITH DIFFERENTIAL/PLATELET
Abs Immature Granulocytes: 0.06 10*3/uL (ref 0.00–0.07)
Basophils Absolute: 0.1 10*3/uL (ref 0.0–0.1)
Basophils Relative: 0 %
Eosinophils Absolute: 0 10*3/uL (ref 0.0–0.5)
Eosinophils Relative: 0 %
HCT: 48.4 % — ABNORMAL HIGH (ref 36.0–46.0)
Hemoglobin: 16 g/dL — ABNORMAL HIGH (ref 12.0–15.0)
Immature Granulocytes: 0 %
Lymphocytes Relative: 6 %
Lymphs Abs: 1.1 10*3/uL (ref 0.7–4.0)
MCH: 32.3 pg (ref 26.0–34.0)
MCHC: 33.1 g/dL (ref 30.0–36.0)
MCV: 97.6 fL (ref 80.0–100.0)
Monocytes Absolute: 1 10*3/uL (ref 0.1–1.0)
Monocytes Relative: 6 %
Neutro Abs: 14.9 10*3/uL — ABNORMAL HIGH (ref 1.7–7.7)
Neutrophils Relative %: 88 %
Platelets: 338 10*3/uL (ref 150–400)
RBC: 4.96 MIL/uL (ref 3.87–5.11)
RDW: 13 % (ref 11.5–15.5)
WBC: 17 10*3/uL — ABNORMAL HIGH (ref 4.0–10.5)
nRBC: 0 % (ref 0.0–0.2)

## 2020-08-27 LAB — BASIC METABOLIC PANEL
Anion gap: 13 (ref 5–15)
BUN: 26 mg/dL — ABNORMAL HIGH (ref 8–23)
CO2: 22 mmol/L (ref 22–32)
Calcium: 9.5 mg/dL (ref 8.9–10.3)
Chloride: 99 mmol/L (ref 98–111)
Creatinine, Ser: 1.08 mg/dL — ABNORMAL HIGH (ref 0.44–1.00)
GFR, Estimated: 52 mL/min — ABNORMAL LOW (ref >60)
Glucose, Bld: 139 mg/dL — ABNORMAL HIGH (ref 70–99)
Potassium: 4.3 mmol/L (ref 3.5–5.1)
Sodium: 134 mmol/L — ABNORMAL LOW (ref 135–145)

## 2020-08-27 LAB — RESPIRATORY PANEL BY RT PCR (FLU A&B, COVID)
Influenza A by PCR: NEGATIVE
Influenza B by PCR: NEGATIVE
SARS Coronavirus 2 by RT PCR: NEGATIVE

## 2020-08-27 MED ORDER — ONDANSETRON HCL 4 MG/2ML IJ SOLN
4.0000 mg | Freq: Once | INTRAMUSCULAR | Status: AC
  Administered 2020-08-27: 4 mg via INTRAVENOUS
  Filled 2020-08-27: qty 2

## 2020-08-27 MED ORDER — HYDRALAZINE HCL 20 MG/ML IJ SOLN
2.0000 mg | INTRAMUSCULAR | Status: DC | PRN
  Administered 2020-08-27: 2 mg via INTRAVENOUS
  Filled 2020-08-27: qty 1

## 2020-08-27 MED ORDER — IOHEXOL 300 MG/ML  SOLN
75.0000 mL | Freq: Once | INTRAMUSCULAR | Status: AC | PRN
  Administered 2020-08-27: 75 mL via INTRAVENOUS

## 2020-08-27 MED ORDER — ENOXAPARIN SODIUM 30 MG/0.3ML ~~LOC~~ SOLN
30.0000 mg | SUBCUTANEOUS | Status: DC
  Administered 2020-08-29: 30 mg via SUBCUTANEOUS
  Filled 2020-08-27 (×2): qty 0.3

## 2020-08-27 MED ORDER — DIATRIZOATE MEGLUMINE & SODIUM 66-10 % PO SOLN
ORAL | Status: AC
  Administered 2020-08-27: 90 mL via NASOGASTRIC
  Filled 2020-08-27: qty 90

## 2020-08-27 MED ORDER — SODIUM CHLORIDE 0.9 % IV BOLUS
500.0000 mL | Freq: Once | INTRAVENOUS | Status: AC
  Administered 2020-08-27: 500 mL via INTRAVENOUS

## 2020-08-27 MED ORDER — SODIUM CHLORIDE 0.9 % IV SOLN: INTRAVENOUS | Status: DC

## 2020-08-27 MED ORDER — HYDROMORPHONE HCL 1 MG/ML IJ SOLN
0.5000 mg | Freq: Once | INTRAMUSCULAR | Status: AC
  Administered 2020-08-27: 0.5 mg via INTRAVENOUS
  Filled 2020-08-27: qty 1

## 2020-08-27 MED ORDER — ONDANSETRON HCL 4 MG/2ML IJ SOLN
4.0000 mg | Freq: Four times a day (QID) | INTRAMUSCULAR | Status: DC | PRN
  Administered 2020-08-27 – 2020-08-31 (×5): 4 mg via INTRAVENOUS
  Filled 2020-08-27 (×6): qty 2

## 2020-08-27 MED ORDER — BRIMONIDINE TARTRATE 0.15 % OP SOLN
1.0000 [drp] | Freq: Every day | OPHTHALMIC | Status: DC
  Administered 2020-08-29 – 2020-08-31 (×3): 1 [drp] via OPHTHALMIC
  Filled 2020-08-27: qty 5

## 2020-08-27 MED ORDER — ONDANSETRON HCL 4 MG PO TABS: 4.0000 mg | ORAL_TABLET | Freq: Four times a day (QID) | ORAL | Status: DC | PRN

## 2020-08-27 MED ORDER — LATANOPROST 0.005 % OP SOLN
1.0000 [drp] | Freq: Every day | OPHTHALMIC | Status: DC
  Administered 2020-08-27 – 2020-08-31 (×5): 1 [drp] via OPHTHALMIC
  Filled 2020-08-27 (×3): qty 2.5

## 2020-08-27 MED ORDER — MORPHINE SULFATE (PF) 2 MG/ML IV SOLN
1.0000 mg | INTRAVENOUS | Status: DC | PRN
  Administered 2020-08-27 – 2020-08-30 (×4): 2 mg via INTRAVENOUS
  Administered 2020-08-30: 1 mg via INTRAVENOUS
  Filled 2020-08-27 (×5): qty 1

## 2020-08-27 MED ORDER — DIATRIZOATE MEGLUMINE & SODIUM 66-10 % PO SOLN
90.0000 mL | Freq: Once | ORAL | Status: AC
  Filled 2020-08-27: qty 90

## 2020-08-27 NOTE — ED Provider Notes (Signed)
[facility] EMERGENCY DEPARTMENT Provider Note   CSN: [id number] Arrival date & time: 08/27/20  1116     History Chief Complaint  Patient presents with  . Abdominal Pain    Megan Conley is a 84 y.o. female.  Patient seen by me yesterday.  Patient was concerned at that time that she had a small bowel obstruction she was having lower quadrant abdominal pain.  Remind her of previous obstruction.  But work-up at that time she had no leukocytosis CT scan of the abdomen was read as just some inflammation in the small bowel but no evidence of obstruction.  Patient was discharged home with some pain medicine and told to return for any new or worse symptoms in particular for persistent vomiting.  Patient did continue to have pain and did have many more episodes of vomiting.  Patient has not had a bowel movement since the onset of these symptoms.  Patient's had both Covid vaccines as well as a recent booster on 10/22.        Past Medical History:  Diagnosis Date  . Bowel obstruction ([place])   . Glaucoma   . Hypothyroidism     Patient Active Problem List   Diagnosis Date Noted  . Elevated blood pressure reading without diagnosis of hypertension 09/17/2016  . Hyperglycemia 09/17/2016  . Small bowel obstruction ([place]) 09/16/2016  . SBO (small bowel obstruction) ([place]) 09/16/2016  . Hypothyroidism 09/16/2016  . Leukocytosis 09/16/2016  . History of colonic polyps 08/25/2016    Past Surgical History:  Procedure Laterality Date  . ABDOMINAL HYSTERECTOMY    . cataract extraction both eyes    . COLONOSCOPY  06/02/2011   Procedure: COLONOSCOPY;  Surgeon: Rogene Houston, MD;  Location: [facility];  Service: Endoscopy;  Laterality: N/A;  . COLONOSCOPY N/A 03/22/2017   Procedure: COLONOSCOPY;  Surgeon: Rogene Houston, MD;  Location: [facility];  Service: Endoscopy;  Laterality: N/A;  830 - MOVED TO 5/23 @ 10:30  . POLYPECTOMY  03/22/2017   Procedure: POLYPECTOMY;  Surgeon: Rogene Houston, MD;  Location: [facility];  Service: Endoscopy;;  colon     OB History    Gravida  2   Para  2   Term  2   Preterm      AB      Living  2     SAB      TAB      Ectopic      Multiple      Live Births              Family History  Problem Relation Age of Onset  . Lung cancer Father   . Heart Problems Sister     Social History   Tobacco Use  . Smoking status: Never Smoker  . Smokeless tobacco: Never Used  Vaping Use  . Vaping Use: Never used  Substance Use Topics  . Alcohol use: Yes    Alcohol/week: 4.0 standard drinks    Types: 4 Glasses of wine per week  . Drug use: No    Home Medications Prior to Admission medications   Medication Sig Start Date End Date Taking? Authorizing Provider  ALPHAGAN P 0.1 % SOLN Apply 1 drop to eye daily.  06/30/20   [provider]  Biotin 5000 MCG CAPS Take 5,000 mcg by mouth daily.    [provider]  Calcium-Magnesium-Vitamin D (CALCIUM 1200+D3 PO) Take 1 tablet by mouth daily.  [provider]  folic acid (FOLVITE) 716 MCG tablet Take 800 mcg by mouth daily.    [provider]  HYDROcodone-acetaminophen (NORCO/VICODIN) 5-325 MG tablet Take 1 tablet by mouth every 6 (six) hours as needed for moderate pain. 08/26/20   Fredia Sorrow, MD  latanoprost (XALATAN) 0.005 % ophthalmic solution Place 1 drop into both eyes at bedtime. 09/06/16   [provider]  levothyroxine (SYNTHROID, LEVOTHROID) 50 MCG tablet Take 50 mcg by mouth daily before breakfast.    [provider]  Multiple Vitamin (MULTIVITAMIN WITH MINERALS) TABS tablet Take 1 tablet by mouth daily.    [provider]  Omega-3 Fatty Acids (FISH OIL) 1200 MG CAPS Take 1,200 mg by mouth 2 (two) times daily.    [provider]  Red Yeast Rice 600 MG CAPS Take 1,200 mg by mouth every evening.     [provider]    Allergies    Patient has no known allergies.  Review of  Systems   Review of Systems  Constitutional: Negative for chills and fever.  HENT: Negative for congestion, rhinorrhea and sore throat.   Eyes: Negative for visual disturbance.  Respiratory: Negative for cough and shortness of breath.   Cardiovascular: Negative for chest pain and leg swelling.  Gastrointestinal: Positive for abdominal pain, nausea and vomiting. Negative for diarrhea.  Genitourinary: Negative for dysuria.  Musculoskeletal: Negative for back pain and neck pain.  Skin: Negative for rash.  Neurological: Negative for dizziness, light-headedness and headaches.  Hematological: Does not bruise/bleed easily.  Psychiatric/Behavioral: Negative for confusion.    Physical Exam Updated Vital Signs BP (!) 192/94 (BP Location: Right Arm)   Pulse 81   Temp 98.2 F (36.8 C) (Oral)   Resp 20   Ht 1.499 m (4\' 11" )   Wt 53.5 kg   SpO2 98%   BMI 23.83 kg/m   Physical Exam Vitals and nursing note reviewed.  Constitutional:      General: She is not in acute distress.    Appearance: She is well-developed.  HENT:     Head: Normocephalic and atraumatic.  Eyes:     Extraocular Movements: Extraocular movements intact.     Conjunctiva/sclera: Conjunctivae normal.     Pupils: Pupils are equal, round, and reactive to light.  Cardiovascular:     Rate and Rhythm: Normal rate and regular rhythm.     Heart sounds: No murmur heard.   Pulmonary:     Effort: Pulmonary effort is normal. No respiratory distress.     Breath sounds: Normal breath sounds.  Abdominal:     General: There is no distension.     Palpations: Abdomen is soft.     Tenderness: There is no abdominal tenderness.     Comments: Abdomen today is not distended as yesterday.  Also no tenderness to palpation of the abdomen today.  Musculoskeletal:        General: Normal range of motion.     Cervical back: Normal range of motion and neck supple.  Skin:    General: Skin is warm and dry.     Capillary Refill: Capillary  refill takes less than 2 seconds.  Neurological:     General: No focal deficit present.     Mental Status: She is alert and oriented to person, place, and time.     Cranial Nerves: No cranial nerve deficit.     Sensory: No sensory deficit.     Motor: No weakness.     ED Results /  Procedures / Treatments   Labs (all labs ordered are listed, but only abnormal results are displayed) Labs Reviewed  CBC WITH DIFFERENTIAL/PLATELET - Abnormal; Notable for the following components:      Result Value   WBC 17.0 (*)    Hemoglobin 16.0 (*)    HCT 48.4 (*)    Neutro Abs 14.9 (*)    All other components within normal limits  BASIC METABOLIC PANEL - Abnormal; Notable for the following components:   Sodium 134 (*)    Glucose, Bld 139 (*)    BUN 26 (*)    Creatinine, Ser 1.08 (*)    GFR, Estimated 52 (*)    All other components within normal limits  RESPIRATORY PANEL BY RT PCR (FLU A&B, COVID)    EKG None  Radiology CT Abdomen Pelvis W Contrast  Result Date: 08/27/2020 CLINICAL DATA:  Acute left-sided abdominal pain. EXAM: CT ABDOMEN AND PELVIS WITH CONTRAST TECHNIQUE: Multidetector CT imaging of the abdomen and pelvis was performed using the standard protocol following bolus administration of intravenous contrast. CONTRAST:  59mL OMNIPAQUE IOHEXOL 300 MG/ML  SOLN COMPARISON:  August 26, 2020. FINDINGS: Lower chest: No acute abnormality. Hepatobiliary: No focal liver abnormality is seen. No gallstones, gallbladder wall thickening, or biliary dilatation. Pancreas: Unremarkable. No pancreatic ductal dilatation or surrounding inflammatory changes. Spleen: Normal in size without focal abnormality. Adrenals/Urinary Tract: Adrenal glands are unremarkable. Kidneys are normal, without renal calculi, focal lesion, or hydronephrosis. Bladder is unremarkable. Stomach/Bowel: The stomach appears normal. There is significantly increased small bowel dilatation with transition zone seen in the left lower  quadrant, best noted on image number 36 of series 5. This is concerning for adhesion. Stool is noted throughout the colon. No colonic dilatation is noted. The appendix is not visualized. Vascular/Lymphatic: Aortic atherosclerosis. No enlarged abdominal or pelvic lymph nodes. Reproductive: Status post hysterectomy. No adnexal masses. Other: Mild amount of free fluid is noted in the dependent portion the pelvis which is increased compared to prior exam. No definite hernia is noted. Musculoskeletal: No acute or significant osseous findings. IMPRESSION: 1. There is significantly increased small bowel dilatation consistent with obstruction with transition zone seen in the left lower quadrant, concerning for adhesion. Mild amount of free fluid is noted in the dependent portion of the pelvis which is increased compared to prior exam. 2. Aortic atherosclerosis. Aortic Atherosclerosis (ICD10-I70.0). Electronically Signed   By: Marijo Conception M.D.   On: 08/27/2020 14:26   CT Abdomen Pelvis W Contrast  Result Date: 08/26/2020 CLINICAL DATA:  Lower abdominal pain. EXAM: CT ABDOMEN AND PELVIS WITH CONTRAST TECHNIQUE: Multidetector CT imaging of the abdomen and pelvis was performed using the standard protocol following bolus administration of intravenous contrast. CONTRAST:  48mL OMNIPAQUE IOHEXOL 300 MG/ML  SOLN COMPARISON:  None. FINDINGS: Lower chest: Mild bibasilar atelectasis. No consolidation or pleural effusions. Hepatobiliary: No focal liver abnormality is seen. No gallstones, gallbladder wall thickening, or biliary dilatation. Pancreas: Unremarkable. No pancreatic ductal dilatation or surrounding inflammatory changes. Spleen: Normal in size without focal abnormality. Adrenals/Urinary Tract: Adrenal glands are unremarkable. Kidneys are normal, without renal calculi, focal lesion, or hydronephrosis. Bladder is unremarkable. Stomach/Bowel: The duodenum does not cross midline with jejunum in the right abdomen,  compatible with mild rotation. No evidence of bowel obstruction. Mild wall thickening and mucosal hyperenhancement of small bowel loops in the left lower quadrant with a small amount of surrounding fluid, concerning for enteritis. Vascular/Lymphatic: No significant vascular findings are present. No enlarged abdominal or pelvic  lymph nodes. Aorta bi-iliac atherosclerosis. Reproductive: Status post hysterectomy. Other: Small volume of free fluid in the anatomic pelvis. No evidence of free air. Musculoskeletal: Similar remote T11 compression fracture without progressive height loss. Osteopenia. IMPRESSION: 1. Mild inflammatory change of small bowel loops in the left lower quadrant with a small amount of surrounding fluid, concerning for enteritis that may be infectious, inflammatory, or ischemic. 2. No evidence of obstruction. 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 Electronically Signed   By: Margaretha Sheffield MD   On: 08/26/2020 09:55    Procedures Procedures (including critical care time)  Medications Ordered in ED Medications  0.9 %  sodium chloride infusion ( Intravenous New Bag/Given 08/27/20 1343)  sodium chloride 0.9 % bolus 500 mL (0 mLs Intravenous Stopped 08/27/20 1343)  ondansetron (ZOFRAN) injection 4 mg (4 mg Intravenous Given 08/27/20 1306)  HYDROmorphone (DILAUDID) injection 0.5 mg (0.5 mg Intravenous Given 08/27/20 1310)  sodium chloride 0.9 % bolus 500 mL (500 mLs Intravenous New Bag/Given 08/27/20 1429)  iohexol (OMNIPAQUE) 300 MG/ML solution 75 mL (75 mLs Intravenous Contrast Given 08/27/20 1358)    ED Course  I have reviewed the triage vital signs and the nursing notes.  Pertinent labs & imaging results that were available during my care of the patient were reviewed by me and considered in my medical decision making (see chart for details).    MDM Rules/Calculators/A&P                           Work-up here today was different with a marked  leukocytosis white count 17,000.  CT scan repeated.  Does show evidence of small bowel obstruction today.  Patient's Covid testing negative.  Patient given some antinausea medicine and some pain medicine.  Vomiting has stopped.  But she did have a lot of vomiting at home.  Will contact general surgery Dr. Constance Haw for consultation.  Then patient will most likely be hospitalist admission for small bowel obstruction.    Final Clinical Impression(s) / ED Diagnoses Final diagnoses:  SBO (small bowel obstruction) ([place])    Rx / DC Orders ED Discharge Orders    None       Fredia Sorrow, MD 08/27/20 1451

## 2020-08-27 NOTE — Consult Note (Addendum)
I was present with the medical student for this service. I personally verified the history of present illness, performed the physical exam, and made the plan for this encounter. I have verified the medical student's documentation and made modifications where appropriately. I have personally documented in my own words a brief history, physical, and plan below.     Abd pain starting yesterday and worsening. Vomiting and no BMs overnight. Had a SBO 3 years ago. Dehydrated and hemo-concentrated on labs, do no think leukocytosis related to infection but likely stress/ and concentration.   Soft, distended tender, no rebound or guarding  Personally reviewed CT- dilated bowel with transition zone in LLQ  NPO, NG, plan for SBFT tonight Discussed with radiology tech If SBFT positive for obstruction, may need surgery tomorrow EKG preop Labs in AM  Curlene Labrum, MD [facility] [place], [place] [id number] [phone] (office)    [place]  Reason for Consult: SBO Referring Physician:   Chief Complaint    Abdominal Pain      HPI: Megan Conley is a 84 y.o. female with PMH of SBO who presents w/ 2 days abdominal pain worst in LLQ w/ associated nausea and vomiting that "feels like her previous obstruction". NO PO intake or BMs in this time period. The pain started at 5:30 AM yesterday AM as intermittent pain worsened with movement; pt was seen in the ED, and CT at that time showed possible enteritis. PT returned home where pain became constant, nausea and vomiting continued. Returned to ED today and repeat CT concerning for SBO. Pt seen w/ daughter at bedside. Pt and daughter report daily BMs w/ loose stools in the past. Report improved nausea and pain control w/ ED meds (hydralazine, Zofran, dilaudid,omnipaque). Pt and daughter deny medication allergies, blood thinner use. Pt's daughter also reports pt has hx of memory  loss and difficulty hearing.  Past Medical History:  Diagnosis Date  . Bowel obstruction ([place])   . Glaucoma   . Hypothyroidism     Past Surgical History:  Procedure Laterality Date  . ABDOMINAL HYSTERECTOMY    . cataract extraction both eyes    . COLONOSCOPY  06/02/2011   Procedure: COLONOSCOPY;  Surgeon: Rogene Houston, MD;  [facility];  Service: Endoscopy;  Laterality: N/A;  . COLONOSCOPY N/A 03/22/2017   Procedure: COLONOSCOPY;  Surgeon: Rogene Houston, MD;  Location: [facility];  Service: Endoscopy;  Laterality: N/A;  830 - MOVED TO 5/23 @ 10:30  . POLYPECTOMY  03/22/2017   Procedure: POLYPECTOMY;  Surgeon: Rogene Houston, MD;  Location: [facility];  Service: Endoscopy;;  colon    Family History  Problem Relation Age of Onset  . Lung cancer Father   . Heart Problems Sister     Social History   Tobacco Use  . Smoking status: Never Smoker  . Smokeless tobacco: Never Used  Vaping Use  . Vaping Use: Never used  Substance Use Topics  . Alcohol use: Yes    Alcohol/week: 4.0 standard drinks    Types: 4 Glasses of wine per week  . Drug use: No    Medications: I have reviewed the patient's current medications. Current Facility-Administered Medications  Medication Dose Route Frequency Provider Last Rate Last Admin  . 0.9 %  sodium chloride infusion   Intravenous Continuous Fredia Sorrow, MD 75 mL/hr at 08/27/20 1343 New Bag at 08/27/20 1343  . diatrizoate meglumine-sodium (GASTROGRAFIN) 66-10 % solution  90 mL  90 mL Per NG tube Once Virl Cagey, MD      . diatrizoate meglumine-sodium (GASTROGRAFIN) 66-10 % solution           . hydrALAZINE (APRESOLINE) injection 2 mg  2 mg Intravenous Q4H PRN Elgergawy, Silver Huguenin, MD   2 mg at 08/27/20 1611  . morphine 2 MG/ML injection 1-2 mg  1-2 mg Intravenous Q4H PRN Elgergawy, Silver Huguenin, MD       Current Outpatient Medications  Medication Sig Dispense Refill Last Dose  . ALPHAGAN P 0.1 % SOLN Apply 1 drop  to eye daily.      . Biotin 5000 MCG CAPS Take 5,000 mcg by mouth daily.     . Calcium-Magnesium-Vitamin D (CALCIUM 1200+D3 PO) Take 1 tablet by mouth daily.     . folic acid (FOLVITE) 643 MCG tablet Take 800 mcg by mouth daily.     Marland Kitchen HYDROcodone-acetaminophen (NORCO/VICODIN) 5-325 MG tablet Take 1 tablet by mouth every 6 (six) hours as needed for moderate pain. 12 tablet 0   . latanoprost (XALATAN) 0.005 % ophthalmic solution Place 1 drop into both eyes at bedtime.     Marland Kitchen levothyroxine (SYNTHROID, LEVOTHROID) 50 MCG tablet Take 50 mcg by mouth daily before breakfast.     . Multiple Vitamin (MULTIVITAMIN WITH MINERALS) TABS tablet Take 1 tablet by mouth daily.     . Omega-3 Fatty Acids (FISH OIL) 1200 MG CAPS Take 1,200 mg by mouth 2 (two) times daily.     . Red Yeast Rice 600 MG CAPS Take 1,200 mg by mouth every evening.       No Known Allergies   ROS:  Pertinent items are noted in HPI.  Blood pressure (!) 165/80, pulse 91, temperature 98.2 F (36.8 C), temperature source Oral, resp. rate 20, height 4\' 11"  (1.499 m), weight 53.5 kg, SpO2 96 %. Physical Exam Constitutional:      Appearance: She is well-developed.  HENT:     Head: Normocephalic and atraumatic.  Cardiovascular:     Rate and Rhythm: Normal rate and regular rhythm.     Heart sounds: Normal heart sounds.  Pulmonary:     Effort: Pulmonary effort is normal.     Breath sounds: Normal breath sounds.  Abdominal:     General: Bowel sounds are normal. There is distension.     Palpations: Abdomen is soft.     Tenderness: There is abdominal tenderness in the epigastric area and left lower quadrant. There is rebound. There is no guarding.  Musculoskeletal:     Comments: Moves extremities  Skin:    General: Skin is warm and dry.  Neurological:     General: No focal deficit present.     Mental Status: She is alert and oriented to person, place, and time.  Psychiatric:        Mood and Affect: Mood normal.        Behavior:  Behavior normal.     Results: Results for orders placed or performed during the hospital encounter of 08/27/20 (from the past 48 hour(s))  CBC with Differential/Platelet     Status: Abnormal   Collection Time: 08/27/20 12:00 PM  Result Value Ref Range   WBC 17.0 (H) 4.0 - 10.5 K/uL   RBC 4.96 3.87 - 5.11 MIL/uL   Hemoglobin 16.0 (H) 12.0 - 15.0 g/dL   HCT 48.4 (H) 36 - 46 %   MCV 97.6 80.0 - 100.0 fL   MCH 32.3 26.0 -  34.0 pg   MCHC 33.1 30.0 - 36.0 g/dL   RDW 13.0 11.5 - 15.5 %   Platelets 338 150 - 400 K/uL   nRBC 0.0 0.0 - 0.2 %   Neutrophils Relative % 88 %   Neutro Abs 14.9 (H) 1.7 - 7.7 K/uL   Lymphocytes Relative 6 %   Lymphs Abs 1.1 0.7 - 4.0 K/uL   Monocytes Relative 6 %   Monocytes Absolute 1.0 0.1 - 1.0 K/uL   Eosinophils Relative 0 %   Eosinophils Absolute 0.0 0.0 - 0.5 K/uL   Basophils Relative 0 %   Basophils Absolute 0.1 0.0 - 0.1 K/uL   Immature Granulocytes 0 %   Abs Immature Granulocytes 0.06 0.00 - 0.07 K/uL    Comment: Performed at [facility], [address] [place], [place] [address]  Basic metabolic panel     Status: Abnormal   Collection Time: 08/27/20 12:00 PM  Result Value Ref Range   Sodium 134 (L) 135 - 145 mmol/L   Potassium 4.3 3.5 - 5.1 mmol/L   Chloride 99 98 - 111 mmol/L   CO2 22 22 - 32 mmol/L   Glucose, Bld 139 (H) 70 - 99 mg/dL    Comment: Glucose reference range applies only to samples taken after fasting for at least 8 hours.   BUN 26 (H) 8 - 23 mg/dL   Creatinine, Ser 1.08 (H) 0.44 - 1.00 mg/dL   Calcium 9.5 8.9 - 10.3 mg/dL   GFR, Estimated 52 (L) >60 mL/min    Comment: (NOTE) Calculated using the CKD-EPI Creatinine Equation (2021)    Anion gap 13 5 - 15    Comment: Performed at [facility], [address], [place], [place] [address]  Respiratory Panel by RT PCR (Flu A&B, Covid) - Nasopharyngeal Swab     Status: None   Collection Time: 08/27/20 12:26 PM   Specimen: Nasopharyngeal Swab  Result Value Ref Range   SARS  Coronavirus 2 by RT PCR NEGATIVE NEGATIVE    Comment: (NOTE) SARS-CoV-2 target nucleic acids are NOT DETECTED.  The SARS-CoV-2 RNA is generally detectable in upper respiratoy specimens during the acute phase of infection. The lowest concentration of SARS-CoV-2 viral copies this assay can detect is 131 copies/mL. A negative result does not preclude SARS-Cov-2 infection and should not be used as the sole basis for treatment or other patient management decisions. A negative result may occur with  improper specimen collection/handling, submission of specimen other than nasopharyngeal swab, presence of viral mutation(s) within the areas targeted by this assay, and inadequate number of viral copies (<131 copies/mL). A negative result must be combined with clinical observations, patient history, and epidemiological information. The expected result is Negative.  Fact Sheet for Patients:  PinkCheek.be  Fact Sheet for Healthcare Providers:  GravelBags.it  This test is no t yet approved or cleared by the [place] FDA and  has been authorized for detection and/or diagnosis of SARS-CoV-2 by FDA under an Emergency Use Authorization (EUA). This EUA will remain  in effect (meaning this test can be used) for the duration of the COVID-19 declaration under Section 564(b)(1) of the Act, 21 U.S.C. section 360bbb-3(b)(1), unless the authorization is terminated or revoked sooner.     Influenza A by PCR NEGATIVE NEGATIVE   Influenza B by PCR NEGATIVE NEGATIVE    Comment: (NOTE) The Xpert Xpress SARS-CoV-2/FLU/RSV assay is intended as an aid in  the diagnosis of influenza from Nasopharyngeal swab specimens and  should not be  used as a sole basis for treatment. Nasal washings and  aspirates are unacceptable for Xpert Xpress SARS-CoV-2/FLU/RSV  testing.  Fact Sheet for Patients: PinkCheek.be  Fact Sheet for  Healthcare Providers: GravelBags.it  This test is not yet approved or cleared by the [place] FDA and  has been authorized for detection and/or diagnosis of SARS-CoV-2 by  FDA under an Emergency Use Authorization (EUA). This EUA will remain  in effect (meaning this test can be used) for the duration of the  Covid-19 declaration under Section 564(b)(1) of the Act, 21  U.S.C. section 360bbb-3(b)(1), unless the authorization is  terminated or revoked. Performed at [facility], [address], [place], [place] [address]     CT Abdomen Pelvis W Contrast  Result Date: 08/27/2020 CLINICAL DATA:  Acute left-sided abdominal pain. EXAM: CT ABDOMEN AND PELVIS WITH CONTRAST TECHNIQUE: Multidetector CT imaging of the abdomen and pelvis was performed using the standard protocol following bolus administration of intravenous contrast. CONTRAST:  29mL OMNIPAQUE IOHEXOL 300 MG/ML  SOLN COMPARISON:  August 26, 2020. FINDINGS: Lower chest: No acute abnormality. Hepatobiliary: No focal liver abnormality is seen. No gallstones, gallbladder wall thickening, or biliary dilatation. Pancreas: Unremarkable. No pancreatic ductal dilatation or surrounding inflammatory changes. Spleen: Normal in size without focal abnormality. Adrenals/Urinary Tract: Adrenal glands are unremarkable. Kidneys are normal, without renal calculi, focal lesion, or hydronephrosis. Bladder is unremarkable. Stomach/Bowel: The stomach appears normal. There is significantly increased small bowel dilatation with transition zone seen in the left lower quadrant, best noted on image number 36 of series 5. This is concerning for adhesion. Stool is noted throughout the colon. No colonic dilatation is noted. The appendix is not visualized. Vascular/Lymphatic: Aortic atherosclerosis. No enlarged abdominal or pelvic lymph nodes. Reproductive: Status post hysterectomy. No adnexal masses. Other: Mild amount of free fluid is noted in  the dependent portion the pelvis which is increased compared to prior exam. No definite hernia is noted. Musculoskeletal: No acute or significant osseous findings. IMPRESSION: 1. There is significantly increased small bowel dilatation consistent with obstruction with transition zone seen in the left lower quadrant, concerning for adhesion. Mild amount of free fluid is noted in the dependent portion of the pelvis which is increased compared to prior exam. 2. Aortic atherosclerosis. Aortic Atherosclerosis (ICD10-I70.0). Electronically Signed   By: Marijo Conception M.D.   On: 08/27/2020 14:26   CT Abdomen Pelvis W Contrast  Result Date: 08/26/2020 CLINICAL DATA:  Lower abdominal pain. EXAM: CT ABDOMEN AND PELVIS WITH CONTRAST TECHNIQUE: Multidetector CT imaging of the abdomen and pelvis was performed using the standard protocol following bolus administration of intravenous contrast. CONTRAST:  64mL OMNIPAQUE IOHEXOL 300 MG/ML  SOLN COMPARISON:  None. FINDINGS: Lower chest: Mild bibasilar atelectasis. No consolidation or pleural effusions. Hepatobiliary: No focal liver abnormality is seen. No gallstones, gallbladder wall thickening, or biliary dilatation. Pancreas: Unremarkable. No pancreatic ductal dilatation or surrounding inflammatory changes. Spleen: Normal in size without focal abnormality. Adrenals/Urinary Tract: Adrenal glands are unremarkable. Kidneys are normal, without renal calculi, focal lesion, or hydronephrosis. Bladder is unremarkable. Stomach/Bowel: The duodenum does not cross midline with jejunum in the right abdomen, compatible with mild rotation. No evidence of bowel obstruction. Mild wall thickening and mucosal hyperenhancement of small bowel loops in the left lower quadrant with a small amount of surrounding fluid, concerning for enteritis. Vascular/Lymphatic: No significant vascular findings are present. No enlarged abdominal or pelvic lymph nodes. Aorta bi-iliac atherosclerosis. Reproductive:  Status post hysterectomy. Other: Small volume of free fluid  in the anatomic pelvis. No evidence of free air. Musculoskeletal: Similar remote T11 compression fracture without progressive height loss. Osteopenia. IMPRESSION: 1. Mild inflammatory change of small bowel loops in the left lower quadrant with a small amount of surrounding fluid, concerning for enteritis that may be infectious, inflammatory, or ischemic. 2. No evidence of obstruction. 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 Electronically Signed   By: Margaretha Sheffield MD   On: 08/26/2020 09:55     Assessment & Plan:  Megan Conley is a 84 y.o. female here for mgmt of SBO following 2 day n/v and abdominal pain.   - NG tube w/ Small Bowel Follow-through today, possible surgery tomorrow pending follow-through results - IV Fluids - Trend WBC  All questions were answered to the satisfaction of the patient and family (daughter [name]).   Raliegh Ip 08/27/2020, 4:58 PM

## 2020-08-27 NOTE — H&P (View-Only) (Signed)
I was present with the medical student for this service. I personally verified the history of present illness, performed the physical exam, and made the plan for this encounter. I have verified the medical student's documentation and made modifications where appropriately. I have personally documented in my own words a brief history, physical, and plan below.     Abd pain starting yesterday and worsening. Vomiting and no BMs overnight. Had a SBO 3 years ago. Dehydrated and hemo-concentrated on labs, do no think leukocytosis related to infection but likely stress/ and concentration.   Soft, distended tender, no rebound or guarding  Personally reviewed CT- dilated bowel with transition zone in LLQ  NPO, NG, plan for SBFT tonight Discussed with radiology tech If SBFT positive for obstruction, may need surgery tomorrow EKG preop Labs in AM  Curlene Labrum, MD [facility] [place], [place] [address] [phone] (office)    [place]  Reason for Consult: SBO Referring Physician:   Chief Complaint    Abdominal Pain      HPI: Megan Conley is a 84 y.o. female with PMH of SBO who presents w/ 2 days abdominal pain worst in LLQ w/ associated nausea and vomiting that "feels like her previous obstruction". NO PO intake or BMs in this time period. The pain started at 5:30 AM yesterday AM as intermittent pain worsened with movement; pt was seen in the ED, and CT at that time showed possible enteritis. PT returned home where pain became constant, nausea and vomiting continued. Returned to ED today and repeat CT concerning for SBO. Pt seen w/ daughter at bedside. Pt and daughter report daily BMs w/ loose stools in the past. Report improved nausea and pain control w/ ED meds (hydralazine, Zofran, dilaudid,omnipaque). Pt and daughter deny medication allergies, blood thinner use. Pt's daughter also reports pt has hx of memory  loss and difficulty hearing.  Past Medical History:  Diagnosis Date  . Bowel obstruction ([place])   . Glaucoma   . Hypothyroidism     Past Surgical History:  Procedure Laterality Date  . ABDOMINAL HYSTERECTOMY    . cataract extraction both eyes    . COLONOSCOPY  06/02/2011   Procedure: COLONOSCOPY;  Surgeon: Rogene Houston, MD;  [facility];  Service: Endoscopy;  Laterality: N/A;  . COLONOSCOPY N/A 03/22/2017   Procedure: COLONOSCOPY;  Surgeon: Rogene Houston, MD;  [facility];  Service: Endoscopy;  Laterality: N/A;  830 - MOVED TO 5/23 @ 10:30  . POLYPECTOMY  03/22/2017   Procedure: POLYPECTOMY;  Surgeon: Rogene Houston, MD;  [facility]: [facility];  Service: Endoscopy;;  colon    Family History  Problem Relation Age of Onset  . Lung cancer Father   . Heart Problems Sister     Social History   Tobacco Use  . Smoking status: Never Smoker  . Smokeless tobacco: Never Used  Vaping Use  . Vaping Use: Never used  Substance Use Topics  . Alcohol use: Yes    Alcohol/week: 4.0 standard drinks    Types: 4 Glasses of wine per week  . Drug use: No    Medications: I have reviewed the patient's current medications. Current Facility-Administered Medications  Medication Dose Route Frequency Provider Last Rate Last Admin  . 0.9 %  sodium chloride infusion   Intravenous Continuous Fredia Sorrow, MD 75 mL/hr at 08/27/20 1343 New Bag at 08/27/20 1343  . diatrizoate meglumine-sodium (GASTROGRAFIN) 66-10 % solution  90 mL  90 mL Per NG tube Once Virl Cagey, MD      . diatrizoate meglumine-sodium (GASTROGRAFIN) 66-10 % solution           . hydrALAZINE (APRESOLINE) injection 2 mg  2 mg Intravenous Q4H PRN Elgergawy, Silver Huguenin, MD   2 mg at 08/27/20 1611  . morphine 2 MG/ML injection 1-2 mg  1-2 mg Intravenous Q4H PRN Elgergawy, Silver Huguenin, MD       Current Outpatient Medications  Medication Sig Dispense Refill Last Dose  . ALPHAGAN P 0.1 % SOLN Apply 1 drop  to eye daily.      . Biotin 5000 MCG CAPS Take 5,000 mcg by mouth daily.     . Calcium-Magnesium-Vitamin D (CALCIUM 1200+D3 PO) Take 1 tablet by mouth daily.     . folic acid (FOLVITE) 948 MCG tablet Take 800 mcg by mouth daily.     Marland Kitchen HYDROcodone-acetaminophen (NORCO/VICODIN) 5-325 MG tablet Take 1 tablet by mouth every 6 (six) hours as needed for moderate pain. 12 tablet 0   . latanoprost (XALATAN) 0.005 % ophthalmic solution Place 1 drop into both eyes at bedtime.     Marland Kitchen levothyroxine (SYNTHROID, LEVOTHROID) 50 MCG tablet Take 50 mcg by mouth daily before breakfast.     . Multiple Vitamin (MULTIVITAMIN WITH MINERALS) TABS tablet Take 1 tablet by mouth daily.     . Omega-3 Fatty Acids (FISH OIL) 1200 MG CAPS Take 1,200 mg by mouth 2 (two) times daily.     . Red Yeast Rice 600 MG CAPS Take 1,200 mg by mouth every evening.       No Known Allergies   ROS:  Pertinent items are noted in HPI.  Blood pressure (!) 165/80, pulse 91, temperature 98.2 F (36.8 C), temperature source Oral, resp. rate 20, height 4\' 11"  (1.499 m), weight 53.5 kg, SpO2 96 %. Physical Exam Constitutional:      Appearance: She is well-developed.  HENT:     Head: Normocephalic and atraumatic.  Cardiovascular:     Rate and Rhythm: Normal rate and regular rhythm.     Heart sounds: Normal heart sounds.  Pulmonary:     Effort: Pulmonary effort is normal.     Breath sounds: Normal breath sounds.  Abdominal:     General: Bowel sounds are normal. There is distension.     Palpations: Abdomen is soft.     Tenderness: There is abdominal tenderness in the epigastric area and left lower quadrant. There is rebound. There is no guarding.  Musculoskeletal:     Comments: Moves extremities  Skin:    General: Skin is warm and dry.  Neurological:     General: No focal deficit present.     Mental Status: She is alert and oriented to person, place, and time.  Psychiatric:        Mood and Affect: Mood normal.        Behavior:  Behavior normal.     Results: Results for orders placed or performed during the hospital encounter of 08/27/20 (from the past 48 hour(s))  CBC with Differential/Platelet     Status: Abnormal   Collection Time: 08/27/20 12:00 PM  Result Value Ref Range   WBC 17.0 (H) 4.0 - 10.5 K/uL   RBC 4.96 3.87 - 5.11 MIL/uL   Hemoglobin 16.0 (H) 12.0 - 15.0 g/dL   HCT 48.4 (H) 36 - 46 %   MCV 97.6 80.0 - 100.0 fL   MCH 32.3 26.0 -  34.0 pg   MCHC 33.1 30.0 - 36.0 g/dL   RDW 13.0 11.5 - 15.5 %   Platelets 338 150 - 400 K/uL   nRBC 0.0 0.0 - 0.2 %   Neutrophils Relative % 88 %   Neutro Abs 14.9 (H) 1.7 - 7.7 K/uL   Lymphocytes Relative 6 %   Lymphs Abs 1.1 0.7 - 4.0 K/uL   Monocytes Relative 6 %   Monocytes Absolute 1.0 0.1 - 1.0 K/uL   Eosinophils Relative 0 %   Eosinophils Absolute 0.0 0.0 - 0.5 K/uL   Basophils Relative 0 %   Basophils Absolute 0.1 0.0 - 0.1 K/uL   Immature Granulocytes 0 %   Abs Immature Granulocytes 0.06 0.00 - 0.07 K/uL    Comment: Performed at [facility], [address], [place], [place] [address]  Basic metabolic panel     Status: Abnormal   Collection Time: 08/27/20 12:00 PM  Result Value Ref Range   Sodium 134 (L) 135 - 145 mmol/L   Potassium 4.3 3.5 - 5.1 mmol/L   Chloride 99 98 - 111 mmol/L   CO2 22 22 - 32 mmol/L   Glucose, Bld 139 (H) 70 - 99 mg/dL    Comment: Glucose reference range applies only to samples taken after fasting for at least 8 hours.   BUN 26 (H) 8 - 23 mg/dL   Creatinine, Ser 1.08 (H) 0.44 - 1.00 mg/dL   Calcium 9.5 8.9 - 10.3 mg/dL   GFR, Estimated 52 (L) >60 mL/min    Comment: (NOTE) Calculated using the CKD-EPI Creatinine Equation (2021)    Anion gap 13 5 - 15    Comment: Performed at [facility], [address], [place], [place] [address]  Respiratory Panel by RT PCR (Flu A&B, Covid) - Nasopharyngeal Swab     Status: None   Collection Time: 08/27/20 12:26 PM   Specimen: Nasopharyngeal Swab  Result Value Ref Range   SARS  Coronavirus 2 by RT PCR NEGATIVE NEGATIVE    Comment: (NOTE) SARS-CoV-2 target nucleic acids are NOT DETECTED.  The SARS-CoV-2 RNA is generally detectable in upper respiratoy specimens during the acute phase of infection. The lowest concentration of SARS-CoV-2 viral copies this assay can detect is 131 copies/mL. A negative result does not preclude SARS-Cov-2 infection and should not be used as the sole basis for treatment or other patient management decisions. A negative result may occur with  improper specimen collection/handling, submission of specimen other than nasopharyngeal swab, presence of viral mutation(s) within the areas targeted by this assay, and inadequate number of viral copies (<131 copies/mL). A negative result must be combined with clinical observations, patient history, and epidemiological information. The expected result is Negative.  Fact Sheet for Patients:  PinkCheek.be  Fact Sheet for Healthcare Providers:  GravelBags.it  This test is no t yet approved or cleared by the [place] FDA and  has been authorized for detection and/or diagnosis of SARS-CoV-2 by FDA under an Emergency Use Authorization (EUA). This EUA will remain  in effect (meaning this test can be used) for the duration of the COVID-19 declaration under Section 564(b)(1) of the Act, 21 U.S.C. section 360bbb-3(b)(1), unless the authorization is terminated or revoked sooner.     Influenza A by PCR NEGATIVE NEGATIVE   Influenza B by PCR NEGATIVE NEGATIVE    Comment: (NOTE) The Xpert Xpress SARS-CoV-2/FLU/RSV assay is intended as an aid in  the diagnosis of influenza from Nasopharyngeal swab specimens and  should not be  used as a sole basis for treatment. Nasal washings and  aspirates are unacceptable for Xpert Xpress SARS-CoV-2/FLU/RSV  testing.  Fact Sheet for Patients: PinkCheek.be  Fact Sheet for  Healthcare Providers: GravelBags.it  This test is not yet approved or cleared by the [place] FDA and  has been authorized for detection and/or diagnosis of SARS-CoV-2 by  FDA under an Emergency Use Authorization (EUA). This EUA will remain  in effect (meaning this test can be used) for the duration of the  Covid-19 declaration under Section 564(b)(1) of the Act, 21  U.S.C. section 360bbb-3(b)(1), unless the authorization is  terminated or revoked. Performed at [facility], [address], [place], [place] [address]     CT Abdomen Pelvis W Contrast  Result Date: 08/27/2020 CLINICAL DATA:  Acute left-sided abdominal pain. EXAM: CT ABDOMEN AND PELVIS WITH CONTRAST TECHNIQUE: Multidetector CT imaging of the abdomen and pelvis was performed using the standard protocol following bolus administration of intravenous contrast. CONTRAST:  67mL OMNIPAQUE IOHEXOL 300 MG/ML  SOLN COMPARISON:  August 26, 2020. FINDINGS: Lower chest: No acute abnormality. Hepatobiliary: No focal liver abnormality is seen. No gallstones, gallbladder wall thickening, or biliary dilatation. Pancreas: Unremarkable. No pancreatic ductal dilatation or surrounding inflammatory changes. Spleen: Normal in size without focal abnormality. Adrenals/Urinary Tract: Adrenal glands are unremarkable. Kidneys are normal, without renal calculi, focal lesion, or hydronephrosis. Bladder is unremarkable. Stomach/Bowel: The stomach appears normal. There is significantly increased small bowel dilatation with transition zone seen in the left lower quadrant, best noted on image number 36 of series 5. This is concerning for adhesion. Stool is noted throughout the colon. No colonic dilatation is noted. The appendix is not visualized. Vascular/Lymphatic: Aortic atherosclerosis. No enlarged abdominal or pelvic lymph nodes. Reproductive: Status post hysterectomy. No adnexal masses. Other: Mild amount of free fluid is noted in  the dependent portion the pelvis which is increased compared to prior exam. No definite hernia is noted. Musculoskeletal: No acute or significant osseous findings. IMPRESSION: 1. There is significantly increased small bowel dilatation consistent with obstruction with transition zone seen in the left lower quadrant, concerning for adhesion. Mild amount of free fluid is noted in the dependent portion of the pelvis which is increased compared to prior exam. 2. Aortic atherosclerosis. Aortic Atherosclerosis (ICD10-I70.0). Electronically Signed   By: Marijo Conception M.D.   On: 08/27/2020 14:26   CT Abdomen Pelvis W Contrast  Result Date: 08/26/2020 CLINICAL DATA:  Lower abdominal pain. EXAM: CT ABDOMEN AND PELVIS WITH CONTRAST TECHNIQUE: Multidetector CT imaging of the abdomen and pelvis was performed using the standard protocol following bolus administration of intravenous contrast. CONTRAST:  11mL OMNIPAQUE IOHEXOL 300 MG/ML  SOLN COMPARISON:  None. FINDINGS: Lower chest: Mild bibasilar atelectasis. No consolidation or pleural effusions. Hepatobiliary: No focal liver abnormality is seen. No gallstones, gallbladder wall thickening, or biliary dilatation. Pancreas: Unremarkable. No pancreatic ductal dilatation or surrounding inflammatory changes. Spleen: Normal in size without focal abnormality. Adrenals/Urinary Tract: Adrenal glands are unremarkable. Kidneys are normal, without renal calculi, focal lesion, or hydronephrosis. Bladder is unremarkable. Stomach/Bowel: The duodenum does not cross midline with jejunum in the right abdomen, compatible with mild rotation. No evidence of bowel obstruction. Mild wall thickening and mucosal hyperenhancement of small bowel loops in the left lower quadrant with a small amount of surrounding fluid, concerning for enteritis. Vascular/Lymphatic: No significant vascular findings are present. No enlarged abdominal or pelvic lymph nodes. Aorta bi-iliac atherosclerosis. Reproductive:  Status post hysterectomy. Other: Small volume of free fluid  in the anatomic pelvis. No evidence of free air. Musculoskeletal: Similar remote T11 compression fracture without progressive height loss. Osteopenia. IMPRESSION: 1. Mild inflammatory change of small bowel loops in the left lower quadrant with a small amount of surrounding fluid, concerning for enteritis that may be infectious, inflammatory, or ischemic. 2. No evidence of obstruction. 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 Electronically Signed   By: Margaretha Sheffield MD   On: 08/26/2020 09:55     Assessment & Plan:  Megan Conley is a 84 y.o. female here for mgmt of SBO following 2 day n/v and abdominal pain.   - NG tube w/ Small Bowel Follow-through today, possible surgery tomorrow pending follow-through results - IV Fluids - Trend WBC  All questions were answered to the satisfaction of the patient and family (daughter Megan Conley).   Raliegh Ip 08/27/2020, 4:58 PM

## 2020-08-27 NOTE — H&P (Signed)
[facility] H&P   Patient Demographics:    Megan Conley, is a 84 y.o. female  MRN: [id number]   DOB - [birth year]  Admit Date - 08/27/2020  Outpatient Primary MD for the patient is Celene Squibb, MD  Referring MD/NP/PA: Dr Rogene Houston  Patient coming from: Home  Chief Complaint  Patient presents with  . Abdominal Pain      HPI:    Megan Conley  is a 84 y.o. female, with  history of hypothyroidism, history of previous small bowel obstruction in 2017 resolved without surgical intervention, patient presents to ED secondary to complaints of abdominal pain, she reports nausea, vomiting over last 24 hours, abdominal pain over last 48 hours, reports few episodes of vomiting, bilious, reports abdominal pain mainly in the left lower quadrant, she denies fever, chills, chest pain, shortness of breath, or diarrhea, last bowel movement before yesterday morning, she reports the symptoms samples previous episode of SBO she had in the past, she had surgical history including open abdominal hysterectomy back in 1978, she came to ED yesterday, her CT scan of abdomen was read as just some inflammation in the small bowels, with no evidence of obstruction, and reports she is vaccinated against Covid with recent booster on 10/22. - in ED patient with significant pain requiring IV Dilaudid, blood pressure is elevated but did improve with pain medications, she had elevated creatinine at 1.08 from baseline of 0.8, she had leukocytosis at 17 K, CT abdomen with contrast showing small bowel dilation consistent with obstruction with transition zone seen in the left lower quadrant concerning for adhesions, she received IV fluids, pain medication, nausea medication, and [facility] hospitalist consulted to admit.   Review of systems:    In addition to the HPI above,  No Fever-chills, does report poor appetite No Headache, No  changes with Vision or hearing, No problems swallowing food or Liquids, No Chest pain, Cough or Shortness of Breath, Planes of abdominal pain, nausea, vomiting, last bowel movement before yesterday morning, not passing gas since.   No Blood in stool or Urine, No dysuria, No new skin rashes or bruises, No new joints pains-aches,  No new weakness, tingling, numbness in any extremity, No recent weight gain or loss, No polyuria, polydypsia or polyphagia, No significant Mental Stressors.  A full 10 point Review of Systems was done, except as stated above, all other Review of Systems were negative.   With Past History of the following :    Past Medical History:  Diagnosis Date  . Bowel obstruction ([place])   . Glaucoma   . Hypothyroidism       Past Surgical History:  Procedure Laterality Date  . ABDOMINAL HYSTERECTOMY    . cataract extraction both eyes    . COLONOSCOPY  06/02/2011   Procedure: COLONOSCOPY;  Surgeon: Rogene Houston, MD;  [facility]: [facility];  Service: Endoscopy;  Laterality: N/A;  .  COLONOSCOPY N/A 03/22/2017   Procedure: COLONOSCOPY;  Surgeon: Rogene Houston, MD;  Location: [facility];  Service: Endoscopy;  Laterality: N/A;  830 - MOVED TO 5/23 @ 10:30  . POLYPECTOMY  03/22/2017   Procedure: POLYPECTOMY;  Surgeon: Rogene Houston, MD;  Location: [facility];  Service: Endoscopy;;  colon      Social History:     Social History   Tobacco Use  . Smoking status: Never Smoker  . Smokeless tobacco: Never Used  Substance Use Topics  . Alcohol use: Yes    Alcohol/week: 4.0 standard drinks    Types: 4 Glasses of wine per week         Family History :     Family History  Problem Relation Age of Onset  . Lung cancer Father   . Heart Problems Sister      Home Medications:   Prior to Admission medications   Medication Sig Start Date End Date Taking? Authorizing Provider  ALPHAGAN P 0.1 % SOLN Apply 1 drop to eye daily.  06/30/20   [provider]  Biotin 5000 MCG CAPS Take 5,000 mcg by mouth daily.    [provider]  Calcium-Magnesium-Vitamin D (CALCIUM 1200+D3 PO) Take 1 tablet by mouth daily.    [provider]  folic acid (FOLVITE) 027 MCG tablet Take 800 mcg by mouth daily.    [provider]  HYDROcodone-acetaminophen (NORCO/VICODIN) 5-325 MG tablet Take 1 tablet by mouth every 6 (six) hours as needed for moderate pain. 08/26/20   Fredia Sorrow, MD  latanoprost (XALATAN) 0.005 % ophthalmic solution Place 1 drop into both eyes at bedtime. 09/06/16   [provider]  levothyroxine (SYNTHROID, LEVOTHROID) 50 MCG tablet Take 50 mcg by mouth daily before breakfast.    [provider]  Multiple Vitamin (MULTIVITAMIN WITH MINERALS) TABS tablet Take 1 tablet by mouth daily.    [provider]  Omega-3 Fatty Acids (FISH OIL) 1200 MG CAPS Take 1,200 mg by mouth 2 (two) times daily.    [provider]  Red Yeast Rice 600 MG CAPS Take 1,200 mg by mouth every evening.     [provider]     Allergies:    No Known Allergies   Physical Exam:   Vitals  Blood pressure (!) 192/94, pulse 81, temperature 98.2 F (36.8 C), temperature source Oral, resp. rate 20, height 4\' 11"  (1.499 m), weight 53.5 kg, SpO2 98 %.   1. General elderly female, laying in bed, no apparent distress.  2. Normal affect and insight, Not Suicidal or Homicidal, Awake Alert, Oriented X 3.  3. No F.N deficits, ALL C.Nerves Intact, Strength 5/5 all 4 extremities, Sensation intact all 4 extremities, Plantars down going.  4. Ears and Eyes appear Normal, Conjunctivae clear, PERRLA.  Dry oral Mucosa.  5. Supple Neck, No JVD, No cervical lymphadenopathy appriciated, No Carotid Bruits.  6. Symmetrical Chest wall movement, Good air movement bilaterally, CTAB.  7. RRR, No Gallops, Rubs or Murmurs, No Parasternal Heave.  8.  she has tenderness, more pronounced in the left lower  quadrant, bowel sounds present, , No organomegaly appriciated,No rebound -guarding or rigidity.  9.  No Cyanosis, Normal Skin Turgor, No Skin Rash or Bruise.  10. Good muscle tone,  joints appear normal , no effusions, Normal ROM.  11. No Palpable Lymph Nodes in Neck or Axillae     Data Review:    CBC Recent Labs  Lab 08/26/20 0814 08/27/20  1200  WBC 6.9 17.0*  HGB 14.0 16.0*  HCT 43.8 48.4*  PLT 318 338  MCV 98.0 97.6  MCH 31.3 32.3  MCHC 32.0 33.1  RDW 12.5 13.0  LYMPHSABS  --  1.1  MONOABS  --  1.0  EOSABS  --  0.0  BASOSABS  --  0.1   ------------------------------------------------------------------------------------------------------------------  Chemistries  Recent Labs  Lab 08/26/20 0814 08/27/20 1200  NA 138 134*  K 4.0 4.3  CL 105 99  CO2 26 22  GLUCOSE 97 139*  BUN 17 26*  CREATININE 0.81 1.08*  CALCIUM 9.1 9.5  AST 21  --   ALT 18  --   ALKPHOS 77  --   BILITOT 0.7  --    ------------------------------------------------------------------------------------------------------------------ estimated creatinine clearance is 30.5 mL/min (A) (by C-G formula based on SCr of 1.08 mg/dL (H)). ------------------------------------------------------------------------------------------------------------------ No results for input(s): TSH, T4TOTAL, T3FREE, THYROIDAB in the last 72 hours.  Invalid input(s): FREET3  Coagulation profile No results for input(s): INR, PROTIME in the last 168 hours. ------------------------------------------------------------------------------------------------------------------- No results for input(s): DDIMER in the last 72 hours. -------------------------------------------------------------------------------------------------------------------  Cardiac Enzymes No results for input(s): CKMB, TROPONINI, MYOGLOBIN in the last 168 hours.  Invalid input(s):  CK ------------------------------------------------------------------------------------------------------------------ No results found for: BNP   ---------------------------------------------------------------------------------------------------------------  Urinalysis    Component Value Date/Time   COLORURINE YELLOW 10/15/2017 1143   APPEARANCEUR HAZY (A) 10/15/2017 1143   LABSPEC 1.017 10/15/2017 1143   PHURINE 6.0 10/15/2017 1143   GLUCOSEU NEGATIVE 10/15/2017 1143   [place] 10/15/2017 [place] 10/15/2017 1143   KETONESUR NEGATIVE 10/15/2017 1143   PROTEINUR NEGATIVE 10/15/2017 1143   NITRITE NEGATIVE 10/15/2017 1143   LEUKOCYTESUR NEGATIVE 10/15/2017 1143    ----------------------------------------------------------------------------------------------------------------   Imaging Results:    CT Abdomen Pelvis W Contrast  Result Date: 08/27/2020 CLINICAL DATA:  Acute left-sided abdominal pain. EXAM: CT ABDOMEN AND PELVIS WITH CONTRAST TECHNIQUE: Multidetector CT imaging of the abdomen and pelvis was performed using the standard protocol following bolus administration of intravenous contrast. CONTRAST:  62mL OMNIPAQUE IOHEXOL 300 MG/ML  SOLN COMPARISON:  August 26, 2020. FINDINGS: Lower chest: No acute abnormality. Hepatobiliary: No focal liver abnormality is seen. No gallstones, gallbladder wall thickening, or biliary dilatation. Pancreas: Unremarkable. No pancreatic ductal dilatation or surrounding inflammatory changes. Spleen: Normal in size without focal abnormality. Adrenals/Urinary Tract: Adrenal glands are unremarkable. Kidneys are normal, without renal calculi, focal lesion, or hydronephrosis. Bladder is unremarkable. Stomach/Bowel: The stomach appears normal. There is significantly increased small bowel dilatation with transition zone seen in the left lower quadrant, best noted on image number 36 of series 5. This is concerning for adhesion. Stool is  noted throughout the colon. No colonic dilatation is noted. The appendix is not visualized. Vascular/Lymphatic: Aortic atherosclerosis. No enlarged abdominal or pelvic lymph nodes. Reproductive: Status post hysterectomy. No adnexal masses. Other: Mild amount of free fluid is noted in the dependent portion the pelvis which is increased compared to prior exam. No definite hernia is noted. Musculoskeletal: No acute or significant osseous findings. IMPRESSION: 1. There is significantly increased small bowel dilatation consistent with obstruction with transition zone seen in the left lower quadrant, concerning for adhesion. Mild amount of free fluid is noted in the dependent portion of the pelvis which is increased compared to prior exam. 2. Aortic atherosclerosis. Aortic Atherosclerosis (ICD10-I70.0). Electronically Signed   By: Marijo Conception M.D.   On: 08/27/2020 14:26   CT Abdomen Pelvis W Contrast  Result Date: 08/26/2020  CLINICAL DATA:  Lower abdominal pain. EXAM: CT ABDOMEN AND PELVIS WITH CONTRAST TECHNIQUE: Multidetector CT imaging of the abdomen and pelvis was performed using the standard protocol following bolus administration of intravenous contrast. CONTRAST:  39mL OMNIPAQUE IOHEXOL 300 MG/ML  SOLN COMPARISON:  None. FINDINGS: Lower chest: Mild bibasilar atelectasis. No consolidation or pleural effusions. Hepatobiliary: No focal liver abnormality is seen. No gallstones, gallbladder wall thickening, or biliary dilatation. Pancreas: Unremarkable. No pancreatic ductal dilatation or surrounding inflammatory changes. Spleen: Normal in size without focal abnormality. Adrenals/Urinary Tract: Adrenal glands are unremarkable. Kidneys are normal, without renal calculi, focal lesion, or hydronephrosis. Bladder is unremarkable. Stomach/Bowel: The duodenum does not cross midline with jejunum in the right abdomen, compatible with mild rotation. No evidence of bowel obstruction. Mild wall thickening and mucosal  hyperenhancement of small bowel loops in the left lower quadrant with a small amount of surrounding fluid, concerning for enteritis. Vascular/Lymphatic: No significant vascular findings are present. No enlarged abdominal or pelvic lymph nodes. Aorta bi-iliac atherosclerosis. Reproductive: Status post hysterectomy. Other: Small volume of free fluid in the anatomic pelvis. No evidence of free air. Musculoskeletal: Similar remote T11 compression fracture without progressive height loss. Osteopenia. IMPRESSION: 1. Mild inflammatory change of small bowel loops in the left lower quadrant with a small amount of surrounding fluid, concerning for enteritis that may be infectious, inflammatory, or ischemic. 2. No evidence of obstruction. 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 Electronically Signed   By: Margaretha Sheffield MD   On: 08/26/2020 09:55      Assessment & Plan:    Active Problems:   SBO (small bowel obstruction) (HCC)   Hypothyroidism   Elevated blood pressure reading without diagnosis of hypertension  Small bowel obstructions -Acute, she presents with nausea, vomiting and abdominal pain, imaging reveals small bowel obstruction, she had history of the same in 2017 resolved without surgery, general surgery consulted by ED, will defer NG tube up to general surgery. -Mid to MedSurg floor. -Keep n.p.o., keep on IV fluids. -We will start on morphine for pain and Zofran for nausea. -Try to ambulate early, monitor electrolyte closely and replete to keep with normal range.  Leukocytosis -Stress related, recheck in a.m., she is nontoxic-appearing  Hypothyroidism -Cannot read any p.o., can convert to IV in 72 hours if she can take oral meds  AKI -Volume depletion, continue with IV fluid, avoid nephrotoxic medications  Elevated blood pressure -She has no history of hypertension, blood pressure elevated to pain, will start on as needed hydralazine as needed     DVT  Prophylaxis  Lovenox   AM Labs Ordered, also please review Full Orders  Family Communication: Admission, patients condition and plan of care including tests being ordered have been discussed with the patient and daughter at bedside* who indicate understanding and agree with the plan and Code Status.  Code Status Full  Likely DC to  Home  Condition GUARDED    Consults called: surgery by ED    Admission status: inpatient    Time spent in minutes : 60 minutes   Phillips Climes M.D on 08/27/2020 at 3:45 PM   [facility] - Office  [phone]

## 2020-08-27 NOTE — ED Notes (Signed)
Patient NG Tube needs to be connected back to suction @ 1905 hrs

## 2020-08-27 NOTE — ED Triage Notes (Signed)
Patient c/o left lower abd pain with nausea and vomiting. Per family patient had left abd pain only yesterday and was seen here in ED in which nothing found to be causing pain. Patient given prescription for tylenol with codeine. Patient now has nausea and vomiting with no improvement in pain. Per patient last BM x2 days ago- normal, no blood noted.

## 2020-08-27 NOTE — ED Notes (Signed)
NG tube hooked back up to intermittent suction

## 2020-08-28 ENCOUNTER — Inpatient Hospital Stay (HOSPITAL_COMMUNITY): Payer: Medicare Other | Admitting: Certified Registered"

## 2020-08-28 ENCOUNTER — Inpatient Hospital Stay (HOSPITAL_COMMUNITY): Payer: Medicare Other

## 2020-08-28 ENCOUNTER — Encounter (HOSPITAL_COMMUNITY)
Admission: EM | Disposition: A | Discharge status: Home Health | Payer: Self-pay | Source: Home / Self Care | Attending: Internal Medicine

## 2020-08-28 ENCOUNTER — Encounter (HOSPITAL_COMMUNITY): Payer: Self-pay | Admitting: Internal Medicine

## 2020-08-28 DIAGNOSIS — K565 Intestinal adhesions [bands], unspecified as to partial versus complete obstruction: Secondary | ICD-10-CM | POA: Diagnosis not present

## 2020-08-28 HISTORY — PX: LAPAROTOMY: SHX154

## 2020-08-28 HISTORY — PX: LYSIS OF ADHESION: SHX5961

## 2020-08-28 LAB — CBC WITH DIFFERENTIAL/PLATELET
Abs Immature Granulocytes: 0.04 10*3/uL (ref 0.00–0.07)
Basophils Absolute: 0 10*3/uL (ref 0.0–0.1)
Basophils Relative: 0 %
Eosinophils Absolute: 0 10*3/uL (ref 0.0–0.5)
Eosinophils Relative: 0 %
HCT: 46.6 % — ABNORMAL HIGH (ref 36.0–46.0)
Hemoglobin: 14.6 g/dL (ref 12.0–15.0)
Immature Granulocytes: 0 %
Lymphocytes Relative: 7 %
Lymphs Abs: 0.8 10*3/uL (ref 0.7–4.0)
MCH: 31.3 pg (ref 26.0–34.0)
MCHC: 31.3 g/dL (ref 30.0–36.0)
MCV: 100 fL (ref 80.0–100.0)
Monocytes Absolute: 0.8 10*3/uL (ref 0.1–1.0)
Monocytes Relative: 7 %
Neutro Abs: 9.8 10*3/uL — ABNORMAL HIGH (ref 1.7–7.7)
Neutrophils Relative %: 86 %
Platelets: 338 10*3/uL (ref 150–400)
RBC: 4.66 MIL/uL (ref 3.87–5.11)
RDW: 13.2 % (ref 11.5–15.5)
WBC: 11.5 10*3/uL — ABNORMAL HIGH (ref 4.0–10.5)
nRBC: 0 % (ref 0.0–0.2)

## 2020-08-28 LAB — BASIC METABOLIC PANEL
Anion gap: 14 (ref 5–15)
BUN: 27 mg/dL — ABNORMAL HIGH (ref 8–23)
CO2: 22 mmol/L (ref 22–32)
Calcium: 9 mg/dL (ref 8.9–10.3)
Chloride: 103 mmol/L (ref 98–111)
Creatinine, Ser: 0.94 mg/dL (ref 0.44–1.00)
GFR, Estimated: >60 mL/min (ref >60)
Glucose, Bld: 116 mg/dL — ABNORMAL HIGH (ref 70–99)
Potassium: 3.8 mmol/L (ref 3.5–5.1)
Sodium: 139 mmol/L (ref 135–145)

## 2020-08-28 LAB — GLUCOSE, CAPILLARY: Glucose-Capillary: 126 mg/dL — ABNORMAL HIGH (ref 70–99)

## 2020-08-28 LAB — TYPE AND SCREEN
ABO/RH(D): O POS
Antibody Screen: NEGATIVE

## 2020-08-28 SURGERY — LAPAROTOMY, EXPLORATORY
Anesthesia: General | Site: Abdomen

## 2020-08-28 MED ORDER — BUPIVACAINE LIPOSOME 1.3 % IJ SUSP
INTRAMUSCULAR | Status: DC | PRN
  Administered 2020-08-28: 20 mL

## 2020-08-28 MED ORDER — SUCCINYLCHOLINE CHLORIDE 200 MG/10ML IV SOSY
PREFILLED_SYRINGE | INTRAVENOUS | Status: AC
  Filled 2020-08-28: qty 10

## 2020-08-28 MED ORDER — LIDOCAINE HCL (CARDIAC) PF 100 MG/5ML IV SOSY
PREFILLED_SYRINGE | INTRAVENOUS | Status: DC | PRN
  Administered 2020-08-28: 60 mg via INTRATRACHEAL

## 2020-08-28 MED ORDER — SODIUM CHLORIDE 0.9 % IV SOLN
2.0000 g | INTRAVENOUS | Status: AC
  Administered 2020-08-28: 2 g via INTRAVENOUS
  Filled 2020-08-28: qty 2

## 2020-08-28 MED ORDER — FENTANYL CITRATE (PF) 100 MCG/2ML IJ SOLN
INTRAMUSCULAR | Status: DC | PRN
  Administered 2020-08-28 (×3): 50 ug via INTRAVENOUS

## 2020-08-28 MED ORDER — CHLORHEXIDINE GLUCONATE CLOTH 2 % EX PADS: 6.0000 | MEDICATED_PAD | Freq: Once | CUTANEOUS | Status: DC

## 2020-08-28 MED ORDER — 0.9 % SODIUM CHLORIDE (POUR BTL) OPTIME
TOPICAL | Status: DC | PRN
  Administered 2020-08-28 (×2): 1000 mL

## 2020-08-28 MED ORDER — SUCCINYLCHOLINE CHLORIDE 200 MG/10ML IV SOSY
PREFILLED_SYRINGE | INTRAVENOUS | Status: DC | PRN
  Administered 2020-08-28: 100 mg via INTRAVENOUS

## 2020-08-28 MED ORDER — SUGAMMADEX SODIUM 200 MG/2ML IV SOLN
INTRAVENOUS | Status: DC | PRN
  Administered 2020-08-28: 150 mg via INTRAVENOUS

## 2020-08-28 MED ORDER — ROCURONIUM BROMIDE 10 MG/ML (PF) SYRINGE
PREFILLED_SYRINGE | INTRAVENOUS | Status: DC | PRN
  Administered 2020-08-28: 20 mg via INTRAVENOUS

## 2020-08-28 MED ORDER — FENTANYL CITRATE (PF) 250 MCG/5ML IJ SOLN
INTRAMUSCULAR | Status: AC
  Filled 2020-08-28: qty 5

## 2020-08-28 MED ORDER — DEXAMETHASONE SODIUM PHOSPHATE 10 MG/ML IJ SOLN
INTRAMUSCULAR | Status: AC
  Filled 2020-08-28: qty 1

## 2020-08-28 MED ORDER — HYDRALAZINE HCL 20 MG/ML IJ SOLN
5.0000 mg | INTRAMUSCULAR | Status: DC | PRN
  Administered 2020-08-30: 5 mg via INTRAVENOUS
  Filled 2020-08-28: qty 1

## 2020-08-28 MED ORDER — ONDANSETRON HCL 4 MG/2ML IJ SOLN
INTRAMUSCULAR | Status: AC
  Filled 2020-08-28: qty 4

## 2020-08-28 MED ORDER — ONDANSETRON HCL 4 MG/2ML IJ SOLN
INTRAMUSCULAR | Status: DC | PRN
  Administered 2020-08-28: 4 mg via INTRAVENOUS

## 2020-08-28 MED ORDER — LIDOCAINE 2% (20 MG/ML) 5 ML SYRINGE
INTRAMUSCULAR | Status: AC
  Filled 2020-08-28: qty 5

## 2020-08-28 MED ORDER — DEXAMETHASONE SODIUM PHOSPHATE 10 MG/ML IJ SOLN
INTRAMUSCULAR | Status: DC | PRN
  Administered 2020-08-28: 5 mg via INTRAVENOUS

## 2020-08-28 MED ORDER — ROCURONIUM BROMIDE 10 MG/ML (PF) SYRINGE
PREFILLED_SYRINGE | INTRAVENOUS | Status: AC
  Filled 2020-08-28: qty 10

## 2020-08-28 MED ORDER — BUPIVACAINE LIPOSOME 1.3 % IJ SUSP
INTRAMUSCULAR | Status: AC
  Filled 2020-08-28: qty 20

## 2020-08-28 MED ORDER — PROPOFOL 10 MG/ML IV BOLUS
INTRAVENOUS | Status: DC | PRN
  Administered 2020-08-28: 80 mg via INTRAVENOUS

## 2020-08-28 MED ORDER — FAMOTIDINE IN NACL 20-0.9 MG/50ML-% IV SOLN
20.0000 mg | INTRAVENOUS | Status: DC
  Administered 2020-08-28 – 2020-08-31 (×4): 20 mg via INTRAVENOUS
  Filled 2020-08-28 (×4): qty 50

## 2020-08-28 MED ORDER — LACTATED RINGERS IV SOLN: INTRAVENOUS | Status: DC | PRN

## 2020-08-28 MED ORDER — PROCHLORPERAZINE EDISYLATE 10 MG/2ML IJ SOLN
5.0000 mg | Freq: Once | INTRAMUSCULAR | Status: AC
  Administered 2020-08-28: 5 mg via INTRAVENOUS
  Filled 2020-08-28: qty 2

## 2020-08-28 MED ORDER — DEXTROSE-NACL 5-0.45 % IV SOLN: INTRAVENOUS | Status: DC

## 2020-08-28 SURGICAL SUPPLY — 68 items
APPLIER CLIP 11 MED OPEN (CLIP)
APPLIER CLIP 13 LRG OPEN (CLIP)
BARRIER SKIN 2 3/4 (OSTOMY) IMPLANT
BARRIER SKIN 2 3/4 INCH (OSTOMY)
BARRIER SKIN OD2.25 2 3/4 FLNG (OSTOMY) IMPLANT
CELLS DAT CNTRL 66122 CELL SVR (MISCELLANEOUS) ×1 IMPLANT
CHLORAPREP W/TINT 26 (MISCELLANEOUS) ×3 IMPLANT
CLAMP POUCH DRAINAGE QUIET (OSTOMY) IMPLANT
CLIP APPLIE 11 MED OPEN (CLIP) IMPLANT
CLIP APPLIE 13 LRG OPEN (CLIP) IMPLANT
CLOTH BEACON ORANGE TIMEOUT ST (SAFETY) ×3 IMPLANT
COVER LIGHT HANDLE STERIS (MISCELLANEOUS) ×6 IMPLANT
COVER WAND RF STERILE (DRAPES) ×3 IMPLANT
DRAPE WARM FLUID 44X44 (DRAPES) ×3 IMPLANT
DRSG OPSITE POSTOP 4X10 (GAUZE/BANDAGES/DRESSINGS) IMPLANT
DRSG OPSITE POSTOP 4X8 (GAUZE/BANDAGES/DRESSINGS) ×2 IMPLANT
ELECT BLADE 6 FLAT ULTRCLN (ELECTRODE) IMPLANT
ELECT REM PT RETURN 9FT ADLT (ELECTROSURGICAL) ×3
ELECTRODE REM PT RTRN 9FT ADLT (ELECTROSURGICAL) ×1 IMPLANT
GLOVE BIO SURGEON STRL SZ 6.5 (GLOVE) ×2 IMPLANT
GLOVE BIO SURGEONS STRL SZ 6.5 (GLOVE) ×1
GLOVE BIOGEL PI IND STRL 6.5 (GLOVE) ×1 IMPLANT
GLOVE BIOGEL PI IND STRL 7.0 (GLOVE) ×2 IMPLANT
GLOVE BIOGEL PI INDICATOR 6.5 (GLOVE) ×2
GLOVE BIOGEL PI INDICATOR 7.0 (GLOVE) ×4
GLOVE SURG SS PI 7.5 STRL IVOR (GLOVE) ×3 IMPLANT
GOWN STRL REUS W/TWL LRG LVL3 (GOWN DISPOSABLE) ×9 IMPLANT
HANDLE SUCTION POOLE (INSTRUMENTS) ×1 IMPLANT
INST SET MAJOR GENERAL (KITS) ×3 IMPLANT
KIT REMOVER STAPLE SKIN (MISCELLANEOUS) IMPLANT
KIT TURNOVER KIT A (KITS) ×3 IMPLANT
LIGASURE IMPACT 36 18CM CVD LR (INSTRUMENTS) IMPLANT
MANIFOLD NEPTUNE II (INSTRUMENTS) ×3 IMPLANT
NDL HYPO 18GX1.5 BLUNT FILL (NEEDLE) ×1 IMPLANT
NDL HYPO 21X1.5 SAFETY (NEEDLE) ×1 IMPLANT
NEEDLE HYPO 18GX1.5 BLUNT FILL (NEEDLE) ×3 IMPLANT
NEEDLE HYPO 21X1.5 SAFETY (NEEDLE) ×3 IMPLANT
NS IRRIG 1000ML POUR BTL (IV SOLUTION) ×6 IMPLANT
PACK ABDOMINAL MAJOR (CUSTOM PROCEDURE TRAY) ×2 IMPLANT
PAD ARMBOARD 7.5X6 YLW CONV (MISCELLANEOUS) ×3 IMPLANT
PENCIL SMOKE EVACUATOR COATED (MISCELLANEOUS) ×3 IMPLANT
POUCH OSTOMY 2 3/4  H 3804 (WOUND CARE)
POUCH OSTOMY 2 PC DRNBL 2.75 (WOUND CARE) IMPLANT
RELOAD LINEAR CUT PROX 55 BLUE (ENDOMECHANICALS) IMPLANT
RELOAD PROXIMATE 75MM BLUE (ENDOMECHANICALS) IMPLANT
RELOAD STAPLE 55 3.8 BLU REG (ENDOMECHANICALS) IMPLANT
RELOAD STAPLE 75 3.8 BLU REG (ENDOMECHANICALS) IMPLANT
RETRACTOR WND ALEXIS 18 MED (MISCELLANEOUS) IMPLANT
RETRACTOR WND ALEXIS 25 LRG (MISCELLANEOUS) IMPLANT
RETRACTOR WOUND ALXS 18CM MED (MISCELLANEOUS) IMPLANT
RTRCTR WOUND ALEXIS 18CM MED (MISCELLANEOUS) ×3
RTRCTR WOUND ALEXIS 25CM LRG (MISCELLANEOUS)
RTRCTR WOUND ALEXIS O 18CM MED (MISCELLANEOUS) ×3
SET BASIN LINEN APH (SET/KITS/TRAYS/PACK) ×3 IMPLANT
SPONGE LAP 18X18 RF (DISPOSABLE) ×3 IMPLANT
STAPLER GUN LINEAR PROX 60 (STAPLE) IMPLANT
STAPLER PROXIMATE 55 BLUE (STAPLE) IMPLANT
STAPLER PROXIMATE 75MM BLUE (STAPLE) IMPLANT
STAPLER VISISTAT (STAPLE) ×3 IMPLANT
SUCTION POOLE HANDLE (INSTRUMENTS) ×3
SUT CHROMIC 0 SH (SUTURE) IMPLANT
SUT CHROMIC 2 0 SH (SUTURE) IMPLANT
SUT CHROMIC 3 0 SH 27 (SUTURE) IMPLANT
SUT PDS AB CT VIOLET #0 27IN (SUTURE) ×6 IMPLANT
SUT PROLENE 2 0 SH 30 (SUTURE) IMPLANT
SUT SILK 3 0 SH CR/8 (SUTURE) ×3 IMPLANT
SYR 20ML LL LF (SYRINGE) ×6 IMPLANT
TRAY FOLEY MTR SLVR 16FR STAT (SET/KITS/TRAYS/PACK) ×3 IMPLANT

## 2020-08-28 NOTE — Progress Notes (Signed)
[facility] night shift.  The staff reports that the patient is complaining of nausea.  She received a Zofran tablet 0300 and is requesting something else.  Compazine 5 mg IVP x1 dose ordered.  Tennis Must, MD

## 2020-08-28 NOTE — Progress Notes (Signed)
Pt off the unit via bed with OR staff.

## 2020-08-28 NOTE — Evaluation (Signed)
Physical Therapy Evaluation Patient Details Name: JAMEE KEACH MRN: [id number] DOB: 11-23-1935 Today's Date: 08/28/2020   History of Present Illness  Sabriel Borromeo  is a 84 y.o. female, with  history of hypothyroidism, history of previous small bowel obstruction in 2017 resolved without surgical intervention, patient presents to ED secondary to complaints of abdominal pain, she reports nausea, vomiting over last 24 hours, abdominal pain over last 48 hours, reports few episodes of vomiting, bilious, reports abdominal pain mainly in the left lower quadrant, she denies fever, chills, chest pain, shortness of breath, or diarrhea, last bowel movement before yesterday morning, she reports the symptoms samples previous episode of SBO she had in the past, she had surgical history including open abdominal hysterectomy back in 1978, she came to ED yesterday, her CT scan of abdomen was read as just some inflammation in the small bowels, with no evidence of obstruction, and reports she is vaccinated against Covid with recent booster on 10/22.- in ED patient with significant pain requiring IV Dilaudid, blood pressure is elevated but did improve with pain medications, she had elevated creatinine at 1.08 from baseline of 0.8, she had leukocytosis at 17 K, CT abdomen with contrast showing small bowel dilation consistent with obstruction with transition zone seen in the left lower quadrant concerning for adhesions, she received IV fluids, pain medication, nausea medication, and [facility] hospitalist consulted to admit.    Clinical Impression  Patient presents with NGT and c/o nausea, but able to participate.  Patient demonstrates slightly labored movement for functional mobility and ambulation in room, hallway, good return for transferring to commode in bathroom and tolerated sitting up in chair after therapy - RN notified concerning patient's c/o nausea.  Patient will benefit from continued physical therapy in hospital  and recommended venue below to increase strength, balance, endurance for safe ADLs and gait.    Follow Up Recommendations Home health PT;Supervision - Intermittent    Equipment Recommendations  None recommended by PT    Recommendations for Other Services       Precautions / Restrictions Precautions Precautions: None Restrictions Weight Bearing Restrictions: No      Mobility  Bed Mobility Overal bed mobility: Needs Assistance Bed Mobility: Supine to Sit     Supine to sit: Supervision;HOB elevated     General bed mobility comments: slightly labored movement, increased time    Transfers Overall transfer level: Needs assistance Equipment used: None Transfers: Sit to/from Stand;Stand Pivot Transfers Sit to Stand: Supervision Stand pivot transfers: Supervision       General transfer comment: increased time, labored movement  Ambulation/Gait Ambulation/Gait assistance: Supervision Gait Distance (Feet): 50 Feet Assistive device: None Gait Pattern/deviations: WFL(Within Functional Limits) Gait velocity: decreased   General Gait Details: grossly WFL, demonstrates good return for ambulation in room and hallway without loss of balance, limited secondary to c/o fatigue and nausea  Stairs            Wheelchair Mobility    Modified Rankin (Stroke Patients Only)       Balance Overall balance assessment: No apparent balance deficits (not formally assessed)                                           Pertinent Vitals/Pain Pain Assessment: 0-10 Pain Score: 6  Pain Location: abdomen with pressure Pain Descriptors / Indicators: Sore;Aching Pain Intervention(s): Limited activity within patient's tolerance;Monitored during session  Home Living Family/patient expects to be discharged to:: Private residence Living Arrangements: Alone Available Help at Discharge: Family;Available PRN/intermittently Type of Home: House Home Access: Stairs to  enter Entrance Stairs-Rails: Right;Left (to wide to reach both) Entrance Stairs-Number of Steps: 2 Home Layout: Two level Home Equipment: None      Prior Function Level of Independence: Independent         Comments: Hydrographic surveyor, drives     Journalist, newspaper        Extremity/Trunk Assessment   Upper Extremity Assessment Upper Extremity Assessment: Overall WFL for tasks assessed    Lower Extremity Assessment Lower Extremity Assessment: Generalized weakness    Cervical / Trunk Assessment Cervical / Trunk Assessment: Normal  Communication   Communication: No difficulties  Cognition Arousal/Alertness: Awake/alert Behavior During Therapy: WFL for tasks assessed/performed Overall Cognitive Status: Within Functional Limits for tasks assessed                                        General Comments      Exercises     Assessment/Plan    PT Assessment Patient needs continued PT services  PT Problem List Decreased strength;Decreased activity tolerance;Decreased balance;Decreased mobility       PT Treatment Interventions Balance training;Gait training;Stair training;Functional mobility training;Therapeutic activities;Therapeutic exercise;Patient/family education    PT Goals (Current goals can be found in the Care Plan section)  Acute Rehab PT Goals Patient Stated Goal: return home with family to assist PT Goal Formulation: With patient Time For Goal Achievement: 09/04/20 Potential to Achieve Goals: Good    Frequency Min 3X/week   Barriers to discharge        Co-evaluation               AM-PAC PT "6 Clicks" Mobility  Outcome Measure Help needed turning from your back to your side while in a flat bed without using bedrails?: None Help needed moving from lying on your back to sitting on the side of a flat bed without using bedrails?: A Little Help needed moving to and from a bed to a chair (including a wheelchair)?: None Help needed  standing up from a chair using your arms (e.g., wheelchair or bedside chair)?: None Help needed to walk in hospital room?: A Little Help needed climbing 3-5 steps with a railing? : A Little 6 Click Score: 21    End of Session   Activity Tolerance: Patient tolerated treatment well;Patient limited by fatigue Patient left: in chair;with call bell/phone within reach Nurse Communication: Mobility status PT Visit Diagnosis: Unsteadiness on feet (R26.81);Other abnormalities of gait and mobility (R26.89);Muscle weakness (generalized) (M62.81)    Time: 9169-4503 PT Time Calculation (min) (ACUTE ONLY): 27 min   Charges:   PT Evaluation $PT Eval Moderate Complexity: 1 Mod PT Treatments $Therapeutic Activity: 23-37 mins        10:55 AM, 08/28/20 Lonell Grandchild, MPT Physical Therapist with [facility] [phone] office [phone] mobile phone

## 2020-08-28 NOTE — Anesthesia Postprocedure Evaluation (Signed)
Anesthesia Post Note  Patient: Megan Conley  Procedure(s) Performed: EXPLORATORY LAPAROTOMY (N/A Abdomen) LYSIS OF ADHESIONS (N/A Abdomen)  Patient location during evaluation: PACU Anesthesia Type: General Level of consciousness: patient cooperative and awake Pain management: pain level controlled Vital Signs Assessment: post-procedure vital signs reviewed and stable Respiratory status: spontaneous breathing and patient connected to nasal cannula oxygen Cardiovascular status: stable Postop Assessment: no apparent nausea or vomiting Anesthetic complications: no Comments: 2 L nasal cannula;appears comfortable SaO2 75%   No complications documented.   Last Vitals:  Vitals:   08/28/20 1345 08/28/20 1400  BP: (!) 150/80 (!) 164/84  Pulse: 100 95  Resp: 12 13  Temp:    SpO2: 98% 97%    Last Pain:  Vitals:   08/28/20 1327  TempSrc:   PainSc: Mayra Neer

## 2020-08-28 NOTE — Care Management Important Message (Signed)
Important Message  Patient Details  Name: Megan Conley MRN: [id number] Date of Birth: 05/08/1936   Medicare Important Message Given:  Yes - Important Message mailed due to current National Emergency     Tommy Medal 08/28/2020, 6:06 PM

## 2020-08-28 NOTE — Op Note (Signed)
[facility] Operative Note  08/28/20  Preoperative Diagnosis: Small bowel obstruction    Postoperative Diagnosis: Same   Procedure(s) Performed: Exploratory laparotomy, lysis of adhesions X 2, small superficial cautery burn on small bowel, oversewn with lembert silk suture    Surgeon: Lanell Matar. Constance Haw, MD   Assistants: No qualified resident was available    Anesthesia: General endotracheal   Anesthesiologist: Benjamine Mola, MD    Specimens: None    Estimated Blood Loss: Minimal   Blood Replacement: None    Complications: None   Wound Class: Clean    Operative Indications: Ms. Megan Conley is a 84 yo with a SBO that did not resolve after SBFT. She had a prior hysterectomy. We discussed exploration and possible risk of bleeding, infection, bowel resection or injury to other organs. She opted to proceed.   Findings: Two tight omental bands around two portions of bowel    Procedure: The patient was taken to the operating room and placed supine. General endotracheal anesthesia was induced. Intravenous antibiotics were  administered per protocol.  An orogastric tube positioned to decompress the stomach. The abdomen was prepared and draped in the usual sterile fashion.   A midline incision was made and carried down through to the fascia with electrocautery. Care was take on entering the peritoneum with scissors. The incision was extended. A wound protector was placed. Dilated bowel was eviscerated and ran proximal and distal. Two tight omental adhesions across the small bowel were noted and lysed. In lysing one the cautery did have a very superficial burn on a decompressed loop of bowel that moved into the field inadvertently. This was inspected and was very superficial and less than 1 mm in size but given this I oversewed it with Lembert 3-0 silk sutures to ensure no further extension of the injury.   The bowel was ran twice and was healthy and pink. The NG was  confirmed in the stomach. The small bowel was placed back into the abdomen in anatomic position.   Final inspection revealed acceptable hemostasis. The fascia was closed in the standard fashion with 0 PDS suture. The skin was closed with staples. A honeycomb dressing was placed. All counts were correct at the end of the case. The patient was awakened from anesthesia and extubated without complication.  The patient went to the PACU in stable condition.   Curlene Labrum, MD [facility] [address] [place], [place] [address] [phone] (office)

## 2020-08-28 NOTE — Anesthesia Procedure Notes (Signed)
Procedure Name: Intubation Date/Time: 08/28/2020 12:32 PM Performed by: Karna Dupes, CRNA Pre-anesthesia Checklist: Patient identified, Emergency Drugs available, Suction available and Patient being monitored Patient Re-evaluated:Patient Re-evaluated prior to induction Oxygen Delivery Method: Circle system utilized Preoxygenation: Pre-oxygenation with 100% oxygen Induction Type: IV induction, Cricoid Pressure applied and Rapid sequence Laryngoscope Size: Mac and 3 Grade View: Grade I Tube type: Oral Tube size: 7.0 mm Number of attempts: 1 Airway Equipment and Method: Stylet Placement Confirmation: ETT inserted through vocal cords under direct vision,  positive ETCO2 and breath sounds checked- equal and bilateral Tube secured with: Tape Dental Injury: Teeth and Oropharynx as per pre-operative assessment

## 2020-08-28 NOTE — Transfer of Care (Signed)
Immediate Anesthesia Transfer of Care Note  Patient: Megan Conley  Procedure(s) Performed: EXPLORATORY LAPAROTOMY (N/A Abdomen) LYSIS OF ADHESIONS (N/A Abdomen)  Patient Location: PACU  Anesthesia Type:General  Level of Consciousness: drowsy  Airway & Oxygen Therapy: Patient Spontanous Breathing and Patient connected to nasal cannula oxygen  Post-op Assessment: Report given to RN and Post -op Vital signs reviewed and stable  Post vital signs: Reviewed and stable  Last Vitals:  Vitals Value Taken Time  BP 146/73 08/28/20 1326  Temp    Pulse 102 08/28/20 1328  Resp 12 08/28/20 1328  SpO2 99 % 08/28/20 1328  Vitals shown include unvalidated device data.  Last Pain:  Vitals:   08/28/20 1122  TempSrc: Oral  PainSc:          Complications: No complications documented.

## 2020-08-28 NOTE — Interval H&P Note (Signed)
History and Physical Interval Note:  08/28/2020 10:52 AM  Megan Conley  has presented today for surgery, with the diagnosis of Small Bowel Obstruction.  The various methods of treatment have been discussed with the patient and family. After consideration of risks, benefits and other options for treatment, the patient has consented to  Procedure(s): EXPLORATORY LAPAROTOMY, lysis of adhesions, possible small bowel resection (N/A) as a surgical intervention.  The patient's history has been reviewed, patient examined, no change in status, stable for surgery.  I have reviewed the patient's chart and labs.  Questions were answered to the patient's satisfaction.    SBFT without contrast into the colon. Remains nauseated. Plan for Ex lap today. Spoke with patient and daughter.   Virl Cagey

## 2020-08-28 NOTE — Anesthesia Preprocedure Evaluation (Signed)
Anesthesia Evaluation  Patient identified by MRN, date of birth, ID band Patient awake    Reviewed: Allergy & Precautions, NPO status , Patient's Chart, lab work & pertinent test results, reviewed documented beta blocker date and time   History of Anesthesia Complications Negative for: history of anesthetic complications  Airway Mallampati: II  TM Distance: >3 FB Neck ROM: Full    Dental no notable dental hx.    Pulmonary neg pulmonary ROS,    Pulmonary exam normal breath sounds clear to auscultation       Cardiovascular negative cardio ROS Normal cardiovascular exam Rhythm:Regular Rate:Normal     Neuro/Psych negative neurological ROS     GI/Hepatic negative GI ROS, Neg liver ROS, Bowel Obstruction   Endo/Other  Hypothyroidism   Renal/GU negative Renal ROS     Musculoskeletal   Abdominal   Peds  Hematology   Anesthesia Other Findings   Reproductive/Obstetrics                             Anesthesia Physical Anesthesia Plan  ASA: II  Anesthesia Plan: General   Post-op Pain Management:    Induction: Intravenous  PONV Risk Score and Plan:   Airway Management Planned: Oral ETT  Additional Equipment:   Intra-op Plan:   Post-operative Plan: Extubation in OR  Informed Consent: I have reviewed the patients History and Physical, chart, labs and discussed the procedure including the risks, benefits and alternatives for the proposed anesthesia with the patient or authorized representative who has indicated his/her understanding and acceptance.     Dental advisory given  Plan Discussed with: CRNA  Anesthesia Plan Comments:         Anesthesia Quick Evaluation

## 2020-08-28 NOTE — Plan of Care (Signed)
  Problem: Acute Rehab PT Goals(only PT should resolve) Goal: Pt Will Go Supine/Side To Sit Outcome: Progressing Flowsheets (Taken 08/28/2020 1057) Pt will go Supine/Side to Sit:  Independently  with modified independence Goal: Patient Will Transfer Sit To/From Stand Outcome: Progressing Flowsheets (Taken 08/28/2020 1057) Patient will transfer sit to/from stand:  with modified independence  Independently Goal: Pt Will Transfer Bed To Chair/Chair To Bed Outcome: Progressing Flowsheets (Taken 08/28/2020 1057) Pt will Transfer Bed to Chair/Chair to Bed:  with modified independence  Independently Goal: Pt Will Ambulate Outcome: Progressing Flowsheets (Taken 08/28/2020 1057) Pt will Ambulate:  > 125 feet  with modified independence   10:57 AM, 08/28/20 Lonell Grandchild, MPT Physical Therapist with [facility] [phone] office [phone] mobile phone

## 2020-08-28 NOTE — Progress Notes (Signed)
Patient Demographics:    Megan Conley, is a 84 y.o. female, DOB - [birth year], WUJ:[id number]  Admit date - 08/27/2020   Admitting Physician Megan Patricia, MD  Outpatient Primary MD for the patient is Megan Squibb, MD  LOS - 1   Chief Complaint  Patient presents with  . Abdominal Pain        Subjective:    Megan Conley today has no new complaints, resting comfortably postop  Assessment  & Plan :    Principal Problem:   SBO (small bowel obstruction) (HCC) Active Problems:   Hypothyroidism   Elevated blood pressure reading without diagnosis of hypertension   Brief Summary: -84 y.o. female, with  history of hypothyroidism, history of previous small bowel obstruction in 2017 resolved without surgical intervention, admitted on 08/27/2020 with an episode of SBO with AKI and leukocytosis  A/p 1)SBO-failed conservative measures with NG tube, underwent  lysis of adhesions on 08/28/2020 with Dr. Constance Conley, - Plan for NG until bowel function. Then can adv as tolerated. -Give IV dextrose solution and check CBGs while n.p.o.  2) leukocytosis--most likely reactive from #1 above -WBC is down to 11.5 from 17.0 on admission  3) hypothyroidism--resume when oral intake resumes however if oral intake is delayed another 48 to 72 hours consider IV replacement at 50% of oral dose  4)AKI-due to dehydration SBO improved with hydration -Baseline creatinine usually around 0.6-0.8 -renally adjust medications, avoid nephrotoxic agents / dehydration  / hypotension   Disposition/Need for in-Hospital Stay- patient unable to be discharged at this time due to --SBO status post lysis of adhesions awaiting return of bowel function, currently requiring IV fluids*  Status is: Inpatient  Remains inpatient appropriate because:SBO status post lysis of adhesions awaiting return of bowel function, currently requiring IV  fluids*   Disposition: The patient is from: Home              Anticipated d/c is to: Home              Anticipated d/c date is: 2 days              Patient currently is not medically stable to d/c. Barriers: Not Clinically Stable- -SBO status post lysis of adhesions awaiting return of bowel function, currently requiring IV fluids*   Code Status : Full  Family Communication:    (patient is alert, awake and coherent)    Consults  : General surgery  DVT Prophylaxis  :  Lovenox   SCDs *  Lab Results  Component Value Date   PLT 338 08/28/2020    Inpatient Medications  Scheduled Meds: . brimonidine  1 drop Both Eyes Daily  . enoxaparin (LOVENOX) injection  30 mg Subcutaneous Q24H  . latanoprost  1 drop Both Eyes QHS   Continuous Infusions: . sodium chloride 75 mL/hr at 08/27/20 1343   PRN Meds:.hydrALAZINE, morphine injection, ondansetron **OR** ondansetron (ZOFRAN) IV    Anti-infectives (From admission, onward)   Start     Dose/Rate Route Frequency Ordered Stop   08/28/20 1145  cefoTEtan (CEFOTAN) 2 g in sodium chloride 0.9 % 100 mL IVPB        2 g 200 mL/hr over 30 Minutes Intravenous On call to O.R. 08/28/20 1050  08/28/20 1303        Objective:   Vitals:   08/28/20 1345 08/28/20 1400 08/28/20 1415 08/28/20 1509  BP: (!) 150/80 (!) 164/84 (!) 161/85 (!) 164/81  Pulse: 100 95 95 90  Resp: 12 13 16    Temp:      TempSrc:      SpO2: 98% 97% 98%   Weight:      Height:        Wt Readings from Last 3 Encounters:  08/27/20 53.5 kg  08/26/20 54.4 kg  10/15/17 54.4 kg     Intake/Output Summary (Last 24 hours) at 08/28/2020 1943 Last data filed at 08/28/2020 1700 Gross per 24 hour  Intake 1462.44 ml  Output 950 ml  Net 512.44 ml     Physical Exam  Gen:- Awake Alert, in no acute distress HEENT:- .AT, No sclera icterus Nose- NG tube Neck-Supple Neck,No JVD,.  Lungs-diminished in bases, no wheezing CV- S1, S2 normal, regular  Abd-  +ve B.Sounds, Abd  Soft, No tenderness,    Extremity/Skin:- No  edema, pedal pulses present  Psych-affect is appropriate, oriented x3 Neuro-no new focal deficits, no tremors   Data Review:   Micro Results Recent Results (from the past 240 hour(s))  Respiratory Panel by RT PCR (Flu A&B, Covid) - Nasopharyngeal Swab     Status: None   Collection Time: 08/27/20 12:26 PM   Specimen: Nasopharyngeal Swab  Result Value Ref Range Status   SARS Coronavirus 2 by RT PCR NEGATIVE NEGATIVE Final    Comment: (NOTE) SARS-CoV-2 target nucleic acids are NOT DETECTED.  The SARS-CoV-2 RNA is generally detectable in upper respiratoy specimens during the acute phase of infection. The lowest concentration of SARS-CoV-2 viral copies this assay can detect is 131 copies/mL. A negative result does not preclude SARS-Cov-2 infection and should not be used as the sole basis for treatment or other patient management decisions. A negative result may occur with  improper specimen collection/handling, submission of specimen other than nasopharyngeal swab, presence of viral mutation(s) within the areas targeted by this assay, and inadequate number of viral copies (<131 copies/mL). A negative result must be combined with clinical observations, patient history, and epidemiological information. The expected result is Negative.  Fact Sheet for Patients:  PinkCheek.be  Fact Sheet for Healthcare Providers:  GravelBags.it  This test is no t yet approved or cleared by the [place] FDA and  has been authorized for detection and/or diagnosis of SARS-CoV-2 by FDA under an Emergency Use Authorization (EUA). This EUA will remain  in effect (meaning this test can be used) for the duration of the COVID-19 declaration under Section 564(b)(1) of the Act, 21 U.S.C. section 360bbb-3(b)(1), unless the authorization is terminated or revoked sooner.     Influenza A by PCR NEGATIVE  NEGATIVE Final   Influenza B by PCR NEGATIVE NEGATIVE Final    Comment: (NOTE) The Xpert Xpress SARS-CoV-2/FLU/RSV assay is intended as an aid in  the diagnosis of influenza from Nasopharyngeal swab specimens and  should not be used as a sole basis for treatment. Nasal washings and  aspirates are unacceptable for Xpert Xpress SARS-CoV-2/FLU/RSV  testing.  Fact Sheet for Patients: PinkCheek.be  Fact Sheet for Healthcare Providers: GravelBags.it  This test is not yet approved or cleared by the [place] FDA and  has been authorized for detection and/or diagnosis of SARS-CoV-2 by  FDA under an Emergency Use Authorization (EUA). This EUA will remain  in effect (meaning this test can be used)  for the duration of the  Covid-19 declaration under Section 564(b)(1) of the Act, 21  U.S.C. section 360bbb-3(b)(1), unless the authorization is  terminated or revoked. Performed at [facility], [address] [place], [place] [address]     Radiology Reports CT Abdomen Pelvis W Contrast  Result Date: 08/27/2020 CLINICAL DATA:  Acute left-sided abdominal pain. EXAM: CT ABDOMEN AND PELVIS WITH CONTRAST TECHNIQUE: Multidetector CT imaging of the abdomen and pelvis was performed using the standard protocol following bolus administration of intravenous contrast. CONTRAST:  32mL OMNIPAQUE IOHEXOL 300 MG/ML  SOLN COMPARISON:  August 26, 2020. FINDINGS: Lower chest: No acute abnormality. Hepatobiliary: No focal liver abnormality is seen. No gallstones, gallbladder wall thickening, or biliary dilatation. Pancreas: Unremarkable. No pancreatic ductal dilatation or surrounding inflammatory changes. Spleen: Normal in size without focal abnormality. Adrenals/Urinary Tract: Adrenal glands are unremarkable. Kidneys are normal, without renal calculi, focal lesion, or hydronephrosis. Bladder is unremarkable. Stomach/Bowel: The stomach appears normal. There is  significantly increased small bowel dilatation with transition zone seen in the left lower quadrant, best noted on image number 36 of series 5. This is concerning for adhesion. Stool is noted throughout the colon. No colonic dilatation is noted. The appendix is not visualized. Vascular/Lymphatic: Aortic atherosclerosis. No enlarged abdominal or pelvic lymph nodes. Reproductive: Status post hysterectomy. No adnexal masses. Other: Mild amount of free fluid is noted in the dependent portion the pelvis which is increased compared to prior exam. No definite hernia is noted. Musculoskeletal: No acute or significant osseous findings. IMPRESSION: 1. There is significantly increased small bowel dilatation consistent with obstruction with transition zone seen in the left lower quadrant, concerning for adhesion. Mild amount of free fluid is noted in the dependent portion of the pelvis which is increased compared to prior exam. 2. Aortic atherosclerosis. Aortic Atherosclerosis (ICD10-I70.0). Electronically Signed   By: Marijo Conception M.D.   On: 08/27/2020 14:26   CT Abdomen Pelvis W Contrast  Result Date: 08/26/2020 CLINICAL DATA:  Lower abdominal pain. EXAM: CT ABDOMEN AND PELVIS WITH CONTRAST TECHNIQUE: Multidetector CT imaging of the abdomen and pelvis was performed using the standard protocol following bolus administration of intravenous contrast. CONTRAST:  71mL OMNIPAQUE IOHEXOL 300 MG/ML  SOLN COMPARISON:  None. FINDINGS: Lower chest: Mild bibasilar atelectasis. No consolidation or pleural effusions. Hepatobiliary: No focal liver abnormality is seen. No gallstones, gallbladder wall thickening, or biliary dilatation. Pancreas: Unremarkable. No pancreatic ductal dilatation or surrounding inflammatory changes. Spleen: Normal in size without focal abnormality. Adrenals/Urinary Tract: Adrenal glands are unremarkable. Kidneys are normal, without renal calculi, focal lesion, or hydronephrosis. Bladder is unremarkable.  Stomach/Bowel: The duodenum does not cross midline with jejunum in the right abdomen, compatible with mild rotation. No evidence of bowel obstruction. Mild wall thickening and mucosal hyperenhancement of small bowel loops in the left lower quadrant with a small amount of surrounding fluid, concerning for enteritis. Vascular/Lymphatic: No significant vascular findings are present. No enlarged abdominal or pelvic lymph nodes. Aorta bi-iliac atherosclerosis. Reproductive: Status post hysterectomy. Other: Small volume of free fluid in the anatomic pelvis. No evidence of free air. Musculoskeletal: Similar remote T11 compression fracture without progressive height loss. Osteopenia. IMPRESSION: 1. Mild inflammatory change of small bowel loops in the left lower quadrant with a small amount of surrounding fluid, concerning for enteritis that may be infectious, inflammatory, or ischemic. 2. No evidence of obstruction. 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 Electronically Signed   By: Margaretha Sheffield MD   On: 08/26/2020  09:55   DG Abd Portable 1V  Result Date: 08/28/2020 CLINICAL DATA:  Abdominal pain. EXAM: PORTABLE ABDOMEN - 1 VIEW COMPARISON:  Prior study same day. FINDINGS: NG tube noted coiled stomach. Dilated loops of small bowel again noted. Contrast again noted throughout the small bowel without significant interim movement. No significant amount of contrast in the colon. No free air. IMPRESSION: 1. NG tube noted coiled in the stomach. 2. Dilated loops of small bowel again noted. Contrast again noted throughout the small bowel without significant interim movement. No significant amount of contrast in the colon. Electronically Signed   By: Marcello Moores  Register   On: 08/28/2020 07:43   DG Abd Portable 1V-Small Bowel Obstruction Protocol-initial, 8 hr delay  Result Date: 08/28/2020 CLINICAL DATA:  8 hour delay small-bowel obstruction EXAM: PORTABLE ABDOMEN - 1 VIEW COMPARISON:   08/27/2020 FINDINGS: There is contrast material within multiple loops of small bowel in the pelvis but no definite contrast in the colon. Small bowel remains dilated. IMPRESSION: Contrast material in the distal small bowel but none visualized within the colon. Electronically Signed   By: Ulyses Jarred M.D.   On: 08/28/2020 02:22   DG Abd Portable 1V-Small Bowel Protocol-Position Verification  Result Date: 08/27/2020 CLINICAL DATA:  NG tube placement EXAM: PORTABLE ABDOMEN - 1 VIEW COMPARISON:  None. FINDINGS: NG tube within stomach. Tip is in the gastric antrum. Side port within the stomach. Mildly dilated loops of small bowel measuring up to 2.9 cm. IMPRESSION: NG tube in good position. Electronically Signed   By: Suzy Bouchard M.D.   On: 08/27/2020 17:48     CBC Recent Labs  Lab 08/26/20 0814 08/27/20 1200 08/28/20 0514  WBC 6.9 17.0* 11.5*  HGB 14.0 16.0* 14.6  HCT 43.8 48.4* 46.6*  PLT 318 338 338  MCV 98.0 97.6 100.0  MCH 31.3 32.3 31.3  MCHC 32.0 33.1 31.3  RDW 12.5 13.0 13.2  LYMPHSABS  --  1.1 0.8  MONOABS  --  1.0 0.8  EOSABS  --  0.0 0.0  BASOSABS  --  0.1 0.0    Chemistries  Recent Labs  Lab 08/26/20 0814 08/27/20 1200 08/28/20 0514  NA 138 134* 139  K 4.0 4.3 3.8  CL 105 99 103  CO2 26 22 22   GLUCOSE 97 139* 116*  BUN 17 26* 27*  CREATININE 0.81 1.08* 0.94  CALCIUM 9.1 9.5 9.0  AST 21  --   --   ALT 18  --   --   ALKPHOS 77  --   --   BILITOT 0.7  --   --    ------------------------------------------------------------------------------------------------------------------ No results for input(s): CHOL, HDL, LDLCALC, TRIG, CHOLHDL, LDLDIRECT in the last 72 hours.  Lab Results  Component Value Date   HGBA1C 5.5 09/18/2016   ------------------------------------------------------------------------------------------------------------------ No results for input(s): TSH, T4TOTAL, T3FREE, THYROIDAB in the last 72 hours.  Invalid input(s):  FREET3 ------------------------------------------------------------------------------------------------------------------ No results for input(s): VITAMINB12, FOLATE, FERRITIN, TIBC, IRON, RETICCTPCT in the last 72 hours.  Coagulation profile No results for input(s): INR, PROTIME in the last 168 hours.  No results for input(s): DDIMER in the last 72 hours.  Cardiac Enzymes No results for input(s): CKMB, TROPONINI, MYOGLOBIN in the last 168 hours.  Invalid input(s): CK ------------------------------------------------------------------------------------------------------------------ No results found for: BNP   Roxan Hockey M.D on 08/28/2020 at 7:43 PM  Go to [email or web address] - for contact info  [facility] - Office  [phone]

## 2020-08-28 NOTE — Progress Notes (Addendum)
[facility]  Notified daughter, SBO with lysis of adhesions. Plan for NG until bowel function. Then can adv as tolerated.  Dr. Christian Mate will see over the weekend.   Curlene Labrum, MD [facility] [address] [place], [place] [address] [phone] (office)

## 2020-08-29 LAB — GLUCOSE, CAPILLARY
Glucose-Capillary: 135 mg/dL — ABNORMAL HIGH (ref 70–99)
Glucose-Capillary: 128 mg/dL — ABNORMAL HIGH (ref 70–99)
Glucose-Capillary: 124 mg/dL — ABNORMAL HIGH (ref 70–99)
Glucose-Capillary: 120 mg/dL — ABNORMAL HIGH (ref 70–99)

## 2020-08-29 MED ORDER — CHLORHEXIDINE GLUCONATE CLOTH 2 % EX PADS
6.0000 | MEDICATED_PAD | Freq: Every day | CUTANEOUS | Status: DC
  Administered 2020-08-29: 6 via TOPICAL

## 2020-08-29 NOTE — Progress Notes (Signed)
[place] [facility] Day(s): 2.   Post op day(s): 1 Day Post-Op.   Interval History: Patient seen and examined, no acute events or new complaints overnight. Patient reports wanting to have NG tube removed today.  Denying flatus or bowel movement.  Reports reasonable pain control.  Seems willing to begin mobilization, and cooperation with daughter present in room.  Wants to remove Foley catheter.   Vital signs in last 24 hours: [min-max] current  Temp:  [98.4 F (36.9 C)-98.7 F (37.1 C)] 98.4 F (36.9 C) (10/30 0104) Pulse Rate:  [89-105] 89 (10/30 0104) Resp:  [12-19] 16 (10/30 0104) BP: (146-180)/(75-90) 177/87 (10/30 0104) SpO2:  [96 %-99 %] 96 % (10/30 0104)     Height: 4\' 11"  (149.9 cm) Weight: 53.5 kg BMI (Calculated): 23.81   Intake/Output last 2 shifts:  10/29 0701 - 10/30 0700 In: 1398.6 [I.V.:1268.4; IV Piggyback:130.2] Out: 1250 [Urine:425; Emesis/NG output:600; Blood:25]   Physical Exam:  Constitutional: alert, cooperative and no distress  Respiratory: breathing non-labored at rest  Cardiovascular: regular rate and sinus rhythm  Gastrointestinal: soft, non-tender, and non-distended, abdominal binder in place. Integumentary: Honeycomb dressing without staining.  Skin intact.  Labs:  CBC Latest Ref Rng & Units 08/28/2020 08/27/2020 08/26/2020  WBC 4.0 - 10.5 K/uL 11.5(H) 17.0(H) 6.9  Hemoglobin 12.0 - 15.0 g/dL 14.6 16.0(H) 14.0  Hematocrit 36 - 46 % 46.6(H) 48.4(H) 43.8  Platelets 150 - 400 K/uL 338 338 318   CMP Latest Ref Rng & Units 08/28/2020 08/27/2020 08/26/2020  Glucose 70 - 99 mg/dL 116(H) 139(H) 97  BUN 8 - 23 mg/dL 27(H) 26(H) 17  Creatinine 0.44 - 1.00 mg/dL 0.94 1.08(H) 0.81  Sodium 135 - 145 mmol/L 139 134(L) 138  Potassium 3.5 - 5.1 mmol/L 3.8 4.3 4.0  Chloride 98 - 111 mmol/L 103 99 105  CO2 22 - 32 mmol/L 22 22 26   Calcium 8.9 - 10.3 mg/dL 9.0 9.5 9.1  Total Protein 6.5 - 8.1 g/dL - - 6.8  Total  Bilirubin 0.3 - 1.2 mg/dL - - 0.7  Alkaline Phos 38 - 126 U/L - - 77  AST 15 - 41 U/L - - 21  ALT 0 - 44 U/L - - 18     Imaging studies: No new pertinent imaging studies   Assessment/Plan:  84 y.o. female with history of small bowel obstruction 1 Day Post-Op s/p lysis of adhesions, complicated by pertinent comorbidities including.  Patient Active Problem List   Diagnosis Date Noted  . Elevated blood pressure reading without diagnosis of hypertension 09/17/2016  . Hyperglycemia 09/17/2016  . Small bowel obstruction ([place]) 09/16/2016  . SBO (small bowel obstruction) ([place]) 09/16/2016  . Hypothyroidism 09/16/2016  . Leukocytosis 09/16/2016  . History of colonic polyps 08/25/2016    -Anticipated postoperative ileus, will continue NG tube for now.  -IV fluids currently D5 half-normal running at 50 mL's per hour.  Would continue to monitor electrolytes, anticipating possible potassium supplementation or adjustment maintenance IV fluids.  -No role for prophylactic antibiotics.  -DVT and GI prophylaxis present.  -Discontinue Foley.  -Out of bed/ambulation in room/hallway as tolerated All of the above findings and recommendations were discussed with the patient, patient's family, and the medical team, and all of patient's and family's questions were answered to their expressed satisfaction.  -- Ronny Bacon, M.D., [facility] 08/29/2020

## 2020-08-29 NOTE — Progress Notes (Signed)
Patient Demographics:    Megan Conley, is a 84 y.o. female, DOB - [birth year], ZDG:[id number]  Admit date - 08/27/2020   Admitting Physician Albertine Patricia, MD  Outpatient Primary MD for the patient is Celene Squibb, MD  LOS - 2   Chief Complaint  Patient presents with  . Abdominal Pain        Subjective:    Megan Conley  Reports post op pain is controlled. She has passed some gas. She has not had a bowel movement  Assessment  & Plan :    Principal Problem:   SBO (small bowel obstruction) (HCC) Active Problems:   Hypothyroidism   Elevated blood pressure reading without diagnosis of hypertension   Brief Summary: -84 y.o. female, with  history of hypothyroidism, history of previous small bowel obstruction in 2017 resolved without surgical intervention, admitted on 08/27/2020 with an episode of SBO with AKI and leukocytosis  A/p 1)SBO-failed conservative measures with NG tube, underwent  lysis of adhesions on 08/28/2020 with Dr. Constance Haw, - Plan for NG until bowel function. Then can adv as tolerated. -Give IV dextrose solution and check CBGs while n.p.o. -continue to await return of bowel function -she is passing gas -encouraged ambulation  2) leukocytosis--most likely reactive from #1 above -WBC is down to 11.5 from 17.0 on admission  3) hypothyroidism--resume when oral intake resumes however if oral intake is delayed another 48 to 72 hours consider IV replacement at 50% of oral dose  4)AKI-due to dehydration SBO improved with hydration -Baseline creatinine usually around 0.6-0.8 -renally adjust medications, avoid nephrotoxic agents / dehydration  / hypotension   Disposition/Need for in-Hospital Stay- patient unable to be discharged at this time due to --SBO status post lysis of adhesions awaiting return of bowel function, currently requiring IV fluids*  Status is:  Inpatient  Remains inpatient appropriate because:SBO status post lysis of adhesions awaiting return of bowel function, currently requiring IV fluids*   Disposition: The patient is from: Home              Anticipated d/c is to: Home              Anticipated d/c date is: 2 days              Patient currently is not medically stable to d/c. Barriers: Not Clinically Stable- -SBO status post lysis of adhesions awaiting return of bowel function, currently requiring IV fluids*   Code Status : Full  Family Communication: discussed with daughter at bedside  Consults  : General surgery  DVT Prophylaxis  :  Lovenox   SCDs *  Lab Results  Component Value Date   PLT 338 08/28/2020    Inpatient Medications  Scheduled Meds: . brimonidine  1 drop Both Eyes Daily  . Chlorhexidine Gluconate Cloth  6 each Topical Daily  . enoxaparin (LOVENOX) injection  30 mg Subcutaneous Q24H  . latanoprost  1 drop Both Eyes QHS   Continuous Infusions: . dextrose 5 % and 0.45% NaCl 50 mL/hr at 08/28/20 2219  . famotidine (PEPCID) IV 20 mg (08/28/20 2220)   PRN Meds:.hydrALAZINE, morphine injection, ondansetron **OR** ondansetron (ZOFRAN) IV    Anti-infectives (From admission, onward)   Start     Dose/Rate Route  Frequency Ordered Stop   08/28/20 1145  cefoTEtan (CEFOTAN) 2 g in sodium chloride 0.9 % 100 mL IVPB        2 g 200 mL/hr over 30 Minutes Intravenous On call to O.R. 08/28/20 1050 08/28/20 1303        Objective:   Vitals:   08/28/20 1509 08/28/20 2017 08/29/20 0104 08/29/20 1257  BP: (!) 164/81 (!) 170/90 (!) 177/87 (!) 115/58  Pulse: 90 (!) 101 89 64  Resp:  16 16 20   Temp:  98.4 F (36.9 C) 98.4 F (36.9 C) 97.6 F (36.4 C)  TempSrc:   Oral Oral  SpO2:  98% 96% 99%  Weight:      Height:        Wt Readings from Last 3 Encounters:  08/27/20 53.5 kg  08/26/20 54.4 kg  10/15/17 54.4 kg     Intake/Output Summary (Last 24 hours) at 08/29/2020 1616 Last data filed at  08/29/2020 1412 Gross per 24 hour  Intake 298.64 ml  Output 860 ml  Net -561.36 ml     Physical Exam  General exam: Alert, awake, oriented x 3, NG tube in place Respiratory system: Clear to auscultation. Respiratory effort normal. Cardiovascular system:RRR. No murmurs, rubs, gallops. Gastrointestinal system: Abdomen is nondistended, soft and nontender. No organomegaly or masses felt. Normal bowel sounds heard. Central nervous system: Alert and oriented. No focal neurological deficits. Extremities: No C/C/E, +pedal pulses Skin: No rashes, lesions or ulcers Psychiatry: Judgement and insight appear normal. Mood & affect appropriate.     Data Review:   Micro Results Recent Results (from the past 240 hour(s))  Respiratory Panel by RT PCR (Flu A&B, Covid) - Nasopharyngeal Swab     Status: None   Collection Time: 08/27/20 12:26 PM   Specimen: Nasopharyngeal Swab  Result Value Ref Range Status   SARS Coronavirus 2 by RT PCR NEGATIVE NEGATIVE Final    Comment: (NOTE) SARS-CoV-2 target nucleic acids are NOT DETECTED.  The SARS-CoV-2 RNA is generally detectable in upper respiratoy specimens during the acute phase of infection. The lowest concentration of SARS-CoV-2 viral copies this assay can detect is 131 copies/mL. A negative result does not preclude SARS-Cov-2 infection and should not be used as the sole basis for treatment or other patient management decisions. A negative result may occur with  improper specimen collection/handling, submission of specimen other than nasopharyngeal swab, presence of viral mutation(s) within the areas targeted by this assay, and inadequate number of viral copies (<131 copies/mL). A negative result must be combined with clinical observations, patient history, and epidemiological information. The expected result is Negative.  Fact Sheet for Patients:  PinkCheek.be  Fact Sheet for Healthcare Providers:   GravelBags.it  This test is no t yet approved or cleared by the [place] FDA and  has been authorized for detection and/or diagnosis of SARS-CoV-2 by FDA under an Emergency Use Authorization (EUA). This EUA will remain  in effect (meaning this test can be used) for the duration of the COVID-19 declaration under Section 564(b)(1) of the Act, 21 U.S.C. section 360bbb-3(b)(1), unless the authorization is terminated or revoked sooner.     Influenza A by PCR NEGATIVE NEGATIVE Final   Influenza B by PCR NEGATIVE NEGATIVE Final    Comment: (NOTE) The Xpert Xpress SARS-CoV-2/FLU/RSV assay is intended as an aid in  the diagnosis of influenza from Nasopharyngeal swab specimens and  should not be used as a sole basis for treatment. Nasal washings and  aspirates are unacceptable  for Xpert Xpress SARS-CoV-2/FLU/RSV  testing.  Fact Sheet for Patients: PinkCheek.be  Fact Sheet for Healthcare Providers: GravelBags.it  This test is not yet approved or cleared by the [place] FDA and  has been authorized for detection and/or diagnosis of SARS-CoV-2 by  FDA under an Emergency Use Authorization (EUA). This EUA will remain  in effect (meaning this test can be used) for the duration of the  Covid-19 declaration under Section 564(b)(1) of the Act, 21  U.S.C. section 360bbb-3(b)(1), unless the authorization is  terminated or revoked. Performed at [facility], [address] [place], [place] [address]     Radiology Reports CT Abdomen Pelvis W Contrast  Result Date: 08/27/2020 CLINICAL DATA:  Acute left-sided abdominal pain. EXAM: CT ABDOMEN AND PELVIS WITH CONTRAST TECHNIQUE: Multidetector CT imaging of the abdomen and pelvis was performed using the standard protocol following bolus administration of intravenous contrast. CONTRAST:  48mL OMNIPAQUE IOHEXOL 300 MG/ML  SOLN COMPARISON:  August 26, 2020.  FINDINGS: Lower chest: No acute abnormality. Hepatobiliary: No focal liver abnormality is seen. No gallstones, gallbladder wall thickening, or biliary dilatation. Pancreas: Unremarkable. No pancreatic ductal dilatation or surrounding inflammatory changes. Spleen: Normal in size without focal abnormality. Adrenals/Urinary Tract: Adrenal glands are unremarkable. Kidneys are normal, without renal calculi, focal lesion, or hydronephrosis. Bladder is unremarkable. Stomach/Bowel: The stomach appears normal. There is significantly increased small bowel dilatation with transition zone seen in the left lower quadrant, best noted on image number 36 of series 5. This is concerning for adhesion. Stool is noted throughout the colon. No colonic dilatation is noted. The appendix is not visualized. Vascular/Lymphatic: Aortic atherosclerosis. No enlarged abdominal or pelvic lymph nodes. Reproductive: Status post hysterectomy. No adnexal masses. Other: Mild amount of free fluid is noted in the dependent portion the pelvis which is increased compared to prior exam. No definite hernia is noted. Musculoskeletal: No acute or significant osseous findings. IMPRESSION: 1. There is significantly increased small bowel dilatation consistent with obstruction with transition zone seen in the left lower quadrant, concerning for adhesion. Mild amount of free fluid is noted in the dependent portion of the pelvis which is increased compared to prior exam. 2. Aortic atherosclerosis. Aortic Atherosclerosis (ICD10-I70.0). Electronically Signed   By: Marijo Conception M.D.   On: 08/27/2020 14:26   CT Abdomen Pelvis W Contrast  Result Date: 08/26/2020 CLINICAL DATA:  Lower abdominal pain. EXAM: CT ABDOMEN AND PELVIS WITH CONTRAST TECHNIQUE: Multidetector CT imaging of the abdomen and pelvis was performed using the standard protocol following bolus administration of intravenous contrast. CONTRAST:  95mL OMNIPAQUE IOHEXOL 300 MG/ML  SOLN COMPARISON:   None. FINDINGS: Lower chest: Mild bibasilar atelectasis. No consolidation or pleural effusions. Hepatobiliary: No focal liver abnormality is seen. No gallstones, gallbladder wall thickening, or biliary dilatation. Pancreas: Unremarkable. No pancreatic ductal dilatation or surrounding inflammatory changes. Spleen: Normal in size without focal abnormality. Adrenals/Urinary Tract: Adrenal glands are unremarkable. Kidneys are normal, without renal calculi, focal lesion, or hydronephrosis. Bladder is unremarkable. Stomach/Bowel: The duodenum does not cross midline with jejunum in the right abdomen, compatible with mild rotation. No evidence of bowel obstruction. Mild wall thickening and mucosal hyperenhancement of small bowel loops in the left lower quadrant with a small amount of surrounding fluid, concerning for enteritis. Vascular/Lymphatic: No significant vascular findings are present. No enlarged abdominal or pelvic lymph nodes. Aorta bi-iliac atherosclerosis. Reproductive: Status post hysterectomy. Other: Small volume of free fluid in the anatomic pelvis. No evidence of free air. Musculoskeletal: Similar remote T11  compression fracture without progressive height loss. Osteopenia. IMPRESSION: 1. Mild inflammatory change of small bowel loops in the left lower quadrant with a small amount of surrounding fluid, concerning for enteritis that may be infectious, inflammatory, or ischemic. 2. No evidence of obstruction. 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 Electronically Signed   By: Margaretha Sheffield MD   On: 08/26/2020 09:55   DG Abd Portable 1V  Result Date: 08/28/2020 CLINICAL DATA:  Abdominal pain. EXAM: PORTABLE ABDOMEN - 1 VIEW COMPARISON:  Prior study same day. FINDINGS: NG tube noted coiled stomach. Dilated loops of small bowel again noted. Contrast again noted throughout the small bowel without significant interim movement. No significant amount of contrast in the colon. No free  air. IMPRESSION: 1. NG tube noted coiled in the stomach. 2. Dilated loops of small bowel again noted. Contrast again noted throughout the small bowel without significant interim movement. No significant amount of contrast in the colon. Electronically Signed   By: Marcello Moores  Register   On: 08/28/2020 07:43   DG Abd Portable 1V-Small Bowel Obstruction Protocol-initial, 8 hr delay  Result Date: 08/28/2020 CLINICAL DATA:  8 hour delay small-bowel obstruction EXAM: PORTABLE ABDOMEN - 1 VIEW COMPARISON:  08/27/2020 FINDINGS: There is contrast material within multiple loops of small bowel in the pelvis but no definite contrast in the colon. Small bowel remains dilated. IMPRESSION: Contrast material in the distal small bowel but none visualized within the colon. Electronically Signed   By: Ulyses Jarred M.D.   On: 08/28/2020 02:22   DG Abd Portable 1V-Small Bowel Protocol-Position Verification  Result Date: 08/27/2020 CLINICAL DATA:  NG tube placement EXAM: PORTABLE ABDOMEN - 1 VIEW COMPARISON:  None. FINDINGS: NG tube within stomach. Tip is in the gastric antrum. Side port within the stomach. Mildly dilated loops of small bowel measuring up to 2.9 cm. IMPRESSION: NG tube in good position. Electronically Signed   By: Suzy Bouchard M.D.   On: 08/27/2020 17:48     CBC Recent Labs  Lab 08/26/20 0814 08/27/20 1200 08/28/20 0514  WBC 6.9 17.0* 11.5*  HGB 14.0 16.0* 14.6  HCT 43.8 48.4* 46.6*  PLT 318 338 338  MCV 98.0 97.6 100.0  MCH 31.3 32.3 31.3  MCHC 32.0 33.1 31.3  RDW 12.5 13.0 13.2  LYMPHSABS  --  1.1 0.8  MONOABS  --  1.0 0.8  EOSABS  --  0.0 0.0  BASOSABS  --  0.1 0.0    Chemistries  Recent Labs  Lab 08/26/20 0814 08/27/20 1200 08/28/20 0514  NA 138 134* 139  K 4.0 4.3 3.8  CL 105 99 103  CO2 26 22 22   GLUCOSE 97 139* 116*  BUN 17 26* 27*  CREATININE 0.81 1.08* 0.94  CALCIUM 9.1 9.5 9.0  AST 21  --   --   ALT 18  --   --   ALKPHOS 77  --   --   BILITOT 0.7  --   --     ------------------------------------------------------------------------------------------------------------------ No results for input(s): CHOL, HDL, LDLCALC, TRIG, CHOLHDL, LDLDIRECT in the last 72 hours.  Lab Results  Component Value Date   HGBA1C 5.5 09/18/2016   ------------------------------------------------------------------------------------------------------------------ No results for input(s): TSH, T4TOTAL, T3FREE, THYROIDAB in the last 72 hours.  Invalid input(s): FREET3 ------------------------------------------------------------------------------------------------------------------ No results for input(s): VITAMINB12, FOLATE, FERRITIN, TIBC, IRON, RETICCTPCT in the last 72 hours.  Coagulation profile No results for input(s): INR, PROTIME in the last 168 hours.  No results for input(s):  DDIMER in the last 72 hours.  Cardiac Enzymes No results for input(s): CKMB, TROPONINI, MYOGLOBIN in the last 168 hours.  Invalid input(s): CK ------------------------------------------------------------------------------------------------------------------ No results found for: BNP   Kathie Dike M.D on 08/29/2020 at 4:16 PM  Go to [email or web address] - for contact info  [facility] - Office  [phone]

## 2020-08-29 NOTE — TOC Initial Note (Signed)
Transition of Care [facility]) - Initial/Assessment Note    Patient Details  Name: Megan Conley MRN: [id number] Date of Birth: [birth year]  [facility] CM/SW Contact:    Natasha Bence, LCSW Phone Number: 08/29/2020, 12:22 PM  Clinical Narrative:                 Patient is an 84 year old female admitted for small bowel obstruction. CSW received HH recommendation for PT. CSW contacted patient's daughter for agreeableness to referral. Daughter agreeable to referral. CS[facility] placed referral with [facility]. [facility] agreeable to provide [facility] services for patient. TOC to follow.         Patient Goals and CMS Choice        Expected Discharge Plan and Services                                                Prior Living Arrangements/Services                       Activities of Daily Living Home Assistive Devices/Equipment: Eyeglasses ADL Screening (condition at time of admission) Patient's cognitive ability adequate to safely complete daily activities?: Yes Is the patient deaf or have difficulty hearing?: No Does the patient have difficulty seeing, even when wearing glasses/contacts?: No Does the patient have difficulty concentrating, remembering, or making decisions?: No Patient able to express need for assistance with ADLs?: Yes Does the patient have difficulty dressing or bathing?: No Independently performs ADLs?: Yes (appropriate for developmental age) Does the patient have difficulty walking or climbing stairs?: No Weakness of Legs: None Weakness of Arms/Hands: None  Permission Sought/Granted                  Emotional Assessment              Admission diagnosis:  Small bowel obstruction ([place]) [K56.609] SBO (small bowel obstruction) ([place]) [K56.609] Encounter for imaging study to confirm nasogastric (NG) tube placement [Z01.89] Patient Active Problem List   Diagnosis Date Noted  . Elevated blood pressure reading without  diagnosis of hypertension 09/17/2016  . Hyperglycemia 09/17/2016  . Small bowel obstruction ([place]) 09/16/2016  . SBO (small bowel obstruction) ([place]) 09/16/2016  . Hypothyroidism 09/16/2016  . Leukocytosis 09/16/2016  . History of colonic polyps 08/25/2016   PCP:  Celene Squibb, MD Pharmacy:   [place], [place] [address] Phone: [phone] Fax: [phone]  [id number] - [place], [address] [place] [address] Phone: [phone] Fax: [phone]     Social Determinants of Health (SDOH) Interventions    Readmission Risk Interventions No flowsheet data found.

## 2020-08-30 LAB — BASIC METABOLIC PANEL
Anion gap: 6 (ref 5–15)
BUN: 22 mg/dL (ref 8–23)
CO2: 28 mmol/L (ref 22–32)
Calcium: 8.1 mg/dL — ABNORMAL LOW (ref 8.9–10.3)
Chloride: 105 mmol/L (ref 98–111)
Creatinine, Ser: 0.59 mg/dL (ref 0.44–1.00)
GFR, Estimated: >60 mL/min (ref >60)
Glucose, Bld: 99 mg/dL (ref 70–99)
Potassium: 3.5 mmol/L (ref 3.5–5.1)
Sodium: 139 mmol/L (ref 135–145)

## 2020-08-30 LAB — GLUCOSE, CAPILLARY
Glucose-Capillary: 106 mg/dL — ABNORMAL HIGH (ref 70–99)
Glucose-Capillary: 108 mg/dL — ABNORMAL HIGH (ref 70–99)
Glucose-Capillary: 88 mg/dL (ref 70–99)
Glucose-Capillary: 97 mg/dL (ref 70–99)
Glucose-Capillary: 98 mg/dL (ref 70–99)

## 2020-08-30 MED ORDER — POTASSIUM CHLORIDE CRYS ER 20 MEQ PO TBCR
40.0000 meq | EXTENDED_RELEASE_TABLET | Freq: Once | ORAL | Status: AC
  Administered 2020-08-30: 40 meq via ORAL
  Filled 2020-08-30: qty 2

## 2020-08-30 MED ORDER — BISACODYL 10 MG RE SUPP
10.0000 mg | Freq: Once | RECTAL | Status: AC
  Administered 2020-08-30: 10 mg via RECTAL
  Filled 2020-08-30: qty 1

## 2020-08-30 MED ORDER — ENOXAPARIN SODIUM 40 MG/0.4ML ~~LOC~~ SOLN
40.0000 mg | SUBCUTANEOUS | Status: DC
  Administered 2020-08-30: 40 mg via SUBCUTANEOUS
  Filled 2020-08-30: qty 0.4

## 2020-08-30 MED ORDER — PHENOL 1.4 % MT LIQD
1.0000 | OROMUCOSAL | Status: DC | PRN
  Filled 2020-08-30: qty 177

## 2020-08-30 NOTE — Progress Notes (Signed)
[place] Hospital Day(s): 3.   Post op day(s): 2 Days Post-Op.   Interval History: Patient seen and examined, no acute events or new complaints overnight. Patient reports flatus, but yet to have a bowel movement.  Reports good pain control.  Has walked to the nurses station, grand daughter present in room.  Minimal NG output noted, denies nausea or vomiting.   Vital signs in last 24 hours: [min-max] current  Temp:  [97.6 F (36.4 C)-98.6 F (37 C)] 98.5 F (36.9 C) (10/31 0641) Pulse Rate:  [64-77] 77 (10/31 0641) Resp:  [18-20] 18 (10/31 0641) BP: (115-153)/(58-85) 151/85 (10/31 0641) SpO2:  [93 %-99 %] 93 % (10/30 2234)     Height: 4\' 11"  (149.9 cm) Weight: 53.5 kg BMI (Calculated): 23.81   Intake/Output last 2 shifts:  10/30 0701 - 10/31 0700 In: -  Out: 260 [Urine:260]   Physical Exam:  Constitutional: alert, cooperative and no distress  Respiratory: breathing non-labored at rest  Cardiovascular: regular rate and sinus rhythm  Gastrointestinal: soft, non-tender, and non-distended, abdominal binder in place. Integumentary: Honeycomb dressing without staining.  Skin intact.  I proceeded with removal of the NG tube.  Labs:  CBC Latest Ref Rng & Units 08/28/2020 08/27/2020 08/26/2020  WBC 4.0 - 10.5 K/uL 11.5(H) 17.0(H) 6.9  Hemoglobin 12.0 - 15.0 g/dL 14.6 16.0(H) 14.0  Hematocrit 36 - 46 % 46.6(H) 48.4(H) 43.8  Platelets 150 - 400 K/uL 338 338 318   CMP Latest Ref Rng & Units 08/30/2020 08/28/2020 08/27/2020  Glucose 70 - 99 mg/dL 99 116(H) 139(H)  BUN 8 - 23 mg/dL 22 27(H) 26(H)  Creatinine 0.44 - 1.00 mg/dL 0.59 0.94 1.08(H)  Sodium 135 - 145 mmol/L 139 139 134(L)  Potassium 3.5 - 5.1 mmol/L 3.5 3.8 4.3  Chloride 98 - 111 mmol/L 105 103 99  CO2 22 - 32 mmol/L 28 22 22   Calcium 8.9 - 10.3 mg/dL 8.1(L) 9.0 9.5  Total Protein 6.5 - 8.1 g/dL - - -  Total Bilirubin 0.3 - 1.2 mg/dL - - -  Alkaline Phos 38 - 126 U/L - - -   AST 15 - 41 U/L - - -  ALT 0 - 44 U/L - - -     Imaging studies: No new pertinent imaging studies   Assessment/Plan:  84 y.o. female with history of small bowel obstruction 2 Days Post-Op s/p lysis of adhesions, complicated by pertinent comorbidities including.  Patient Active Problem List   Diagnosis Date Noted  . Elevated blood pressure reading without diagnosis of hypertension 09/17/2016  . Hyperglycemia 09/17/2016  . Small bowel obstruction ([place]) 09/16/2016  . SBO (small bowel obstruction) ([employer]) 09/16/2016  . Hypothyroidism 09/16/2016  . Leukocytosis 09/16/2016  . History of colonic polyps 08/25/2016    -Resolving postoperative ileus, will discontinue NG tube.  -IV fluids currently D5 half-normal running at 50 mL's per hour.  Potassium 3.5 today.  Would continue to monitor electrolytes, anticipating possible potassium supplementation or adjustment maintenance IV fluids.  BUN and creatinine look good.  -No role for prophylactic antibiotics.  -DVT and GI prophylaxis present.  -Voiding well.  -May initiate clear liquid diet, anticipating being able to advance as tolerated.  -Out of bed/ambulation in room/hallway as tolerated All of the above findings and recommendations were discussed with the patient, patient's family, and the medical team, and all of patient's and family's questions were answered to their expressed satisfaction.  -- Ronny Bacon, M.D., [facility] 08/30/2020

## 2020-08-30 NOTE — Progress Notes (Signed)
Patient Demographics:    Megan Conley, is a 84 y.o. female, DOB - 05/25/1936, MAU:[id number]  Admit date - 08/27/2020   Admitting Physician Albertine Patricia, MD  Outpatient Primary MD for the patient is Celene Squibb, MD  LOS - 3   Chief Complaint  Patient presents with  . Abdominal Pain        Subjective:    Megan Conley she is not had a bowel movement, but is passing gas.  Minimal output through NG tube.  No abdominal pain or nausea.  Assessment  & Plan :    Principal Problem:   SBO (small bowel obstruction) (HCC) Active Problems:   Hypothyroidism   Elevated blood pressure reading without diagnosis of hypertension   Brief Summary: -84 y.o. female, with  history of hypothyroidism, history of previous small bowel obstruction in 2017 resolved without surgical intervention, admitted on 08/27/2020 with an episode of SBO with AKI and leukocytosis  A/p 1)SBO-failed conservative measures with NG tube, underwent  lysis of adhesions on 08/28/2020 with Dr. Constance Haw, -NG tube discontinued today per general surgery -She is starting to pass gas -encouraged ambulation -We will give suppository -Started on clear liquids  2) leukocytosis--most likely reactive from #1 above -WBC is down to 11.5 from 17.0 on admission  3) hypothyroidism--resume when oral intake resumes however if oral intake is delayed another 48 to 72 hours consider IV replacement at 50% of oral dose  4)AKI-due to dehydration SBO improved with hydration -Baseline creatinine usually around 0.6-0.8 -renally adjust medications, avoid nephrotoxic agents / dehydration  / hypotension   Disposition/Need for in-Hospital Stay- patient unable to be discharged at this time due to --SBO status post lysis of adhesions awaiting return of bowel function, currently requiring IV fluids*  Status is: Inpatient  Remains inpatient appropriate  because:SBO status post lysis of adhesions awaiting return of bowel function, currently requiring IV fluids*   Disposition: The patient is from: Home              Anticipated d/c is to: Home              Anticipated d/c date is: 2 days              Patient currently is not medically stable to d/c. Barriers: Not Clinically Stable- -SBO status post lysis of adhesions awaiting return of bowel function, currently requiring IV fluids*   Code Status : Full  Family Communication: discussed with daughter at bedside  Consults  : General surgery  DVT Prophylaxis  :  Lovenox   SCDs *  Lab Results  Component Value Date   PLT 338 08/28/2020    Inpatient Medications  Scheduled Meds: . brimonidine  1 drop Both Eyes Daily  . Chlorhexidine Gluconate Cloth  6 each Topical Daily  . enoxaparin (LOVENOX) injection  40 mg Subcutaneous Q24H  . latanoprost  1 drop Both Eyes QHS   Continuous Infusions: . dextrose 5 % and 0.45% NaCl 50 mL/hr at 08/28/20 2219  . famotidine (PEPCID) IV 20 mg (08/30/20 0043)   PRN Meds:.hydrALAZINE, morphine injection, ondansetron **OR** ondansetron (ZOFRAN) IV, phenol    Anti-infectives (From admission, onward)   Start     Dose/Rate Route Frequency Ordered Stop   08/28/20  1145  cefoTEtan (CEFOTAN) 2 g in sodium chloride 0.9 % 100 mL IVPB        2 g 200 mL/hr over 30 Minutes Intravenous On call to O.R. 08/28/20 1050 08/28/20 1303        Objective:   Vitals:   08/29/20 1257 08/29/20 2234 08/30/20 0641 08/30/20 1252  BP: (!) 115/58 (!) 153/81 (!) 151/85 119/64  Pulse: 64 65 77 68  Resp: 20 18 18 18   Temp: 97.6 F (36.4 C) 98.6 F (37 C) 98.5 F (36.9 C) 98.1 F (36.7 C)  TempSrc: Oral Oral Oral Oral  SpO2: 99% 93%  95%  Weight:      Height:        Wt Readings from Last 3 Encounters:  08/27/20 53.5 kg  08/26/20 54.4 kg  10/15/17 54.4 kg    No intake or output data in the 24 hours ending 08/30/20 1909   Physical Exam  General exam: Alert,  awake, oriented x 3, NG tube in place Respiratory system: Clear to auscultation. Respiratory effort normal. Cardiovascular system:RRR. No murmurs, rubs, gallops. Gastrointestinal system: Abdomen is nondistended, soft and nontender. No organomegaly or masses felt. Normal bowel sounds heard. Central nervous system: Alert and oriented. No focal neurological deficits. Extremities: No C/C/E, +pedal pulses Skin: No rashes, lesions or ulcers Psychiatry: Judgement and insight appear normal. Mood & affect appropriate.      Data Review:   Micro Results Recent Results (from the past 240 hour(s))  Respiratory Panel by RT PCR (Flu A&B, Covid) - Nasopharyngeal Swab     Status: None   Collection Time: 08/27/20 12:26 PM   Specimen: Nasopharyngeal Swab  Result Value Ref Range Status   SARS Coronavirus 2 by RT PCR NEGATIVE NEGATIVE Final    Comment: (NOTE) SARS-CoV-2 target nucleic acids are NOT DETECTED.  The SARS-CoV-2 RNA is generally detectable in upper respiratoy specimens during the acute phase of infection. The lowest concentration of SARS-CoV-2 viral copies this assay can detect is 131 copies/mL. A negative result does not preclude SARS-Cov-2 infection and should not be used as the sole basis for treatment or other patient management decisions. A negative result may occur with  improper specimen collection/handling, submission of specimen other than nasopharyngeal swab, presence of viral mutation(s) within the areas targeted by this assay, and inadequate number of viral copies (<131 copies/mL). A negative result must be combined with clinical observations, patient history, and epidemiological information. The expected result is Negative.  Fact Sheet for Patients:  PinkCheek.be  Fact Sheet for Healthcare Providers:  GravelBags.it  This test is no t yet approved or cleared by the [place] FDA and  has been authorized for  detection and/or diagnosis of SARS-CoV-2 by FDA under an Emergency Use Authorization (EUA). This EUA will remain  in effect (meaning this test can be used) for the duration of the COVID-19 declaration under Section 564(b)(1) of the Act, 21 U.S.C. section 360bbb-3(b)(1), unless the authorization is terminated or revoked sooner.     Influenza A by PCR NEGATIVE NEGATIVE Final   Influenza B by PCR NEGATIVE NEGATIVE Final    Comment: (NOTE) The Xpert Xpress SARS-CoV-2/FLU/RSV assay is intended as an aid in  the diagnosis of influenza from Nasopharyngeal swab specimens and  should not be used as a sole basis for treatment. Nasal washings and  aspirates are unacceptable for Xpert Xpress SARS-CoV-2/FLU/RSV  testing.  Fact Sheet for Patients: PinkCheek.be  Fact Sheet for Healthcare Providers: GravelBags.it  This test is not yet  approved or cleared by the [place] and  has been authorized for detection and/or diagnosis of SARS-CoV-2 by  FDA under an Emergency Use Authorization (EUA). This EUA will remain  in effect (meaning this test can be used) for the duration of the  Covid-19 declaration under Section 564(b)(1) of the Act, 21  U.S.C. section 360bbb-3(b)(1), unless the authorization is  terminated or revoked. Performed at [facility], [address] [place], [place] [address]     Radiology Reports CT Abdomen Pelvis W Contrast  Result Date: 08/27/2020 CLINICAL DATA:  Acute left-sided abdominal pain. EXAM: CT ABDOMEN AND PELVIS WITH CONTRAST TECHNIQUE: Multidetector CT imaging of the abdomen and pelvis was performed using the standard protocol following bolus administration of intravenous contrast. CONTRAST:  73mL OMNIPAQUE IOHEXOL 300 MG/ML  SOLN COMPARISON:  August 26, 2020. FINDINGS: Lower chest: No acute abnormality. Hepatobiliary: No focal liver abnormality is seen. No gallstones, gallbladder wall thickening, or  biliary dilatation. Pancreas: Unremarkable. No pancreatic ductal dilatation or surrounding inflammatory changes. Spleen: Normal in size without focal abnormality. Adrenals/Urinary Tract: Adrenal glands are unremarkable. Kidneys are normal, without renal calculi, focal lesion, or hydronephrosis. Bladder is unremarkable. Stomach/Bowel: The stomach appears normal. There is significantly increased small bowel dilatation with transition zone seen in the left lower quadrant, best noted on image number 36 of series 5. This is concerning for adhesion. Stool is noted throughout the colon. No colonic dilatation is noted. The appendix is not visualized. Vascular/Lymphatic: Aortic atherosclerosis. No enlarged abdominal or pelvic lymph nodes. Reproductive: Status post hysterectomy. No adnexal masses. Other: Mild amount of free fluid is noted in the dependent portion the pelvis which is increased compared to prior exam. No definite hernia is noted. Musculoskeletal: No acute or significant osseous findings. IMPRESSION: 1. There is significantly increased small bowel dilatation consistent with obstruction with transition zone seen in the left lower quadrant, concerning for adhesion. Mild amount of free fluid is noted in the dependent portion of the pelvis which is increased compared to prior exam. 2. Aortic atherosclerosis. Aortic Atherosclerosis (ICD10-I70.0). Electronically Signed   By: Marijo Conception M.D.   On: 08/27/2020 14:26   CT Abdomen Pelvis W Contrast  Result Date: 08/26/2020 CLINICAL DATA:  Lower abdominal pain. EXAM: CT ABDOMEN AND PELVIS WITH CONTRAST TECHNIQUE: Multidetector CT imaging of the abdomen and pelvis was performed using the standard protocol following bolus administration of intravenous contrast. CONTRAST:  78mL OMNIPAQUE IOHEXOL 300 MG/ML  SOLN COMPARISON:  None. FINDINGS: Lower chest: Mild bibasilar atelectasis. No consolidation or pleural effusions. Hepatobiliary: No focal liver abnormality is  seen. No gallstones, gallbladder wall thickening, or biliary dilatation. Pancreas: Unremarkable. No pancreatic ductal dilatation or surrounding inflammatory changes. Spleen: Normal in size without focal abnormality. Adrenals/Urinary Tract: Adrenal glands are unremarkable. Kidneys are normal, without renal calculi, focal lesion, or hydronephrosis. Bladder is unremarkable. Stomach/Bowel: The duodenum does not cross midline with jejunum in the right abdomen, compatible with mild rotation. No evidence of bowel obstruction. Mild wall thickening and mucosal hyperenhancement of small bowel loops in the left lower quadrant with a small amount of surrounding fluid, concerning for enteritis. Vascular/Lymphatic: No significant vascular findings are present. No enlarged abdominal or pelvic lymph nodes. Aorta bi-iliac atherosclerosis. Reproductive: Status post hysterectomy. Other: Small volume of free fluid in the anatomic pelvis. No evidence of free air. Musculoskeletal: Similar remote T11 compression fracture without progressive height loss. Osteopenia. IMPRESSION: 1. Mild inflammatory change of small bowel loops in the left lower quadrant with a small amount  of surrounding fluid, concerning for enteritis that may be infectious, inflammatory, or ischemic. 2. No evidence of obstruction. 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 Electronically Signed   By: Margaretha Sheffield MD   On: 08/26/2020 09:55   DG Abd Portable 1V  Result Date: 08/28/2020 CLINICAL DATA:  Abdominal pain. EXAM: PORTABLE ABDOMEN - 1 VIEW COMPARISON:  Prior study same day. FINDINGS: NG tube noted coiled stomach. Dilated loops of small bowel again noted. Contrast again noted throughout the small bowel without significant interim movement. No significant amount of contrast in the colon. No free air. IMPRESSION: 1. NG tube noted coiled in the stomach. 2. Dilated loops of small bowel again noted. Contrast again noted throughout the  small bowel without significant interim movement. No significant amount of contrast in the colon. Electronically Signed   By: Marcello Moores  Register   On: 08/28/2020 07:43   DG Abd Portable 1V-Small Bowel Obstruction Protocol-initial, 8 hr delay  Result Date: 08/28/2020 CLINICAL DATA:  8 hour delay small-bowel obstruction EXAM: PORTABLE ABDOMEN - 1 VIEW COMPARISON:  08/27/2020 FINDINGS: There is contrast material within multiple loops of small bowel in the pelvis but no definite contrast in the colon. Small bowel remains dilated. IMPRESSION: Contrast material in the distal small bowel but none visualized within the colon. Electronically Signed   By: Ulyses Jarred M.D.   On: 08/28/2020 02:22   DG Abd Portable 1V-Small Bowel Protocol-Position Verification  Result Date: 08/27/2020 CLINICAL DATA:  NG tube placement EXAM: PORTABLE ABDOMEN - 1 VIEW COMPARISON:  None. FINDINGS: NG tube within stomach. Tip is in the gastric antrum. Side port within the stomach. Mildly dilated loops of small bowel measuring up to 2.9 cm. IMPRESSION: NG tube in good position. Electronically Signed   By: Suzy Bouchard M.D.   On: 08/27/2020 17:48     CBC Recent Labs  Lab 08/26/20 0814 08/27/20 1200 08/28/20 0514  WBC 6.9 17.0* 11.5*  HGB 14.0 16.0* 14.6  HCT 43.8 48.4* 46.6*  PLT 318 338 338  MCV 98.0 97.6 100.0  MCH 31.3 32.3 31.3  MCHC 32.0 33.1 31.3  RDW 12.5 13.0 13.2  LYMPHSABS  --  1.1 0.8  MONOABS  --  1.0 0.8  EOSABS  --  0.0 0.0  BASOSABS  --  0.1 0.0    Chemistries  Recent Labs  Lab 08/26/20 0814 08/27/20 1200 08/28/20 0514 08/30/20 0622  NA 138 134* 139 139  K 4.0 4.3 3.8 3.5  CL 105 99 103 105  CO2 26 22 22 28   GLUCOSE 97 139* 116* 99  BUN 17 26* 27* 22  CREATININE 0.81 1.08* 0.94 0.59  CALCIUM 9.1 9.5 9.0 8.1*  AST 21  --   --   --   ALT 18  --   --   --   ALKPHOS 77  --   --   --   BILITOT 0.7  --   --   --     ------------------------------------------------------------------------------------------------------------------ No results for input(s): CHOL, HDL, LDLCALC, TRIG, CHOLHDL, LDLDIRECT in the last 72 hours.  Lab Results  Component Value Date   HGBA1C 5.5 09/18/2016   ------------------------------------------------------------------------------------------------------------------ No results for input(s): TSH, T4TOTAL, T3FREE, THYROIDAB in the last 72 hours.  Invalid input(s): FREET3 ------------------------------------------------------------------------------------------------------------------ No results for input(s): VITAMINB12, FOLATE, FERRITIN, TIBC, IRON, RETICCTPCT in the last 72 hours.  Coagulation profile No results for input(s): INR, PROTIME in the last 168 hours.  No results for input(s): DDIMER in the  last 72 hours.  Cardiac Enzymes No results for input(s): CKMB, TROPONINI, MYOGLOBIN in the last 168 hours.  Invalid input(s): CK ------------------------------------------------------------------------------------------------------------------ No results found for: BNP   Kathie Dike M.D on 08/30/2020 at 7:09 PM  Go to [email or web address] - for contact info  [facility] - Office  [phone]

## 2020-08-31 ENCOUNTER — Encounter (HOSPITAL_COMMUNITY): Payer: Self-pay | Admitting: General Surgery

## 2020-08-31 LAB — GLUCOSE, CAPILLARY: Glucose-Capillary: 88 mg/dL (ref 70–99)

## 2020-08-31 MED ORDER — OXYCODONE HCL 5 MG PO TABS
5.0000 mg | ORAL_TABLET | ORAL | Status: DC | PRN
  Administered 2020-08-31: 5 mg via ORAL
  Filled 2020-08-31: qty 1

## 2020-08-31 MED ORDER — DOCUSATE SODIUM 100 MG PO CAPS
100.0000 mg | ORAL_CAPSULE | Freq: Two times a day (BID) | ORAL | Status: DC
  Administered 2020-08-31: 100 mg via ORAL
  Filled 2020-08-31: qty 1

## 2020-08-31 NOTE — Progress Notes (Signed)
Patient Demographics:    Megan Conley, is a 84 y.o. female, DOB - 22-Jan-1936, ZOX:[id number]  Admit date - 08/27/2020   Admitting Physician Albertine Patricia, MD  Outpatient Primary MD for the patient is Celene Squibb, MD  LOS - 4   Chief Complaint  Patient presents with  . Abdominal Pain        Subjective:    Megan Conley she had two bowel movements yesterday.  Tolerating liquids.  No vomiting.  Assessment  & Plan :    Principal Problem:   SBO (small bowel obstruction) (HCC) Active Problems:   Hypothyroidism   Elevated blood pressure reading without diagnosis of hypertension   Brief Summary: -84 y.o. female, with  history of hypothyroidism, history of previous small bowel obstruction in 2017 resolved without surgical intervention, admitted on 08/27/2020 with an episode of SBO with AKI and leukocytosis  A/p 1)SBO-failed conservative measures with NG tube, underwent  lysis of adhesions on 08/28/2020 with Dr. Constance Haw, -NG tube discontinued on 10/31 -Patient is starting to have bowel movements -Tolerating clear liquids, will advance to solid diet -Anticipate discharge in next 24 hours if she tolerates solid food  2) leukocytosis--most likely reactive from #1 above -WBC is down to 11.5 from 17.0 on admission  3) hypothyroidism--resume when oral intake resumes however if oral intake is delayed another 48 to 72 hours consider IV replacement at 50% of oral dose  4)AKI-due to dehydration SBO improved with hydration -Baseline creatinine usually around 0.6-0.8 -renally adjust medications, avoid nephrotoxic agents / dehydration  / hypotension   Disposition/Need for in-Hospital Stay- patient unable to be discharged at this time due to --SBO status post lysis of adhesions awaiting return of bowel function, currently requiring IV fluids*  Status is: Inpatient  Remains inpatient appropriate  because:SBO status post lysis of adhesions awaiting return of bowel function, currently requiring IV fluids*   Disposition: The patient is from: Home              Anticipated d/c is to: Home              Anticipated d/c date is: 1 day              Patient currently is not medically stable to d/c. Barriers: Not Clinically Stable- -SBO status post lysis of adhesions awaiting return of bowel function, currently requiring IV fluids*   Code Status : Full  Family Communication: discussed with daughter at bedside  Consults  : General surgery  DVT Prophylaxis  :  Lovenox   SCDs *  Lab Results  Component Value Date   PLT 338 08/28/2020    Inpatient Medications  Scheduled Meds: . brimonidine  1 drop Both Eyes Daily  . Chlorhexidine Gluconate Cloth  6 each Topical Daily  . docusate sodium  100 mg Oral BID  . enoxaparin (LOVENOX) injection  40 mg Subcutaneous Q24H  . latanoprost  1 drop Both Eyes QHS   Continuous Infusions: . famotidine (PEPCID) IV 20 mg (08/30/20 2059)   PRN Meds:.hydrALAZINE, morphine injection, ondansetron **OR** ondansetron (ZOFRAN) IV, oxyCODONE, phenol    Anti-infectives (From admission, onward)   Start     Dose/Rate Route Frequency Ordered Stop   08/28/20 1145  cefoTEtan (CEFOTAN) 2 g  in sodium chloride 0.9 % 100 mL IVPB        2 g 200 mL/hr over 30 Minutes Intravenous On call to O.R. 08/28/20 1050 08/28/20 1303        Objective:   Vitals:   08/30/20 1252 08/30/20 2004 08/31/20 0544 08/31/20 1436  BP: 119/64 (!) 165/82 (!) 154/79 115/63  Pulse: 68 82 79 67  Resp: 18 16 18 16   Temp: 98.1 F (36.7 C) 99.4 F (37.4 C) 98.5 F (36.9 C) 97.6 F (36.4 C)  TempSrc: Oral Oral Oral Oral  SpO2: 95% 94% 93% 96%  Weight:      Height:        Wt Readings from Last 3 Encounters:  08/27/20 53.5 kg  08/26/20 54.4 kg  10/15/17 54.4 kg     Intake/Output Summary (Last 24 hours) at 08/31/2020 1845 Last data filed at 08/31/2020 0000 Gross per 24 hour    Intake 120 ml  Output --  Net 120 ml     Physical Exam  General exam: Alert, awake, oriented x 3 Respiratory system: Clear to auscultation. Respiratory effort normal. Cardiovascular system:RRR. No murmurs, rubs, gallops. Gastrointestinal system: Abdomen is nondistended, soft and nontender. No organomegaly or masses felt. Normal bowel sounds heard. Central nervous system: Alert and oriented. No focal neurological deficits. Extremities: No C/C/E, +pedal pulses Skin: No rashes, lesions or ulcers Psychiatry: Judgement and insight appear normal. Mood & affect appropriate.       Data Review:   Micro Results Recent Results (from the past 240 hour(s))  Respiratory Panel by RT PCR (Flu A&B, Covid) - Nasopharyngeal Swab     Status: None   Collection Time: 08/27/20 12:26 PM   Specimen: Nasopharyngeal Swab  Result Value Ref Range Status   SARS Coronavirus 2 by RT PCR NEGATIVE NEGATIVE Final    Comment: (NOTE) SARS-CoV-2 target nucleic acids are NOT DETECTED.  The SARS-CoV-2 RNA is generally detectable in upper respiratoy specimens during the acute phase of infection. The lowest concentration of SARS-CoV-2 viral copies this assay can detect is 131 copies/mL. A negative result does not preclude SARS-Cov-2 infection and should not be used as the sole basis for treatment or other patient management decisions. A negative result may occur with  improper specimen collection/handling, submission of specimen other than nasopharyngeal swab, presence of viral mutation(s) within the areas targeted by this assay, and inadequate number of viral copies (<131 copies/mL). A negative result must be combined with clinical observations, patient history, and epidemiological information. The expected result is Negative.  Fact Sheet for Patients:  PinkCheek.be  Fact Sheet for Healthcare Providers:  GravelBags.it  This test is no t yet  approved or cleared by the [place] FDA and  has been authorized for detection and/or diagnosis of SARS-CoV-2 by FDA under an Emergency Use Authorization (EUA). This EUA will remain  in effect (meaning this test can be used) for the duration of the COVID-19 declaration under Section 564(b)(1) of the Act, 21 U.S.C. section 360bbb-3(b)(1), unless the authorization is terminated or revoked sooner.     Influenza A by PCR NEGATIVE NEGATIVE Final   Influenza B by PCR NEGATIVE NEGATIVE Final    Comment: (NOTE) The Xpert Xpress SARS-CoV-2/FLU/RSV assay is intended as an aid in  the diagnosis of influenza from Nasopharyngeal swab specimens and  should not be used as a sole basis for treatment. Nasal washings and  aspirates are unacceptable for Xpert Xpress SARS-CoV-2/FLU/RSV  testing.  Fact Sheet for Patients: PinkCheek.be  Fact  Sheet for Healthcare Providers: GravelBags.it  This test is not yet approved or cleared by the [place] and  has been authorized for detection and/or diagnosis of SARS-CoV-2 by  FDA under an Emergency Use Authorization (EUA). This EUA will remain  in effect (meaning this test can be used) for the duration of the  Covid-19 declaration under Section 564(b)(1) of the Act, 21  U.S.C. section 360bbb-3(b)(1), unless the authorization is  terminated or revoked. Performed at [facility], [address], [place], [place] [address]     Radiology Reports CT Abdomen Pelvis W Contrast  Result Date: 08/27/2020 CLINICAL DATA:  Acute left-sided abdominal pain. EXAM: CT ABDOMEN AND PELVIS WITH CONTRAST TECHNIQUE: Multidetector CT imaging of the abdomen and pelvis was performed using the standard protocol following bolus administration of intravenous contrast. CONTRAST:  33mL OMNIPAQUE IOHEXOL 300 MG/ML  SOLN COMPARISON:  August 26, 2020. FINDINGS: Lower chest: No acute abnormality. Hepatobiliary: No focal  liver abnormality is seen. No gallstones, gallbladder wall thickening, or biliary dilatation. Pancreas: Unremarkable. No pancreatic ductal dilatation or surrounding inflammatory changes. Spleen: Normal in size without focal abnormality. Adrenals/Urinary Tract: Adrenal glands are unremarkable. Kidneys are normal, without renal calculi, focal lesion, or hydronephrosis. Bladder is unremarkable. Stomach/Bowel: The stomach appears normal. There is significantly increased small bowel dilatation with transition zone seen in the left lower quadrant, best noted on image number 36 of series 5. This is concerning for adhesion. Stool is noted throughout the colon. No colonic dilatation is noted. The appendix is not visualized. Vascular/Lymphatic: Aortic atherosclerosis. No enlarged abdominal or pelvic lymph nodes. Reproductive: Status post hysterectomy. No adnexal masses. Other: Mild amount of free fluid is noted in the dependent portion the pelvis which is increased compared to prior exam. No definite hernia is noted. Musculoskeletal: No acute or significant osseous findings. IMPRESSION: 1. There is significantly increased small bowel dilatation consistent with obstruction with transition zone seen in the left lower quadrant, concerning for adhesion. Mild amount of free fluid is noted in the dependent portion of the pelvis which is increased compared to prior exam. 2. Aortic atherosclerosis. Aortic Atherosclerosis (ICD10-I70.0). Electronically Signed   By: Marijo Conception M.D.   On: 08/27/2020 14:26   CT Abdomen Pelvis W Contrast  Result Date: 08/26/2020 CLINICAL DATA:  Lower abdominal pain. EXAM: CT ABDOMEN AND PELVIS WITH CONTRAST TECHNIQUE: Multidetector CT imaging of the abdomen and pelvis was performed using the standard protocol following bolus administration of intravenous contrast. CONTRAST:  1mL OMNIPAQUE IOHEXOL 300 MG/ML  SOLN COMPARISON:  None. FINDINGS: Lower chest: Mild bibasilar atelectasis. No  consolidation or pleural effusions. Hepatobiliary: No focal liver abnormality is seen. No gallstones, gallbladder wall thickening, or biliary dilatation. Pancreas: Unremarkable. No pancreatic ductal dilatation or surrounding inflammatory changes. Spleen: Normal in size without focal abnormality. Adrenals/Urinary Tract: Adrenal glands are unremarkable. Kidneys are normal, without renal calculi, focal lesion, or hydronephrosis. Bladder is unremarkable. Stomach/Bowel: The duodenum does not cross midline with jejunum in the right abdomen, compatible with mild rotation. No evidence of bowel obstruction. Mild wall thickening and mucosal hyperenhancement of small bowel loops in the left lower quadrant with a small amount of surrounding fluid, concerning for enteritis. Vascular/Lymphatic: No significant vascular findings are present. No enlarged abdominal or pelvic lymph nodes. Aorta bi-iliac atherosclerosis. Reproductive: Status post hysterectomy. Other: Small volume of free fluid in the anatomic pelvis. No evidence of free air. Musculoskeletal: Similar remote T11 compression fracture without progressive height loss. Osteopenia. IMPRESSION: 1. Mild inflammatory change of small  bowel loops in the left lower quadrant with a small amount of surrounding fluid, concerning for enteritis that may be infectious, inflammatory, or ischemic. 2. No evidence of obstruction. 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 Electronically Signed   By: Margaretha Sheffield MD   On: 08/26/2020 09:55   DG Abd Portable 1V  Result Date: 08/28/2020 CLINICAL DATA:  Abdominal pain. EXAM: PORTABLE ABDOMEN - 1 VIEW COMPARISON:  Prior study same day. FINDINGS: NG tube noted coiled stomach. Dilated loops of small bowel again noted. Contrast again noted throughout the small bowel without significant interim movement. No significant amount of contrast in the colon. No free air. IMPRESSION: 1. NG tube noted coiled in the stomach. 2.  Dilated loops of small bowel again noted. Contrast again noted throughout the small bowel without significant interim movement. No significant amount of contrast in the colon. Electronically Signed   By: Marcello Moores  Register   On: 08/28/2020 07:43   DG Abd Portable 1V-Small Bowel Obstruction Protocol-initial, 8 hr delay  Result Date: 08/28/2020 CLINICAL DATA:  8 hour delay small-bowel obstruction EXAM: PORTABLE ABDOMEN - 1 VIEW COMPARISON:  08/27/2020 FINDINGS: There is contrast material within multiple loops of small bowel in the pelvis but no definite contrast in the colon. Small bowel remains dilated. IMPRESSION: Contrast material in the distal small bowel but none visualized within the colon. Electronically Signed   By: Ulyses Jarred M.D.   On: 08/28/2020 02:22   DG Abd Portable 1V-Small Bowel Protocol-Position Verification  Result Date: 08/27/2020 CLINICAL DATA:  NG tube placement EXAM: PORTABLE ABDOMEN - 1 VIEW COMPARISON:  None. FINDINGS: NG tube within stomach. Tip is in the gastric antrum. Side port within the stomach. Mildly dilated loops of small bowel measuring up to 2.9 cm. IMPRESSION: NG tube in good position. Electronically Signed   By: Suzy Bouchard M.D.   On: 08/27/2020 17:48     CBC Recent Labs  Lab 08/26/20 0814 08/27/20 1200 08/28/20 0514  WBC 6.9 17.0* 11.5*  HGB 14.0 16.0* 14.6  HCT 43.8 48.4* 46.6*  PLT 318 338 338  MCV 98.0 97.6 100.0  MCH 31.3 32.3 31.3  MCHC 32.0 33.1 31.3  RDW 12.5 13.0 13.2  LYMPHSABS  --  1.1 0.8  MONOABS  --  1.0 0.8  EOSABS  --  0.0 0.0  BASOSABS  --  0.1 0.0    Chemistries  Recent Labs  Lab 08/26/20 0814 08/27/20 1200 08/28/20 0514 08/30/20 0622  NA 138 134* 139 139  K 4.0 4.3 3.8 3.5  CL 105 99 103 105  CO2 26 22 22 28   GLUCOSE 97 139* 116* 99  BUN 17 26* 27* 22  CREATININE 0.81 1.08* 0.94 0.59  CALCIUM 9.1 9.5 9.0 8.1*  AST 21  --   --   --   ALT 18  --   --   --   ALKPHOS 77  --   --   --   BILITOT 0.7  --   --   --     ------------------------------------------------------------------------------------------------------------------ No results for input(s): CHOL, HDL, LDLCALC, TRIG, CHOLHDL, LDLDIRECT in the last 72 hours.  Lab Results  Component Value Date   HGBA1C 5.5 09/18/2016   ------------------------------------------------------------------------------------------------------------------ No results for input(s): TSH, T4TOTAL, T3FREE, THYROIDAB in the last 72 hours.  Invalid input(s): FREET3 ------------------------------------------------------------------------------------------------------------------ No results for input(s): VITAMINB12, FOLATE, FERRITIN, TIBC, IRON, RETICCTPCT in the last 72 hours.  Coagulation profile No results for input(s): INR, PROTIME in the  last 168 hours.  No results for input(s): DDIMER in the last 72 hours.  Cardiac Enzymes No results for input(s): CKMB, TROPONINI, MYOGLOBIN in the last 168 hours.  Invalid input(s): CK ------------------------------------------------------------------------------------------------------------------ No results found for: BNP   Kathie Dike M.D on 08/31/2020 at 6:45 PM  Go to [email or web address] - for contact info  [facility] - Office  [phone]

## 2020-08-31 NOTE — Progress Notes (Addendum)
I was present with the medical student for this service. I personally verified the history of present illness, performed the physical exam, and made the plan for this encounter. I have verified the medical student's documentation and made modifications where appropriately. I have personally documented in my own words a brief history, physical, and plan below.     Soft and having bowel function. Dressing c/d/i without erythema or drainage Roxi added and colace  Hopefully home in next 24-48 hours.  Curlene Labrum, MD [facility] [place], [place] [phone] (office)    [facility] Progress Note  3 Days Post-Op  Subjective: Pt doing well this AM w/ no nausea or vomiting. Reports well-controlled incisional pain and minimal appetite. Reports flatus and 1 BM yesterday. Walked yesterday and has not used spirometer.  Objective: Vital signs in last 24 hours: Temp:  [98.1 F (36.7 C)-99.4 F (37.4 C)] 98.5 F (36.9 C) (11/01 0544) Pulse Rate:  [68-82] 79 (11/01 0544) Resp:  [16-18] 18 (11/01 0544) BP: (119-165)/(64-82) 154/79 (11/01 0544) SpO2:  [93 %-95 %] 93 % (11/01 0544)    Intake/Output from previous day: 10/31 0701 - 11/01 0700 In: 120 [P.O.:120] Out: -  Intake/Output this shift: No intake/output data recorded.  General appearance: alert, cooperative and no distress Resp: Normal WOB Cardio: regular rate and rhythm, S1, S2 normal, no murmur, click, rub or gallop GI: soft, non-tender; bowel sounds normal; no masses,  no organomegaly Incision/Wound:c/d/i  Lab Results:  No results for input(s): WBC, HGB, HCT, PLT in the last 72 hours. BMET Recent Labs    08/30/20 0622  NA 139  K 3.5  CL 105  CO2 28  GLUCOSE 99  BUN 22  CREATININE 0.59  CALCIUM 8.1*   PT/INR No results for input(s): LABPROT, INR in the last 72 hours.  Studies/Results: No results  found.  Anti-infectives: Anti-infectives (From admission, onward)   Start     Dose/Rate Route Frequency Ordered Stop   08/28/20 1145  cefoTEtan (CEFOTAN) 2 g in sodium chloride 0.9 % 100 mL IVPB        2 g 200 mL/hr over 30 Minutes Intravenous On call to O.R. 08/28/20 1050 08/28/20 1303      Assessment/Plan: s/p Procedure(s): EXPLORATORY LAPAROTOMY LYSIS OF ADHESIONS  LOS: 4 days    Pt doing well on post-op day 3 s/p laparotomy and lysis of adhesions w/ pain well-controlled, liquids tolerated, flatus and BM reported, reassuring physical exam, and stable labs. Plan as follows:   - Encourage spirometer use and walking as tolerated - Advance diet as tolerated - Rec stool softener, PO narcotics in advance of d/c  Raliegh Ip 08/31/2020

## 2020-08-31 NOTE — Care Management Important Message (Signed)
Important Message  Patient Details  Name: Megan Conley MRN: [id number] Date of Birth: May 23, 1936   Medicare Important Message Given:  Yes     Tommy Medal 08/31/2020, 1:58 PM

## 2020-09-01 MED ORDER — DOCUSATE SODIUM 100 MG PO CAPS
100.0000 mg | ORAL_CAPSULE | Freq: Two times a day (BID) | ORAL | 0 refills | Status: DC
Start: 2020-09-01 — End: 2023-08-24

## 2020-09-01 MED ORDER — POLYETHYLENE GLYCOL 3350 17 G PO PACK: 17.0000 g | PACK | Freq: Every day | ORAL | 0 refills | Status: AC | PRN

## 2020-09-01 NOTE — Discharge Instructions (Signed)
Discharge Open Abdominal Surgery Instructions:  Common Complaints: Pain at the incision site is common. This will improve with time. Take your pain medications as described below. Some nausea is common and poor appetite. The main goal is to stay hydrated the first few days after surgery.   Diet/ Activity: Diet as tolerated. You have started and tolerated a diet in the hospital, and should continue to increase what you are able to eat.   You may not have a large appetite, but it is important to stay hydrated. Drink 64 ounces of water a day. Your appetite will return with time.  Keep a dry dressing in place over your staples daily or as needed. Some minor pink/ blood tinged drainage is expected. This will stop in a few days after surgery.  Shower per your regular routine daily.  Do not take hot showers. Take warm showers that are less than 10 minutes. Path the incision dry. Wear an abdominal binder daily with activity. You do not have to wear this while sleeping or sitting.  Rest and listen to your body, but do not remain in bed all day.  Walk everyday for at least 15-20 minutes. Deep cough and move around every 1-2 hours in the first few days after surgery.  Do not lift > 10 lbs, perform excessive bending, pushing, pulling, squatting for 6-8 weeks after surgery.  The activity restrictions and the abdominal binder are to prevent hernia formation at your incision while you are healing.  Do not place lotions or balms on your incision unless instructed to specifically by Dr. Constance Haw.   Pain Expectations and Narcotics: -After surgery you will have pain associated with your incisions and this is normal. The pain is muscular and nerve pain, and will get better with time. -You are encouraged and expected to take non narcotic medications like tylenol and ibuprofen (when able) to treat pain as multiple modalities can aid with pain treatment. -Narcotics are only used when pain is severe or there is  breakthrough pain. -You are not expected to have a pain score of 0 after surgery, as we cannot prevent pain. A pain score of 3-4 that allows you to be functional, move, walk, and tolerate some activity is the goal. The pain will continue to improve over the days after surgery and is dependent on your surgery. -Due to [place] law, we are only able to give a certain amount of pain medication to treat post operative pain, and we only give additional narcotics on a patient by patient basis.  -For most laparoscopic surgery, studies have shown that the majority of patients only need 10-15 narcotic pills, and for open surgeries most patients only need 15-20.   -Having appropriate expectations of pain and knowledge of pain management with non narcotics is important as we do not want anyone to become addicted to narcotic pain medication.  -Using ice packs in the first 48 hours and heating pads after 48 hours, wearing an abdominal binder (when recommended), and using over the counter medications are all ways to help with pain management.   -Simple acts like meditation and mindfulness practices after surgery can also help with pain control and research has proven the benefit of these practices.  Medication: Take tylenol and ibuprofen as needed for pain control, alternating every 4-6 hours.  Example:  Tylenol 1000mg  @ 6am, 12noon, 6pm, 6midnight (Do not exceed 4000mg  of tylenol a day). Ibuprofen 800mg  @ 9am, 3pm, 9pm, 3am (Do not exceed 3600mg  of ibuprofen a day).  Take Roxicodone for breakthrough pain every 4 hours.  Take Colace for constipation related to narcotic pain medication. If you do not have a bowel movement in 2 days, take Miralax over the counter.  Drink plenty of water to also prevent constipation.   Contact Information: If you have questions or concerns, please call our office, [phone], Monday- Thursday 8AM-5PM and Friday 8AM-12Noon.  If it is after hours or on the weekend, please call [facility]  Main Number, [phone], and ask to speak to the surgeon on call for Dr. Constance Haw at [facility]    Exploratory Laparotomy, Adult, Care After This sheet gives you information about how to care for yourself after your procedure. Your health care provider may also give you more specific instructions. If you have problems or questions, contact your health care provider. What can I expect after the procedure? After the procedure, it is common to have:  Abdominal soreness.  Fatigue.  A sore throat from the tube in your throat.  A lack of appetite. Follow these instructions at home: Medicines  Take over-the-counter and prescription medicines only as told by your health care provider.  If you were prescribed an antibiotic medicine, take it as told by your health care provider. Do not stop taking the antibiotic even if you start to feel better.  Do not drive or operate heavy machinery while taking pain medicine.  If you are taking prescription pain medicine, take actions to prevent or treat constipation. Your health care provider may recommend that you: ? Drink enough fluid to keep your urine pale yellow. ? Eat foods that are high in fiber, such as fresh fruits and vegetables, whole grains, and beans. ? Limit foods that are high in fat and processed sugars, such as fried or sweet foods. ? Take an over-the-counter or prescription medicine for constipation. Undergoing surgery and taking pain medicines can make constipation worse. Incision care   Follow instructions from your health care provider about how to take care of your incision. Make sure you: ? Wash your hands with soap and water before you change your bandage (dressing). If soap and water are not available, use hand sanitizer. ? Change your dressing as told by your health care provider. ? Leave stitches (sutures), skin glue, or adhesive strips in place. These skin closures may need to stay in place for 2 weeks or longer. If  adhesive strip edges start to loosen and curl up, you may trim the loose edges. Do not remove adhesive strips completely unless your health care provider tells you to do that.  If you were sent home with a drain, follow instructions from your health care provider about how to care for it.  Check your incision area every day for signs of infection. Check for: ? Redness, swelling, or pain. ? Fluid or blood. ? Warmth. ? Pus or a bad smell. Activity  Rest as told by your health care provider. ? Avoid sitting for a long time without moving. Get up to take short walks every 1-2 hours. This is important to improve blood flow and breathing. Ask for help if you feel weak or unsteady.  Do not lift anything that is heavier than 10 lb (2.2 kg), or the limit that your health care provider tells you, until he or she says that it is safe.  Ask your health care provider when you can start to do your usual activities again, such as driving, going back to work, and having sex. Eating and drinking  You  may eat what you usually eat. Include lots of whole grains, fruits, and vegetables in your diet. This will help to prevent constipation.  Drink enough fluid to keep your urine pale yellow. Bathing  Keep your incision clean and dry. Clean it as often as told by your health care provider: ? Gently wash the incision with soap and water. ? Rinse the incision with water to remove all soap. ? Pat the incision dry with a clean towel. Do not rub the incision.  You may shower.   Do not take baths, swim, or use a hot tub until your health care provider says it is okay to do so. General instructions  Do not use any products that contain nicotine or tobacco, such as cigarettes and e-cigarettes. These can delay healing after surgery. If you need help quitting, ask your health care provider.  Wear compression stockings as told by your health care provider. These stockings help to prevent blood clots and reduce  swelling in your legs.  Keep all follow-up visits as told by your health care provider. This is important. Contact a health care provider if:  You have a fever.  You have chills.  Your pain medicine is not helping.  You have constipation or diarrhea.  You have nausea or vomiting.  You have drainage, redness, swelling, or pain at your incision site. Get help right away if:  Your pain is getting worse.  You have not had a bowel movement for more than 3 days.  You have ongoing (persistent) vomiting.  The edges of your incision open up.  You have warmth, tenderness, and swelling in your calf.  You have trouble breathing.  You have chest pain. These symptoms may represent a serious problem that is an emergency. Do not wait to see if the symptoms will go away. Get medical help right away. Call your local emergency services (911 in the [place]). Do not drive yourself to the hospital. Summary  Abdominal soreness is common after exploratory laparotomy. Take over-the-counter and prescription pain medicines only as told by your health care provider.  Follow instructions from your health care provider about how to take care of your incision. Do not take baths, swim, or use a hot tub until your health care provider says it is okay to do so.  Watch for signs and symptoms of infection after surgery, including fever, chills, drainage from your incision, and worsening abdominal pain. This information is not intended to replace advice given to you by your health care provider. Make sure you discuss any questions you have with your health care provider. Document Revised: 12/10/2018 Document Reviewed: 10/27/2017 Elsevier Patient Education  2020 [employer].

## 2020-09-01 NOTE — Progress Notes (Signed)
I was present with the medical student for this service. I personally verified the history of present illness, performed the physical exam, and made the plan for this encounter. I have verified the medical student's documentation and made modifications where appropriately. I have personally documented in my own words a brief history, physical, and plan below.     Looking good overall. Incisions healing with staples. Dressing removed. Diet as tolerated. BMs.   Home and follow up next week for staple removal.  Curlene Labrum, MD [facility] [place],  [phone] (office)     [facility] Progress Note  4 Days Post-Op  Subjective: Pt doing well w/ no ON nights. Tolerating food w/out increase in pain. Reports continued flatus, 2 loose stools yesterday.  Objective: Vital signs in last 24 hours: Temp:  [97.6 F (36.4 C)-99.3 F (37.4 C)] 98.3 F (36.8 C) (11/02 0610) Pulse Rate:  [67-84] 84 (11/02 0610) Resp:  [16-17] 17 (11/02 0610) BP: (115-149)/(63-80) 129/76 (11/02 0610) SpO2:  [94 %-96 %] 94 % (11/02 0610) Last BM Date: 08/31/20  Intake/Output from previous day: No intake/output data recorded. Intake/Output this shift: No intake/output data recorded.  General appearance: alert, cooperative and no distress Head: Normocephalic, without obvious abnormality, atraumatic Resp: clear to auscultation bilaterally Cardio: regular rate and rhythm, S1, S2 normal, no murmur, click, rub or gallop GI: soft, non-tender; bowel sounds normal; no masses,  no organomegaly Incision/Wound:c/d/i  Lab Results:  No results for input(s): WBC, HGB, HCT, PLT in the last 72 hours. BMET Recent Labs    08/30/20 0622  NA 139  K 3.5  CL 105  CO2 28  GLUCOSE 99  BUN 22  CREATININE 0.59  CALCIUM 8.1*   PT/INR No results for input(s): LABPROT, INR in the last 72 hours.  Studies/Results: No results  found.  Anti-infectives: Anti-infectives (From admission, onward)   Start     Dose/Rate Route Frequency Ordered Stop   08/28/20 1145  cefoTEtan (CEFOTAN) 2 g in sodium chloride 0.9 % 100 mL IVPB        2 g 200 mL/hr over 30 Minutes Intravenous On call to O.R. 08/28/20 1050 08/28/20 1303      Assessment/Plan: s/p Procedure(s): EXPLORATORY LAPAROTOMY LYSIS OF ADHESIONS PT doing well clinically w/ resumption of bowel function and no new labs or imaging.   Plan as follows:  - D/c today. - Return precautions given. - Communicated to patient that she does not need to take stool softeners if stools are loose. Encouraged continued walking, spirometer use. - F/u 1 week in clinic for staple removal    LOS: 5 days    Raliegh Ip 09/01/2020

## 2020-09-01 NOTE — Discharge Summary (Signed)
Physician Discharge Summary  Megan Conley QIW:[id number] DOB: 1936/06/28 DOA: 08/27/2020  PCP: Celene Squibb, MD  Admit date: 08/27/2020 Discharge date: 09/01/2020  Admitted From: Home Disposition: Home  Recommendations for Outpatient Follow-up:  1. Follow up with PCP in 1-2 weeks 2. Please obtain BMP/CBC in one week 3. Follow-up with general surgery as an outpatient  Home Health: Home health PT, RN Equipment/Devices:  Discharge Condition: Stable CODE STATUS: Full code Diet recommendation: Heart healthy  Brief/Interim Summary: 84 y.o.female,withhistory of hypothyroidism, history of previous small bowel obstruction in 2017 resolved without surgical intervention, admitted on 08/27/2020 with an episode of SBO with AKI and leukocytosis  Discharge Diagnoses:  Principal Problem:   SBO (small bowel obstruction) ([place]) Active Problems:   Hypothyroidism   Elevated blood pressure reading without diagnosis of hypertension  1)SBO-failed conservative measures with NG tube, underwent  lysis of adhesions on 08/28/2020 with Dr. Constance Haw, -NG tube discontinued on 10/31 -Patient is starting to have bowel movements -Diet was advanced to solid food and she tolerated this well -She is being discharged home with surgery follow-up  2) leukocytosis--most likely reactive from #1 above -WBC is down to 11.5 from 17.0 on admission  3) hypothyroidism--resume Synthroid on discharge  4)AKI-due to dehydration SBO,  -improved with hydration -Baseline creatinine usually around 0.6-0.8  Discharge Instructions  Discharge Instructions    Diet - low sodium heart healthy   Complete by: As directed    Discharge wound care:   Complete by: As directed    Keep incisions clean and dry   Increase activity slowly   Complete by: As directed      Allergies as of 09/01/2020   No Known Allergies     Medication List    TAKE these medications   Alphagan P 0.1 % Soln Generic drug:  brimonidine Apply 1 drop to eye daily.   Biotin 5000 MCG Caps Take 5,000 mcg by mouth daily.   CALCIUM 1200+D3 PO Take 1 tablet by mouth daily.   docusate sodium 100 MG capsule Commonly known as: COLACE Take 1 capsule (100 mg total) by mouth 2 (two) times daily.   Fish Oil 1200 MG Caps Take 1,200 mg by mouth 2 (two) times daily.   folic acid 417 MCG tablet Commonly known as: FOLVITE Take 800 mcg by mouth daily.   HYDROcodone-acetaminophen 5-325 MG tablet Commonly known as: NORCO/VICODIN Take 1 tablet by mouth every 6 (six) hours as needed for moderate pain.   latanoprost 0.005 % ophthalmic solution Commonly known as: XALATAN Place 1 drop into both eyes at bedtime.   levothyroxine 50 MCG tablet Commonly known as: SYNTHROID Take 50 mcg by mouth daily before breakfast.   multivitamin with minerals Tabs tablet Take 1 tablet by mouth daily.   polyethylene glycol 17 g packet Commonly known as: MiraLax Take 17 g by mouth daily as needed.   Red Yeast Rice 600 MG Caps Take 1,200 mg by mouth every evening.            Discharge Care Instructions  (From admission, onward)         Start     Ordered   09/01/20 0000  Discharge wound care:       Comments: Keep incisions clean and dry   09/01/20 4081          Follow-up Information    Virl Cagey, MD On 09/10/2020.   Specialty: General Surgery Why: staple removal, wound check  Contact information: [address] [facility] [place]  [facility] [place] Follow up.   Why: [facility] staff will call you to schedule in home visits from nurse and physical therapy Contact information: [place] [phone]             No Known Allergies  Consultations:  General surgery   Procedures/Studies: CT Abdomen Pelvis W Contrast  Result Date: 08/27/2020 CLINICAL DATA:  Acute left-sided abdominal pain. EXAM: CT ABDOMEN  AND PELVIS WITH CONTRAST TECHNIQUE: Multidetector CT imaging of the abdomen and pelvis was performed using the standard protocol following bolus administration of intravenous contrast. CONTRAST:  33mL OMNIPAQUE IOHEXOL 300 MG/ML  SOLN COMPARISON:  August 26, 2020. FINDINGS: Lower chest: No acute abnormality. Hepatobiliary: No focal liver abnormality is seen. No gallstones, gallbladder wall thickening, or biliary dilatation. Pancreas: Unremarkable. No pancreatic ductal dilatation or surrounding inflammatory changes. Spleen: Normal in size without focal abnormality. Adrenals/Urinary Tract: Adrenal glands are unremarkable. Kidneys are normal, without renal calculi, focal lesion, or hydronephrosis. Bladder is unremarkable. Stomach/Bowel: The stomach appears normal. There is significantly increased small bowel dilatation with transition zone seen in the left lower quadrant, best noted on image number 36 of series 5. This is concerning for adhesion. Stool is noted throughout the colon. No colonic dilatation is noted. The appendix is not visualized. Vascular/Lymphatic: Aortic atherosclerosis. No enlarged abdominal or pelvic lymph nodes. Reproductive: Status post hysterectomy. No adnexal masses. Other: Mild amount of free fluid is noted in the dependent portion the pelvis which is increased compared to prior exam. No definite hernia is noted. Musculoskeletal: No acute or significant osseous findings. IMPRESSION: 1. There is significantly increased small bowel dilatation consistent with obstruction with transition zone seen in the left lower quadrant, concerning for adhesion. Mild amount of free fluid is noted in the dependent portion of the pelvis which is increased compared to prior exam. 2. Aortic atherosclerosis. Aortic Atherosclerosis (ICD10-I70.0). Electronically Signed   By: Marijo Conception M.D.   On: 08/27/2020 14:26   CT Abdomen Pelvis W Contrast  Result Date: 08/26/2020 CLINICAL DATA:  Lower abdominal pain.  EXAM: CT ABDOMEN AND PELVIS WITH CONTRAST TECHNIQUE: Multidetector CT imaging of the abdomen and pelvis was performed using the standard protocol following bolus administration of intravenous contrast. CONTRAST:  78mL OMNIPAQUE IOHEXOL 300 MG/ML  SOLN COMPARISON:  None. FINDINGS: Lower chest: Mild bibasilar atelectasis. No consolidation or pleural effusions. Hepatobiliary: No focal liver abnormality is seen. No gallstones, gallbladder wall thickening, or biliary dilatation. Pancreas: Unremarkable. No pancreatic ductal dilatation or surrounding inflammatory changes. Spleen: Normal in size without focal abnormality. Adrenals/Urinary Tract: Adrenal glands are unremarkable. Kidneys are normal, without renal calculi, focal lesion, or hydronephrosis. Bladder is unremarkable. Stomach/Bowel: The duodenum does not cross midline with jejunum in the right abdomen, compatible with mild rotation. No evidence of bowel obstruction. Mild wall thickening and mucosal hyperenhancement of small bowel loops in the left lower quadrant with a small amount of surrounding fluid, concerning for enteritis. Vascular/Lymphatic: No significant vascular findings are present. No enlarged abdominal or pelvic lymph nodes. Aorta bi-iliac atherosclerosis. Reproductive: Status post hysterectomy. Other: Small volume of free fluid in the anatomic pelvis. No evidence of free air. Musculoskeletal: Similar remote T11 compression fracture without progressive height loss. Osteopenia. IMPRESSION: 1. Mild inflammatory change of small bowel loops in the left lower quadrant with a small amount of surrounding fluid, concerning for enteritis that may be infectious, inflammatory, or ischemic. 2. No evidence  of obstruction. 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 Electronically Signed   By: Margaretha Sheffield MD   On: 08/26/2020 09:55   DG Abd Portable 1V  Result Date: 08/28/2020 CLINICAL DATA:  Abdominal pain. EXAM: PORTABLE ABDOMEN - 1  VIEW COMPARISON:  Prior study same day. FINDINGS: NG tube noted coiled stomach. Dilated loops of small bowel again noted. Contrast again noted throughout the small bowel without significant interim movement. No significant amount of contrast in the colon. No free air. IMPRESSION: 1. NG tube noted coiled in the stomach. 2. Dilated loops of small bowel again noted. Contrast again noted throughout the small bowel without significant interim movement. No significant amount of contrast in the colon. Electronically Signed   By: Marcello Moores  Register   On: 08/28/2020 07:43   DG Abd Portable 1V-Small Bowel Obstruction Protocol-initial, 8 hr delay  Result Date: 08/28/2020 CLINICAL DATA:  8 hour delay small-bowel obstruction EXAM: PORTABLE ABDOMEN - 1 VIEW COMPARISON:  08/27/2020 FINDINGS: There is contrast material within multiple loops of small bowel in the pelvis but no definite contrast in the colon. Small bowel remains dilated. IMPRESSION: Contrast material in the distal small bowel but none visualized within the colon. Electronically Signed   By: Ulyses Jarred M.D.   On: 08/28/2020 02:22   DG Abd Portable 1V-Small Bowel Protocol-Position Verification  Result Date: 08/27/2020 CLINICAL DATA:  NG tube placement EXAM: PORTABLE ABDOMEN - 1 VIEW COMPARISON:  None. FINDINGS: NG tube within stomach. Tip is in the gastric antrum. Side port within the stomach. Mildly dilated loops of small bowel measuring up to 2.9 cm. IMPRESSION: NG tube in good position. Electronically Signed   By: Suzy Bouchard M.D.   On: 08/27/2020 17:48       Subjective: Feeling better.  No abdominal pain.  Tolerating solid diet  Discharge Exam: Vitals:   08/31/20 0544 08/31/20 1436 08/31/20 2056 09/01/20 0610  BP: (!) 154/79 115/63 (!) 149/80 129/76  Pulse: 79 67 75 84  Resp: 18 16  17   Temp: 98.5 F (36.9 C) 97.6 F (36.4 C) 99.3 F (37.4 C) 98.3 F (36.8 C)  TempSrc: Oral Oral Oral Oral  SpO2: 93% 96% 95% 94%  Weight:       Height:        General: Pt is alert, awake, not in acute distress Cardiovascular: RRR, S1/S2 +, no rubs, no gallops Respiratory: CTA bilaterally, no wheezing, no rhonchi Abdominal: Soft, NT, ND, bowel sounds + Extremities: no edema, no cyanosis    The results of significant diagnostics from this hospitalization (including imaging, microbiology, ancillary and laboratory) are listed below for reference.     Microbiology: Recent Results (from the past 240 hour(s))  Respiratory Panel by RT PCR (Flu A&B, Covid) - Nasopharyngeal Swab     Status: None   Collection Time: 08/27/20 12:26 PM   Specimen: Nasopharyngeal Swab  Result Value Ref Range Status   SARS Coronavirus 2 by RT PCR NEGATIVE NEGATIVE Final    Comment: (NOTE) SARS-CoV-2 target nucleic acids are NOT DETECTED.  The SARS-CoV-2 RNA is generally detectable in upper respiratoy specimens during the acute phase of infection. The lowest concentration of SARS-CoV-2 viral copies this assay can detect is 131 copies/mL. A negative result does not preclude SARS-Cov-2 infection and should not be used as the sole basis for treatment or other patient management decisions. A negative result may occur with  improper specimen collection/handling, submission of specimen other than nasopharyngeal swab, presence of viral  mutation(s) within the areas targeted by this assay, and inadequate number of viral copies (<131 copies/mL). A negative result must be combined with clinical observations, patient history, and epidemiological information. The expected result is Negative.  Fact Sheet for Patients:  PinkCheek.be  Fact Sheet for Healthcare Providers:  GravelBags.it  This test is no t yet approved or cleared by the [place] FDA and  has been authorized for detection and/or diagnosis of SARS-CoV-2 by FDA under an Emergency Use Authorization (EUA). This EUA will remain  in effect  (meaning this test can be used) for the duration of the COVID-19 declaration under Section 564(b)(1) of the Act, 21 U.S.C. section 360bbb-3(b)(1), unless the authorization is terminated or revoked sooner.     Influenza A by PCR NEGATIVE NEGATIVE Final   Influenza B by PCR NEGATIVE NEGATIVE Final    Comment: (NOTE) The Xpert Xpress SARS-CoV-2/FLU/RSV assay is intended as an aid in  the diagnosis of influenza from Nasopharyngeal swab specimens and  should not be used as a sole basis for treatment. Nasal washings and  aspirates are unacceptable for Xpert Xpress SARS-CoV-2/FLU/RSV  testing.  Fact Sheet for Patients: PinkCheek.be  Fact Sheet for Healthcare Providers: GravelBags.it  This test is not yet approved or cleared by the [place] FDA and  has been authorized for detection and/or diagnosis of SARS-CoV-2 by  FDA under an Emergency Use Authorization (EUA). This EUA will remain  in effect (meaning this test can be used) for the duration of the  Covid-19 declaration under Section 564(b)(1) of the Act, 21  U.S.C. section 360bbb-3(b)(1), unless the authorization is  terminated or revoked. Performed at [facility], [address], [place], [place] [address]      Labs: BNP (last 3 results) No results for input(s): BNP in the last 8760 hours. Basic Metabolic Panel: Recent Labs  Lab 08/26/20 0814 08/27/20 1200 08/28/20 0514 08/30/20 0622  NA 138 134* 139 139  K 4.0 4.3 3.8 3.5  CL 105 99 103 105  CO2 26 22 22 28   GLUCOSE 97 139* 116* 99  BUN 17 26* 27* 22  CREATININE 0.81 1.08* 0.94 0.59  CALCIUM 9.1 9.5 9.0 8.1*   Liver Function Tests: Recent Labs  Lab 08/26/20 0814  AST 21  ALT 18  ALKPHOS 77  BILITOT 0.7  PROT 6.8  ALBUMIN 3.7   Recent Labs  Lab 08/26/20 0814  LIPASE 29   No results for input(s): AMMONIA in the last 168 hours. CBC: Recent Labs  Lab 08/26/20 0814 08/27/20 1200  08/28/20 0514  WBC 6.9 17.0* 11.5*  NEUTROABS  --  14.9* 9.8*  HGB 14.0 16.0* 14.6  HCT 43.8 48.4* 46.6*  MCV 98.0 97.6 100.0  PLT 318 338 338   Cardiac Enzymes: No results for input(s): CKTOTAL, CKMB, CKMBINDEX, TROPONINI in the last 168 hours. BNP: Invalid input(s): POCBNP CBG: Recent Labs  Lab 08/30/20 0736 08/30/20 1146 08/30/20 1631 08/30/20 2330 08/31/20 0546  GLUCAP 108* 97 98 88 88   D-Dimer No results for input(s): DDIMER in the last 72 hours. Hgb A1c No results for input(s): HGBA1C in the last 72 hours. Lipid Profile No results for input(s): CHOL, HDL, LDLCALC, TRIG, CHOLHDL, LDLDIRECT in the last 72 hours. Thyroid function studies No results for input(s): TSH, T4TOTAL, T3FREE, THYROIDAB in the last 72 hours.  Invalid input(s): FREET3 Anemia work up No results for input(s): VITAMINB12, FOLATE, FERRITIN, TIBC, IRON, RETICCTPCT in the last 72 hours. Urinalysis    Component Value Date/Time  COLORURINE YELLOW 10/15/2017 1143   APPEARANCEUR HAZY (A) 10/15/2017 1143   LABSPEC 1.017 10/15/2017 1143   PHURINE 6.0 10/15/2017 1143   GLUCOSEU NEGATIVE 10/15/2017 1143   HGBUR NEGATIVE 10/15/2017 [place] 10/15/2017 1143   KETONESUR NEGATIVE 10/15/2017 1143   PROTEINUR NEGATIVE 10/15/2017 1143   NITRITE NEGATIVE 10/15/2017 1143   LEUKOCYTESUR NEGATIVE 10/15/2017 1143   Sepsis Labs Invalid input(s): PROCALCITONIN,  WBC,  LACTICIDVEN Microbiology Recent Results (from the past 240 hour(s))  Respiratory Panel by RT PCR (Flu A&B, Covid) - Nasopharyngeal Swab     Status: None   Collection Time: 08/27/20 12:26 PM   Specimen: Nasopharyngeal Swab  Result Value Ref Range Status   SARS Coronavirus 2 by RT PCR NEGATIVE NEGATIVE Final    Comment: (NOTE) SARS-CoV-2 target nucleic acids are NOT DETECTED.  The SARS-CoV-2 RNA is generally detectable in upper respiratoy specimens during the acute phase of infection. The lowest concentration of SARS-CoV-2  viral copies this assay can detect is 131 copies/mL. A negative result does not preclude SARS-Cov-2 infection and should not be used as the sole basis for treatment or other patient management decisions. A negative result may occur with  improper specimen collection/handling, submission of specimen other than nasopharyngeal swab, presence of viral mutation(s) within the areas targeted by this assay, and inadequate number of viral copies (<131 copies/mL). A negative result must be combined with clinical observations, patient history, and epidemiological information. The expected result is Negative.  Fact Sheet for Patients:  PinkCheek.be  Fact Sheet for Healthcare Providers:  GravelBags.it  This test is no t yet approved or cleared by the [place] FDA and  has been authorized for detection and/or diagnosis of SARS-CoV-2 by FDA under an Emergency Use Authorization (EUA). This EUA will remain  in effect (meaning this test can be used) for the duration of the COVID-19 declaration under Section 564(b)(1) of the Act, 21 U.S.C. section 360bbb-3(b)(1), unless the authorization is terminated or revoked sooner.     Influenza A by PCR NEGATIVE NEGATIVE Final   Influenza B by PCR NEGATIVE NEGATIVE Final    Comment: (NOTE) The Xpert Xpress SARS-CoV-2/FLU/RSV assay is intended as an aid in  the diagnosis of influenza from Nasopharyngeal swab specimens and  should not be used as a sole basis for treatment. Nasal washings and  aspirates are unacceptable for Xpert Xpress SARS-CoV-2/FLU/RSV  testing.  Fact Sheet for Patients: PinkCheek.be  Fact Sheet for Healthcare Providers: GravelBags.it  This test is not yet approved or cleared by the [place] FDA and  has been authorized for detection and/or diagnosis of SARS-CoV-2 by  FDA under an Emergency Use Authorization (EUA).  This EUA will remain  in effect (meaning this test can be used) for the duration of the  Covid-19 declaration under Section 564(b)(1) of the Act, 21  U.S.C. section 360bbb-3(b)(1), unless the authorization is  terminated or revoked. Performed at [facility], [address] [place], [place] [address]      Time coordinating discharge: 63mins  SIGNED:   Kathie Dike, MD  [facility] 09/01/2020, 9:31 PM   If 7PM-7AM, please contact night-coverage [email or web address]

## 2020-09-01 NOTE — TOC Transition Note (Signed)
Transition of Care [facility]) - CM/SW Discharge Note   Patient Details  Name: EMOGENE MURATALLA MRN: [id number] Date of Birth: 11/18/1935  [facility] CM/SW Contact:  Shade Flood, LCSW Phone Number: 09/01/2020, 10:43 AM   Clinical Narrative:     Pt stable for dc today per MD. Plan remains for dc home with [facility] to follow. Updated Linda at [place].   There are no other TOC needs identified for dc.  Final next level of care: [place] Barriers to Discharge: Barriers Resolved   Patient Goals and CMS Choice        Discharge Placement                       Discharge Plan and Services                          HH Arranged: RN, PT [facility] Agency: [place] ([facility]) Date [facility] Agency Contacted: 09/01/20   Representative spoke with at [place]: [place] ([place]) Interventions     Readmission Risk Interventions No flowsheet data found.

## 2020-09-01 NOTE — Plan of Care (Signed)

## 2020-09-01 NOTE — Progress Notes (Signed)
Physical Therapy Treatment Patient Details Name: Megan Conley MRN: [id number] DOB: 1936-05-20 Today's Date: 09/01/2020    History of Present Illness 84 y.o. female with PMH of hypothyroidism, previous small bowel obstruction in 2017 resolved without surgical intervention, currently with c/o abdominal pain, nausea, vomiting over last 24 hours, abdominal pain over last 48 hours, reports few episodes of vomiting, bilious, reports abdominal pain mainly in the left lower quadrant; s/p ex-lap and lusis of adhesions on 08/28/20    PT Comments    Pt rising from chair to go to restroom upon therapist's entrance into room. Pt able to safely navigate around furniture and complete toileting without physical assist. Pt ambulates in hallway with good steadiness, requests HHA from therapist, although denies feeling weak or unsteady. Pt reports ambulating in hallway with family yesterday without difficulty or concerns. Pt with good safety awareness and good family support, will sign off from acute PT services at this time; pt d/c to ambulate with nursing and family daily as tolerated for length of stay. Continue to recommend HHPT for continued strengthening and balance training at home.      Follow Up Recommendations  Home health PT;Supervision - Intermittent     Equipment Recommendations  None recommended by PT    Recommendations for Other Services       Precautions / Restrictions Precautions Precautions: None Restrictions Weight Bearing Restrictions: No    Mobility  Bed Mobility  General bed mobility comments: in chair upon arrival  Transfers Overall transfer level: Modified independent Equipment used: None Transfers: Sit to/from Stand Sit to Stand: Modified independent (Device/Increase time)  General transfer comment: STS from chair and toilet in restroom, light use of BUE to assist in powering up, good steadiness  Ambulation/Gait Ambulation/Gait assistance: Supervision Gait  Distance (Feet): 200 Feet Assistive device: 1 person hand held assist Gait Pattern/deviations: WFL(Within Functional Limits) Gait velocity: decreased   General Gait Details: pt requests HHA with ambulating in hallway, no LOB or unsteadiness noted, limited by "discomfort" in abdomen with gait   Stairs             Wheelchair Mobility    Modified Rankin (Stroke Patients Only)       Balance Overall balance assessment: No apparent balance deficits (not formally assessed)         Cognition Arousal/Alertness: Awake/alert Behavior During Therapy: WFL for tasks assessed/performed Overall Cognitive Status: Within Functional Limits for tasks assessed     Exercises      General Comments        Pertinent Vitals/Pain Pain Assessment: Faces Faces Pain Scale: Hurts a little bit Pain Location: abdomen with movement Pain Descriptors / Indicators: Discomfort Pain Intervention(s): Limited activity within patient's tolerance;Monitored during session    Home Living                      Prior Function            PT Goals (current goals can now be found in the care plan section) Acute Rehab PT Goals Patient Stated Goal: return home with family to assist PT Goal Formulation: With patient Time For Goal Achievement: 09/04/20 Potential to Achieve Goals: Good Progress towards PT goals: Goals met/education completed, patient discharged from PT    Frequency    Min 3X/week      PT Plan Current plan remains appropriate    Co-evaluation              AM-PAC PT "6 Clicks" Mobility  Outcome Measure  Help needed turning from your back to your side while in a flat bed without using bedrails?: None Help needed moving from lying on your back to sitting on the side of a flat bed without using bedrails?: A Little Help needed moving to and from a bed to a chair (including a wheelchair)?: None Help needed standing up from a chair using your arms (e.g., wheelchair or  bedside chair)?: None Help needed to walk in hospital room?: None Help needed climbing 3-5 steps with a railing? : A Little 6 Click Score: 22    End of Session   Activity Tolerance: Patient tolerated treatment well Patient left: in chair;with call bell/phone within reach Nurse Communication: Mobility status PT Visit Diagnosis: Unsteadiness on feet (R26.81);Other abnormalities of gait and mobility (R26.89);Muscle weakness (generalized) (M62.81)     Time: 9767-3419 PT Time Calculation (min) (ACUTE ONLY): 9 min  Charges:  $Gait Training: 8-22 mins                      Talbot Grumbling PT, DPT 09/01/20, 12:36 PM [phone]

## 2020-09-03 DIAGNOSIS — K56699 Other intestinal obstruction unspecified as to partial versus complete obstruction: Secondary | ICD-10-CM | POA: Diagnosis not present

## 2020-09-03 DIAGNOSIS — N179 Acute kidney failure, unspecified: Secondary | ICD-10-CM | POA: Diagnosis not present

## 2020-09-03 DIAGNOSIS — Z90710 Acquired absence of both cervix and uterus: Secondary | ICD-10-CM | POA: Diagnosis not present

## 2020-09-03 DIAGNOSIS — I10 Essential (primary) hypertension: Secondary | ICD-10-CM | POA: Diagnosis not present

## 2020-09-03 DIAGNOSIS — E039 Hypothyroidism, unspecified: Secondary | ICD-10-CM | POA: Diagnosis not present

## 2020-09-03 DIAGNOSIS — Z48815 Encounter for surgical aftercare following surgery on the digestive system: Secondary | ICD-10-CM | POA: Diagnosis not present

## 2020-09-03 DIAGNOSIS — Z9181 History of falling: Secondary | ICD-10-CM | POA: Diagnosis not present

## 2020-09-03 DIAGNOSIS — H409 Unspecified glaucoma: Secondary | ICD-10-CM | POA: Diagnosis not present

## 2020-09-08 DIAGNOSIS — E860 Dehydration: Secondary | ICD-10-CM | POA: Diagnosis not present

## 2020-09-08 DIAGNOSIS — E039 Hypothyroidism, unspecified: Secondary | ICD-10-CM | POA: Diagnosis not present

## 2020-09-08 DIAGNOSIS — H409 Unspecified glaucoma: Secondary | ICD-10-CM | POA: Diagnosis not present

## 2020-09-08 DIAGNOSIS — Z90710 Acquired absence of both cervix and uterus: Secondary | ICD-10-CM | POA: Diagnosis not present

## 2020-09-08 DIAGNOSIS — Z48815 Encounter for surgical aftercare following surgery on the digestive system: Secondary | ICD-10-CM | POA: Diagnosis not present

## 2020-09-08 DIAGNOSIS — I10 Essential (primary) hypertension: Secondary | ICD-10-CM | POA: Diagnosis not present

## 2020-09-08 DIAGNOSIS — A499 Bacterial infection, unspecified: Secondary | ICD-10-CM | POA: Diagnosis not present

## 2020-09-08 DIAGNOSIS — N179 Acute kidney failure, unspecified: Secondary | ICD-10-CM | POA: Diagnosis not present

## 2020-09-09 DIAGNOSIS — N179 Acute kidney failure, unspecified: Secondary | ICD-10-CM | POA: Diagnosis not present

## 2020-09-09 DIAGNOSIS — H409 Unspecified glaucoma: Secondary | ICD-10-CM | POA: Diagnosis not present

## 2020-09-09 DIAGNOSIS — Z90710 Acquired absence of both cervix and uterus: Secondary | ICD-10-CM | POA: Diagnosis not present

## 2020-09-09 DIAGNOSIS — E039 Hypothyroidism, unspecified: Secondary | ICD-10-CM | POA: Diagnosis not present

## 2020-09-09 DIAGNOSIS — I10 Essential (primary) hypertension: Secondary | ICD-10-CM | POA: Diagnosis not present

## 2020-09-09 DIAGNOSIS — Z48815 Encounter for surgical aftercare following surgery on the digestive system: Secondary | ICD-10-CM | POA: Diagnosis not present

## 2020-09-10 ENCOUNTER — Ambulatory Visit (INDEPENDENT_AMBULATORY_CARE_PROVIDER_SITE_OTHER): Payer: Medicare Other | Admitting: General Surgery

## 2020-09-10 ENCOUNTER — Encounter: Payer: Self-pay | Admitting: General Surgery

## 2020-09-10 ENCOUNTER — Other Ambulatory Visit: Payer: Self-pay

## 2020-09-10 VITALS — BP 156/75 | HR 69 | Temp 98.4°F | Resp 16 | Ht 59.0 in | Wt 111.0 lb

## 2020-09-10 DIAGNOSIS — K56609 Unspecified intestinal obstruction, unspecified as to partial versus complete obstruction: Secondary | ICD-10-CM

## 2020-09-10 NOTE — Progress Notes (Signed)
[facility] Note   HPI:  84 y.o. Female presents to clinic for post-op follow-up evaluation after ex lap and LOA for a SBO. She is doing well and having no issues. She is having regular Bms and tolerating her diet.  Review of Systems:  No fever or chills No drainage from wound or redness  All other review of systems: otherwise negative   Vital Signs:  BP (!) 156/75   Pulse 69   Temp 98.4 F (36.9 C) (Oral)   Resp 16   Ht 4\' 11"  (1.499 m)   Wt 111 lb (50.3 kg)   SpO2 97%   BMI 22.42 kg/m    Physical Exam:  Physical Exam Vitals reviewed.  Cardiovascular:     Rate and Rhythm: Normal rate.  Pulmonary:     Effort: Pulmonary effort is normal.     Breath sounds: Normal breath sounds.  Abdominal:     General: There is no distension.     Palpations: Abdomen is soft.     Tenderness: There is no abdominal tenderness.     Hernia: No hernia is present.     Comments: Healing midline incision, staples removed, steri strips placed, no erythema or drainage  Neurological:     Mental Status: She is alert.     Assessment:  84 y.o. yo Female s/p ex lap LOA for SBO. Doing well and healing.  Plan:  No heavy lifting > 10 lbs, excessive bending, pushing, pulling, or squatting for 6-8 weeks after surgery.  Diet as tolerated. Ok to drive if not taking narcotic pain medication and can slam on breaks/ stir the wheel.  Steri strips will fall off in 5-7 days, once peeling up ok to remove.   All of the above recommendations were discussed with the patient, and all of patient's questions were answered to her expressed satisfaction.  Curlene Labrum, MD [facility] [address] [place], [place] [address] [phone] (office)

## 2020-09-10 NOTE — Patient Instructions (Addendum)
No heavy lifting > 10 lbs, excessive bending, pushing, pulling, or squatting for 6-8 weeks after surgery.  Diet as tolerated. Ok to drive if not taking narcotic pain medication and can slam on breaks/ stir the wheel.  Steri strips will fall off in 5-7 days, once peeling up ok to remove.

## 2020-09-11 DIAGNOSIS — Z90710 Acquired absence of both cervix and uterus: Secondary | ICD-10-CM | POA: Diagnosis not present

## 2020-09-11 DIAGNOSIS — E039 Hypothyroidism, unspecified: Secondary | ICD-10-CM | POA: Diagnosis not present

## 2020-09-11 DIAGNOSIS — H409 Unspecified glaucoma: Secondary | ICD-10-CM | POA: Diagnosis not present

## 2020-09-11 DIAGNOSIS — Z48815 Encounter for surgical aftercare following surgery on the digestive system: Secondary | ICD-10-CM | POA: Diagnosis not present

## 2020-09-11 DIAGNOSIS — N179 Acute kidney failure, unspecified: Secondary | ICD-10-CM | POA: Diagnosis not present

## 2020-09-11 DIAGNOSIS — I10 Essential (primary) hypertension: Secondary | ICD-10-CM | POA: Diagnosis not present

## 2020-09-12 DIAGNOSIS — I10 Essential (primary) hypertension: Secondary | ICD-10-CM | POA: Diagnosis not present

## 2020-09-12 DIAGNOSIS — N179 Acute kidney failure, unspecified: Secondary | ICD-10-CM | POA: Diagnosis not present

## 2020-09-12 DIAGNOSIS — E039 Hypothyroidism, unspecified: Secondary | ICD-10-CM | POA: Diagnosis not present

## 2020-09-12 DIAGNOSIS — H409 Unspecified glaucoma: Secondary | ICD-10-CM | POA: Diagnosis not present

## 2020-09-12 DIAGNOSIS — Z48815 Encounter for surgical aftercare following surgery on the digestive system: Secondary | ICD-10-CM | POA: Diagnosis not present

## 2020-09-12 DIAGNOSIS — Z90710 Acquired absence of both cervix and uterus: Secondary | ICD-10-CM | POA: Diagnosis not present

## 2020-09-14 DIAGNOSIS — N179 Acute kidney failure, unspecified: Secondary | ICD-10-CM | POA: Diagnosis not present

## 2020-09-14 DIAGNOSIS — I10 Essential (primary) hypertension: Secondary | ICD-10-CM | POA: Diagnosis not present

## 2020-09-14 DIAGNOSIS — H409 Unspecified glaucoma: Secondary | ICD-10-CM | POA: Diagnosis not present

## 2020-09-14 DIAGNOSIS — Z48815 Encounter for surgical aftercare following surgery on the digestive system: Secondary | ICD-10-CM | POA: Diagnosis not present

## 2020-09-14 DIAGNOSIS — E039 Hypothyroidism, unspecified: Secondary | ICD-10-CM | POA: Diagnosis not present

## 2020-09-14 DIAGNOSIS — Z90710 Acquired absence of both cervix and uterus: Secondary | ICD-10-CM | POA: Diagnosis not present

## 2020-09-17 DIAGNOSIS — Z48815 Encounter for surgical aftercare following surgery on the digestive system: Secondary | ICD-10-CM | POA: Diagnosis not present

## 2020-09-17 DIAGNOSIS — E039 Hypothyroidism, unspecified: Secondary | ICD-10-CM | POA: Diagnosis not present

## 2020-09-17 DIAGNOSIS — I10 Essential (primary) hypertension: Secondary | ICD-10-CM | POA: Diagnosis not present

## 2020-09-17 DIAGNOSIS — N179 Acute kidney failure, unspecified: Secondary | ICD-10-CM | POA: Diagnosis not present

## 2020-09-17 DIAGNOSIS — Z90710 Acquired absence of both cervix and uterus: Secondary | ICD-10-CM | POA: Diagnosis not present

## 2020-09-17 DIAGNOSIS — H409 Unspecified glaucoma: Secondary | ICD-10-CM | POA: Diagnosis not present

## 2020-09-21 DIAGNOSIS — N179 Acute kidney failure, unspecified: Secondary | ICD-10-CM | POA: Diagnosis not present

## 2020-09-21 DIAGNOSIS — H409 Unspecified glaucoma: Secondary | ICD-10-CM | POA: Diagnosis not present

## 2020-09-21 DIAGNOSIS — Z90710 Acquired absence of both cervix and uterus: Secondary | ICD-10-CM | POA: Diagnosis not present

## 2020-09-21 DIAGNOSIS — Z48815 Encounter for surgical aftercare following surgery on the digestive system: Secondary | ICD-10-CM | POA: Diagnosis not present

## 2020-09-21 DIAGNOSIS — I10 Essential (primary) hypertension: Secondary | ICD-10-CM | POA: Diagnosis not present

## 2020-09-21 DIAGNOSIS — E039 Hypothyroidism, unspecified: Secondary | ICD-10-CM | POA: Diagnosis not present

## 2020-09-22 DIAGNOSIS — N179 Acute kidney failure, unspecified: Secondary | ICD-10-CM | POA: Diagnosis not present

## 2020-09-22 DIAGNOSIS — H409 Unspecified glaucoma: Secondary | ICD-10-CM | POA: Diagnosis not present

## 2020-09-22 DIAGNOSIS — Z48815 Encounter for surgical aftercare following surgery on the digestive system: Secondary | ICD-10-CM | POA: Diagnosis not present

## 2020-09-22 DIAGNOSIS — I10 Essential (primary) hypertension: Secondary | ICD-10-CM | POA: Diagnosis not present

## 2020-09-22 DIAGNOSIS — E039 Hypothyroidism, unspecified: Secondary | ICD-10-CM | POA: Diagnosis not present

## 2020-09-22 DIAGNOSIS — Z90710 Acquired absence of both cervix and uterus: Secondary | ICD-10-CM | POA: Diagnosis not present

## 2020-09-25 DIAGNOSIS — N179 Acute kidney failure, unspecified: Secondary | ICD-10-CM | POA: Diagnosis not present

## 2020-09-25 DIAGNOSIS — I10 Essential (primary) hypertension: Secondary | ICD-10-CM | POA: Diagnosis not present

## 2020-09-25 DIAGNOSIS — H409 Unspecified glaucoma: Secondary | ICD-10-CM | POA: Diagnosis not present

## 2020-09-25 DIAGNOSIS — Z90710 Acquired absence of both cervix and uterus: Secondary | ICD-10-CM | POA: Diagnosis not present

## 2020-09-25 DIAGNOSIS — E039 Hypothyroidism, unspecified: Secondary | ICD-10-CM | POA: Diagnosis not present

## 2020-09-25 DIAGNOSIS — Z48815 Encounter for surgical aftercare following surgery on the digestive system: Secondary | ICD-10-CM | POA: Diagnosis not present

## 2020-09-26 ENCOUNTER — Emergency Department (HOSPITAL_COMMUNITY)
Admission: EM | Admit: 2020-09-26 | Discharge: 2020-09-26 | Disposition: A | Discharge status: Home / Self Care | Payer: Medicare Other | Attending: Emergency Medicine | Admitting: Emergency Medicine

## 2020-09-26 ENCOUNTER — Other Ambulatory Visit: Payer: Self-pay

## 2020-09-26 ENCOUNTER — Emergency Department (HOSPITAL_COMMUNITY): Payer: Medicare Other

## 2020-09-26 ENCOUNTER — Encounter (HOSPITAL_COMMUNITY): Payer: Self-pay | Admitting: *Deleted

## 2020-09-26 DIAGNOSIS — R1110 Vomiting, unspecified: Secondary | ICD-10-CM | POA: Diagnosis not present

## 2020-09-26 DIAGNOSIS — R9431 Abnormal electrocardiogram [ECG] [EKG]: Secondary | ICD-10-CM | POA: Diagnosis not present

## 2020-09-26 DIAGNOSIS — E039 Hypothyroidism, unspecified: Secondary | ICD-10-CM | POA: Insufficient documentation

## 2020-09-26 DIAGNOSIS — Z79899 Other long term (current) drug therapy: Secondary | ICD-10-CM | POA: Diagnosis not present

## 2020-09-26 DIAGNOSIS — K5641 Fecal impaction: Secondary | ICD-10-CM | POA: Insufficient documentation

## 2020-09-26 DIAGNOSIS — R7989 Other specified abnormal findings of blood chemistry: Secondary | ICD-10-CM | POA: Insufficient documentation

## 2020-09-26 DIAGNOSIS — K5900 Constipation, unspecified: Secondary | ICD-10-CM | POA: Diagnosis present

## 2020-09-26 DIAGNOSIS — R109 Unspecified abdominal pain: Secondary | ICD-10-CM | POA: Diagnosis not present

## 2020-09-26 LAB — CBC WITH DIFFERENTIAL/PLATELET
Abs Immature Granulocytes: 0.02 10*3/uL (ref 0.00–0.07)
Basophils Absolute: 0 10*3/uL (ref 0.0–0.1)
Basophils Relative: 1 %
Eosinophils Absolute: 0.3 10*3/uL (ref 0.0–0.5)
Eosinophils Relative: 5 %
HCT: 38.5 % (ref 36.0–46.0)
Hemoglobin: 12.3 g/dL (ref 12.0–15.0)
Immature Granulocytes: 0 %
Lymphocytes Relative: 18 %
Lymphs Abs: 1.1 10*3/uL (ref 0.7–4.0)
MCH: 31.9 pg (ref 26.0–34.0)
MCHC: 31.9 g/dL (ref 30.0–36.0)
MCV: 100 fL (ref 80.0–100.0)
Monocytes Absolute: 0.6 10*3/uL (ref 0.1–1.0)
Monocytes Relative: 10 %
Neutro Abs: 4.1 10*3/uL (ref 1.7–7.7)
Neutrophils Relative %: 66 %
Platelets: 255 10*3/uL (ref 150–400)
RBC: 3.85 MIL/uL — ABNORMAL LOW (ref 3.87–5.11)
RDW: 13.5 % (ref 11.5–15.5)
WBC: 6.2 10*3/uL (ref 4.0–10.5)
nRBC: 0 % (ref 0.0–0.2)

## 2020-09-26 LAB — URINALYSIS, ROUTINE W REFLEX MICROSCOPIC
Bilirubin Urine: NEGATIVE
Glucose, UA: NEGATIVE mg/dL
Hgb urine dipstick: NEGATIVE
Ketones, ur: NEGATIVE mg/dL
Leukocytes,Ua: NEGATIVE
Nitrite: NEGATIVE
Protein, ur: NEGATIVE mg/dL
Specific Gravity, Urine: 1.005 (ref 1.005–1.030)
pH: 7 (ref 5.0–8.0)

## 2020-09-26 LAB — COMPREHENSIVE METABOLIC PANEL
ALT: 180 U/L — ABNORMAL HIGH (ref 0–44)
AST: 139 U/L — ABNORMAL HIGH (ref 15–41)
Albumin: 3.2 g/dL — ABNORMAL LOW (ref 3.5–5.0)
Alkaline Phosphatase: 216 U/L — ABNORMAL HIGH (ref 38–126)
Anion gap: 7 (ref 5–15)
BUN: 13 mg/dL (ref 8–23)
CO2: 26 mmol/L (ref 22–32)
Calcium: 9.1 mg/dL (ref 8.9–10.3)
Chloride: 104 mmol/L (ref 98–111)
Creatinine, Ser: 0.71 mg/dL (ref 0.44–1.00)
GFR, Estimated: >60 mL/min (ref >60)
Glucose, Bld: 124 mg/dL — ABNORMAL HIGH (ref 70–99)
Potassium: 3.8 mmol/L (ref 3.5–5.1)
Sodium: 137 mmol/L (ref 135–145)
Total Bilirubin: 2.4 mg/dL — ABNORMAL HIGH (ref 0.3–1.2)
Total Protein: 6.1 g/dL — ABNORMAL LOW (ref 6.5–8.1)

## 2020-09-26 LAB — LIPASE, BLOOD: Lipase: 47 U/L (ref 11–51)

## 2020-09-26 MED ORDER — FLEET ENEMA 7-19 GM/118ML RE ENEM
1.0000 | ENEMA | Freq: Once | RECTAL | Status: AC
  Administered 2020-09-26: 1 via RECTAL

## 2020-09-26 MED ORDER — FENTANYL CITRATE (PF) 100 MCG/2ML IJ SOLN: 25.0000 ug | Freq: Once | INTRAMUSCULAR | Status: AC

## 2020-09-26 MED ORDER — IOHEXOL 300 MG/ML  SOLN
100.0000 mL | Freq: Once | INTRAMUSCULAR | Status: AC | PRN
  Administered 2020-09-26: 100 mL via INTRAVENOUS

## 2020-09-26 MED ORDER — FENTANYL CITRATE (PF) 100 MCG/2ML IJ SOLN
INTRAMUSCULAR | Status: AC
  Administered 2020-09-26: 25 ug via INTRAVENOUS
  Filled 2020-09-26: qty 2

## 2020-09-26 MED ORDER — SODIUM CHLORIDE 0.9 % IV BOLUS
1000.0000 mL | Freq: Once | INTRAVENOUS | Status: AC
  Administered 2020-09-26: 1000 mL via INTRAVENOUS

## 2020-09-26 NOTE — ED Notes (Signed)
Pt was suppose to be on stool softeners and but pt didn't remember to take them.

## 2020-09-26 NOTE — ED Provider Notes (Addendum)
[facility]: [facility] EMERGENCY DEPARTMENT Provider Note   CSN: [id number] Arrival date & time: 09/26/20  1154     History Chief Complaint  Patient presents with  . Post-op Problem    LORANA MAFFEO is a 84 y.o. female history of small bowel obstruction, glaucoma, hypothyroidism, elevated blood pressure, hysterectomy.  Patient presents today for concern of small bowel obstruction.  She reports that on Wednesday, 09/23/2020 she began having some nausea and had one episode of nonbloody/nonbilious emesis.  Since that time she has had feelings of indigestion a mild full feeling in her upper abdomen which does not radiate and has been feeling nauseous but has not had any recurrent vomiting, she is only been able to tolerate small amounts of food and water since that time.  She reports that she is been feeling constipated as well over the past few days and has only had a small bowel movement yesterday which was nonbloody.  She reports that she is still passing gas.  She reports at the end of October of this year she had a small bowel obstruction and feels this presentation today is similar.  Denies fever/chills, chest pain/shortness of breath, cough, dysuria/hematuria, fall/injury or any additional concerns.  Additional history was provided by patient's daughter Dionne Milo due to patient's baseline memory issues.  HPI     Past Medical History:  Diagnosis Date  . Bowel obstruction ([place])   . Glaucoma   . Hypothyroidism     Patient Active Problem List   Diagnosis Date Noted  . Elevated blood pressure reading without diagnosis of hypertension 09/17/2016  . Hyperglycemia 09/17/2016  . Small bowel obstruction ([place]) 09/16/2016  . SBO (small bowel obstruction) ([place]) 09/16/2016  . Hypothyroidism 09/16/2016  . Leukocytosis 09/16/2016  . History of colonic polyps 08/25/2016    Past Surgical History:  Procedure Laterality Date  . ABDOMINAL HYSTERECTOMY    . cataract extraction both eyes    .  COLONOSCOPY  06/02/2011   Procedure: COLONOSCOPY;  Surgeon: Rogene Houston, MD;  Location: [facility];  Service: Endoscopy;  Laterality: N/A;  . COLONOSCOPY N/A 03/22/2017   Procedure: COLONOSCOPY;  Surgeon: Rogene Houston, MD;  Location: [facility] ENDO SUITE;  Service: Endoscopy;  Laterality: N/A;  830 - MOVED TO 5/23 @ 10:30  . LAPAROTOMY N/A 08/28/2020   Procedure: EXPLORATORY LAPAROTOMY;  Surgeon: Virl Cagey, MD;  Location: [facility] ORS;  Service: General;  Laterality: N/A;  . LYSIS OF ADHESION N/A 08/28/2020   Procedure: LYSIS OF ADHESIONS;  Surgeon: Virl Cagey, MD;  Location: [facility] ORS;  Service: General;  Laterality: N/A;  . POLYPECTOMY  03/22/2017   Procedure: POLYPECTOMY;  Surgeon: Rogene Houston, MD;  Location: [facility] SU[facility];  Service: Endoscopy;;  colon     OB History    Gravida  2   Para  2   Term  2   Preterm      AB      Living  2     SAB      TAB      Ectopic      Multiple      Live Births              Family History  Problem Relation Age of Onset  . Lung cancer Father   . Heart Problems Sister     Social History   Tobacco Use  . Smoking status: Never Smoker  . Smokeless tobacco: Never Used  Vaping Use  . Vaping  Use: Never used  Substance Use Topics  . Alcohol use: Yes    Alcohol/week: 4.0 standard drinks    Types: 4 Glasses of wine per week  . Drug use: No    Home Medications Prior to Admission medications   Medication Sig Start Date End Date Taking? Authorizing Provider  ALPHAGAN P 0.1 % SOLN Apply 1 drop to eye daily.  06/30/20   [provider]  Biotin 5000 MCG CAPS Take 5,000 mcg by mouth daily.    [provider]  Calcium-Magnesium-Vitamin D (CALCIUM 1200+D3 PO) Take 1 tablet by mouth daily.    [provider]  docusate sodium (COLACE) 100 MG capsule Take 1 capsule (100 mg total) by mouth 2 (two) times daily. 09/01/20   Kathie Dike, MD  folic acid (FOLVITE) 267 MCG tablet Take 800 mcg by mouth  daily.    [provider]  latanoprost (XALATAN) 0.005 % ophthalmic solution Place 1 drop into both eyes at bedtime. 09/06/16   [provider]  levothyroxine (SYNTHROID, LEVOTHROID) 50 MCG tablet Take 50 mcg by mouth daily before breakfast.    [provider]  Multiple Vitamin (MULTIVITAMIN WITH MINERALS) TABS tablet Take 1 tablet by mouth daily.    [provider]  Omega-3 Fatty Acids (FISH OIL) 1200 MG CAPS Take 1,200 mg by mouth 2 (two) times daily.    [provider]  polyethylene glycol (MIRALAX) 17 g packet Take 17 g by mouth daily as needed. 09/01/20   Kathie Dike, MD  Red Yeast Rice 600 MG CAPS Take 1,200 mg by mouth every evening.     [provider]    Allergies    Patient has no known allergies.  Review of Systems   Review of Systems Ten systems are reviewed and are negative for acute change except as noted in the HPI  Physical Exam Updated Vital Signs BP (!) 159/77   Pulse (!) 57   Temp 97.6 F (36.4 C) (Oral)   Resp 13   Ht '4\' 11"'  (1.499 m)   Wt 49.9 kg   SpO2 100%   BMI 22.22 kg/m   Physical Exam Constitutional:      General: She is not in acute distress.    Appearance: Normal appearance. She is well-developed. She is not ill-appearing or diaphoretic.  HENT:     Head: Normocephalic and atraumatic.  Eyes:     General: Vision grossly intact. Gaze aligned appropriately.     Pupils: Pupils are equal, round, and reactive to light.  Neck:     Trachea: Trachea and phonation normal.  Cardiovascular:     Rate and Rhythm: Normal rate and regular rhythm.  Pulmonary:     Effort: Pulmonary effort is normal. No respiratory distress.     Breath sounds: Normal breath sounds.  Abdominal:     General: Bowel sounds are normal. There is no distension.     Palpations: Abdomen is soft.     Tenderness: There is abdominal tenderness. There is no guarding or rebound.  Genitourinary:    Comments: Examination and  disimpaction chaperoned by destiny RN.  Patient with small nonthrombosed external hemorrhoid.  No palpable internal hemorrhoids.  Normal rectal tone.  Impaction palpable. Musculoskeletal:        General: Normal range of motion.     Cervical back: Normal range of motion.  Skin:    General: Skin is warm and dry.  Neurological:     Mental Status: She is alert.  GCS: GCS eye subscore is 4. GCS verbal subscore is 5. GCS motor subscore is 6.     Comments: Speech is clear and goal oriented, follows commands Major Cranial nerves without deficit, no facial droop Moves extremities without ataxia, coordination intact  Psychiatric:        Behavior: Behavior normal.     ED Results / Procedures / Treatments   Labs (all labs ordered are listed, but only abnormal results are displayed) Labs Reviewed  CBC WITH DIFFERENTIAL/PLATELET - Abnormal; Notable for the following components:      Result Value   RBC 3.85 (*)    All other components within normal limits  COMPREHENSIVE METABOLIC PANEL - Abnormal; Notable for the following components:   Glucose, Bld 124 (*)    Total Protein 6.1 (*)    Albumin 3.2 (*)    AST 139 (*)    ALT 180 (*)    Alkaline Phosphatase 216 (*)    Total Bilirubin 2.4 (*)    All other components within normal limits  LIPASE, BLOOD  URINALYSIS, ROUTINE W REFLEX MICROSCOPIC    EKG EKG Interpretation  Date/Time:  Saturday September 26 2020 13:41:30 EST Ventricular Rate:  58 PR Interval:    QRS Duration: 99 QT Interval:  448 QTC Calculation: 440 R Axis:   83 Text Interpretation: Sinus rhythm Borderline right axis deviation Low voltage, precordial leads Baseline wander in lead(s) V2 Confirmed by Noemi Chapel [phone]) on 09/26/2020 5:01:06 PM   Radiology CT Abdomen Pelvis W Contrast  Result Date: 09/26/2020 CLINICAL DATA:  Abdominal pain, vomiting, constipation. Exploratory laparotomy on 08/28/2020 with lysis of adhesions EXAM: CT ABDOMEN AND PELVIS WITH CONTRAST  TECHNIQUE: Multidetector CT imaging of the abdomen and pelvis was performed using the standard protocol following bolus administration of intravenous contrast. CONTRAST:  119m OMNIPAQUE IOHEXOL 300 MG/ML  SOLN COMPARISON:  CT 08/27/2020 FINDINGS: Lower chest: Included lung bases are clear. Hepatobiliary: No focal liver abnormality is seen. No gallstones, gallbladder wall thickening, or biliary dilatation. Pancreas: Unremarkable. No pancreatic ductal dilatation or surrounding inflammatory changes. Spleen: Normal in size without focal abnormality. Adrenals/Urinary Tract: Unremarkable adrenal glands. Kidneys enhance symmetrically without focal lesion, stone, or hydronephrosis. Ureters are nondilated. Urinary bladder appears unremarkable. Stomach/Bowel: Stomach is unremarkable. There are no abnormally dilated loops of small bowel. There is fecalization of small bowel contents within the ileum. Moderate colonic stool burden including a prominent rectal stool ball measuring 6.7 cm in diameter. Scattered diverticulosis. No pericolonic inflammatory changes. Vascular/Lymphatic: Scattered aortoiliac atherosclerotic calcifications without aneurysm. No abdominopelvic lymphadenopathy. Reproductive: Status post hysterectomy. No adnexal masses. Other: Midline infraumbilical abdominal wall incision site without postoperative fluid collection. No abdominal wall hernia. No ascites. No pneumoperitoneum. Musculoskeletal: Chronic compression deformity of T11, unchanged from prior. No new or acute osseous findings. IMPRESSION: 1. Moderate colonic stool burden including a prominent rectal stool ball measuring 6.7 cm in diameter. 2. Fecalization of small bowel contents within the ileum suggesting slow transit. No dilated loops of small bowel to suggest obstruction. 3. Chronic compression deformity of T11, unchanged from prior. 4. Aortic atherosclerosis. (ICD10-I70.0). Electronically Signed   By: NDavina PokeD.O.   On: 09/26/2020  14:47    Procedures Fecal disimpaction  Date/Time: 09/26/2020 5:11 PM Performed by: MDeliah Boston PA-C Authorized by: MDeliah Boston PA-C  Preparation: Patient was prepped and draped in the usual sterile fashion. Local anesthesia used: no  Anesthesia: Local anesthesia used: no  Sedation: Patient sedated: no  Patient tolerance: patient tolerated the procedure  well with no immediate complications Comments: Consent obtained from patient and daughter. 82mg fentanyl given for discomfort during procedure.    (including critical care time)  Medications Ordered in ED Medications  sodium chloride 0.9 % bolus 1,000 mL (0 mLs Intravenous Stopped 09/26/20 1453)  iohexol (OMNIPAQUE) 300 MG/ML solution 100 mL (100 mLs Intravenous Contrast Given 09/26/20 1413)  fentaNYL (SUBLIMAZE) injection 25 mcg (25 mcg Intravenous Given 09/26/20 1537)  sodium phosphate (FLEET) 7-19 GM/118ML enema 1 enema (1 enema Rectal Given 09/26/20 1547)    ED Course  I have reviewed the triage vital signs and the nursing notes.  Pertinent labs & imaging results that were available during my care of the patient were reviewed by me and considered in my medical decision making (see chart for details).    MDM Rules/Calculators/A&P                         Additional history obtained from: 1. Nursing notes from this visit. 2. Family member at bedside. 3. Review of medical records. ---------------- 84year old female presented with decreased appetite, full feeling in the abdomen, constipation as well as nausea.  She had one episode of vomiting on Wednesday this week.  She feels this is similar to episode of small bowel obstruction last month.  On exam she has mildly generalized abdominal tenderness without guarding or peritoneal signs.  She has good bowel sounds and does report small bowel movement yesterday.  Based on patient's history there is concern for recurrence of SBO will obtain basic labs and CT  abdomen pelvis. ------- I ordered, reviewed and interpreted labs which include: CBC without leukocytosis to suggest bacterial infection, no anemia. Urinalysis without evidence of UTI, no hemoglobin or RBCs to suggest kidney stone disease. Lipase within normal limits, no evidence for pancreatitis. CMP shows elevation of LFTs, total bilirubin 2.4, alk phos 216, ALT 180, AST 139; no emergent electrolyte derangement, gap or AKI.  CT AP:  IMPRESSION:  1. Moderate colonic stool burden including a prominent rectal stool  ball measuring 6.7 cm in diameter.  2. Fecalization of small bowel contents within the ileum suggesting  slow transit. No dilated loops of small bowel to suggest  obstruction.  3. Chronic compression deformity of T11, unchanged from prior.  4. Aortic atherosclerosis. (ICD10-I70.0).  - Patient seen and evaluated by Dr. NKathrynn Humble plan for digital disimpaction, enema and close follow-up with PCP regarding LFTs. - Epic chat consult with Dr.Bridges given patient's recent SBO history, Dr. BConstance Hawagrees with disimpaction, no indication for surgical intervention.  Good bowel regimen advised. - Consent obtained from patient and her daughter for digital disimpaction.  Rectal examination and disimpaction chaperoned by destiny RN.  Disimpaction performed with some success, then a Fleet enema was placed shortly afterwards house informed by RN that patient had a large bowel movement and felt improved and was requesting discharge.  Patient encouraged to follow good bowel regimen at home with MiraLAX and fluids and follow-up with PCP.  She was informed elevation of LFTs today and will need close PCP follow-up and recheck of LFTs next week and they stated understanding.  There is no indication for further ER work-up at this time.  At this time there does not appear to be any evidence of an acute emergency medical condition and the patient appears stable for discharge with appropriate outpatient  follow up. Diagnosis was discussed with patient who verbalizes understanding of care plan and is agreeable to discharge. I have  discussed return precautions with patient and daughter who verbalizes understanding. Patient encouraged to follow-up with their PCP. All questions answered.  Note: Portions of this report may have been transcribed using voice recognition software. Every effort was made to ensure accuracy; however, inadvertent computerized transcription errors may still be present. Final Clinical Impression(s) / ED Diagnoses Final diagnoses:  Elevated LFTs  Fecal impaction [facility])    Rx / DC Orders ED Discharge Orders    None       Deliah Boston, PA-C 09/26/20 1709    Deliah Boston, PA-C 09/26/20 1711    Deliah Boston, PA-C 09/26/20 1736    Varney Biles, MD 09/27/20 0730

## 2020-09-26 NOTE — Discharge Instructions (Addendum)
At this time there does not appear to be the presence of an emergent medical condition, however there is always the potential for conditions to change. Please read and follow the below instructions.  Please return to the Emergency Department immediately for any new or worsening symptoms. Please be sure to follow up with your Primary Care Provider within one week regarding your visit today; please call their office to schedule an appointment even if you are feeling better for a follow-up visit. Your liver function tests were elevated today.  Please call your primary care doctor tomorrow morning to schedule a follow-up appointment.  You will need your liver function tests rechecked in the next week to see if they improve and for follow-up testing if needed. Please drink plenty water to avoid dehydration and follow good bowel hygiene.  You may use MiraLAX, 1 capful every day to help facilitate regular bowel movements.  Go to the nearest Emergency Department immediately if: You have fever or chills You have black or tarry stools. You cannot stop vomiting. Your pain is only in areas of your belly, such as the right side or the left lower part of the belly. You have bloody or black poop, or poop that looks like tar. You have very bad pain, cramping, or bloating in your belly. You have signs of not having enough fluid or water in your body (dehydration), such as: Dark pee, very little pee, or no pee. Cracked lips. Dry mouth. Sunken eyes. Sleepiness. Weakness. You have trouble breathing or chest pain. You have any new/concerning or worsening of symptoms  Please read the additional information packets attached to your discharge summary.  Do not take your medicine if  develop an itchy rash, swelling in your mouth or lips, or difficulty breathing; call 911 and seek immediate emergency medical attention if this occurs.  You may review your lab tests and imaging results in their entirety on your  MyChart account.  Please discuss all results of fully with your primary care provider and other specialist at your follow-up visit.  Note: Portions of this text may have been transcribed using voice recognition software. Every effort was made to ensure accuracy; however, inadvertent computerized transcription errors may still be present.

## 2020-09-26 NOTE — ED Triage Notes (Signed)
Pt with recent surgery on 10/26 due to bowel obstruction, pt had adhesions.  Emesis x 1 on Wednesday.  Feels like indigestion a lot for past few days.  LBM yesterday but pt has memory issues and unable to remember if BM was normal or not.

## 2020-09-29 DIAGNOSIS — N179 Acute kidney failure, unspecified: Secondary | ICD-10-CM | POA: Diagnosis not present

## 2020-09-29 DIAGNOSIS — E039 Hypothyroidism, unspecified: Secondary | ICD-10-CM | POA: Diagnosis not present

## 2020-09-29 DIAGNOSIS — I10 Essential (primary) hypertension: Secondary | ICD-10-CM | POA: Diagnosis not present

## 2020-09-29 DIAGNOSIS — Z48815 Encounter for surgical aftercare following surgery on the digestive system: Secondary | ICD-10-CM | POA: Diagnosis not present

## 2020-09-29 DIAGNOSIS — Z90710 Acquired absence of both cervix and uterus: Secondary | ICD-10-CM | POA: Diagnosis not present

## 2020-09-29 DIAGNOSIS — H409 Unspecified glaucoma: Secondary | ICD-10-CM | POA: Diagnosis not present

## 2020-10-03 DIAGNOSIS — Z9181 History of falling: Secondary | ICD-10-CM | POA: Diagnosis not present

## 2020-10-03 DIAGNOSIS — N179 Acute kidney failure, unspecified: Secondary | ICD-10-CM | POA: Diagnosis not present

## 2020-10-03 DIAGNOSIS — E039 Hypothyroidism, unspecified: Secondary | ICD-10-CM | POA: Diagnosis not present

## 2020-10-03 DIAGNOSIS — H409 Unspecified glaucoma: Secondary | ICD-10-CM | POA: Diagnosis not present

## 2020-10-03 DIAGNOSIS — Z90710 Acquired absence of both cervix and uterus: Secondary | ICD-10-CM | POA: Diagnosis not present

## 2020-10-03 DIAGNOSIS — I10 Essential (primary) hypertension: Secondary | ICD-10-CM | POA: Diagnosis not present

## 2020-10-03 DIAGNOSIS — Z48815 Encounter for surgical aftercare following surgery on the digestive system: Secondary | ICD-10-CM | POA: Diagnosis not present

## 2020-10-05 ENCOUNTER — Other Ambulatory Visit (HOSPITAL_COMMUNITY)
Admission: RE | Admit: 2020-10-05 | Discharge: 2020-10-05 | Disposition: A | Discharge status: Home / Self Care | Payer: Medicare Other | Source: Other Acute Inpatient Hospital | Attending: Internal Medicine | Admitting: Internal Medicine

## 2020-10-05 DIAGNOSIS — N179 Acute kidney failure, unspecified: Secondary | ICD-10-CM | POA: Diagnosis not present

## 2020-10-05 DIAGNOSIS — N390 Urinary tract infection, site not specified: Secondary | ICD-10-CM | POA: Insufficient documentation

## 2020-10-05 DIAGNOSIS — R7401 Elevation of levels of liver transaminase levels: Secondary | ICD-10-CM | POA: Insufficient documentation

## 2020-10-05 DIAGNOSIS — E785 Hyperlipidemia, unspecified: Secondary | ICD-10-CM | POA: Insufficient documentation

## 2020-10-05 DIAGNOSIS — Z48815 Encounter for surgical aftercare following surgery on the digestive system: Secondary | ICD-10-CM | POA: Diagnosis not present

## 2020-10-05 DIAGNOSIS — I10 Essential (primary) hypertension: Secondary | ICD-10-CM | POA: Diagnosis not present

## 2020-10-05 DIAGNOSIS — H409 Unspecified glaucoma: Secondary | ICD-10-CM | POA: Diagnosis not present

## 2020-10-05 DIAGNOSIS — F0390 Unspecified dementia without behavioral disturbance: Secondary | ICD-10-CM | POA: Insufficient documentation

## 2020-10-05 DIAGNOSIS — E039 Hypothyroidism, unspecified: Secondary | ICD-10-CM | POA: Diagnosis not present

## 2020-10-05 DIAGNOSIS — Z90710 Acquired absence of both cervix and uterus: Secondary | ICD-10-CM | POA: Diagnosis not present

## 2020-10-05 DIAGNOSIS — R350 Frequency of micturition: Secondary | ICD-10-CM | POA: Diagnosis present

## 2020-10-05 LAB — COMPREHENSIVE METABOLIC PANEL
ALT: 69 U/L — ABNORMAL HIGH (ref 0–44)
AST: 47 U/L — ABNORMAL HIGH (ref 15–41)
Albumin: 3.8 g/dL (ref 3.5–5.0)
Alkaline Phosphatase: 256 U/L — ABNORMAL HIGH (ref 38–126)
Anion gap: 12 (ref 5–15)
BUN: 18 mg/dL (ref 8–23)
CO2: 22 mmol/L (ref 22–32)
Calcium: 10.1 mg/dL (ref 8.9–10.3)
Chloride: 103 mmol/L (ref 98–111)
Creatinine, Ser: 0.82 mg/dL (ref 0.44–1.00)
GFR, Estimated: >60 mL/min (ref >60)
Glucose, Bld: 84 mg/dL (ref 70–99)
Potassium: 3.9 mmol/L (ref 3.5–5.1)
Sodium: 137 mmol/L (ref 135–145)
Total Bilirubin: 2 mg/dL — ABNORMAL HIGH (ref 0.3–1.2)
Total Protein: 7 g/dL (ref 6.5–8.1)

## 2020-10-05 LAB — CBC WITH DIFFERENTIAL/PLATELET
Abs Immature Granulocytes: 0.03 10*3/uL (ref 0.00–0.07)
Basophils Absolute: 0.1 10*3/uL (ref 0.0–0.1)
Basophils Relative: 1 %
Eosinophils Absolute: 0.3 10*3/uL (ref 0.0–0.5)
Eosinophils Relative: 3 %
HCT: 39.4 % (ref 36.0–46.0)
Hemoglobin: 12.7 g/dL (ref 12.0–15.0)
Immature Granulocytes: 0 %
Lymphocytes Relative: 21 %
Lymphs Abs: 1.8 10*3/uL (ref 0.7–4.0)
MCH: 32.2 pg (ref 26.0–34.0)
MCHC: 32.2 g/dL (ref 30.0–36.0)
MCV: 99.7 fL (ref 80.0–100.0)
Monocytes Absolute: 0.8 10*3/uL (ref 0.1–1.0)
Monocytes Relative: 10 %
Neutro Abs: 5.3 10*3/uL (ref 1.7–7.7)
Neutrophils Relative %: 65 %
Platelets: 380 10*3/uL (ref 150–400)
RBC: 3.95 MIL/uL (ref 3.87–5.11)
RDW: 14.4 % (ref 11.5–15.5)
WBC: 8.2 10*3/uL (ref 4.0–10.5)
nRBC: 0 % (ref 0.0–0.2)

## 2020-10-05 LAB — URINALYSIS, ROUTINE W REFLEX MICROSCOPIC
Bilirubin Urine: NEGATIVE
Glucose, UA: NEGATIVE mg/dL
Hgb urine dipstick: NEGATIVE
Ketones, ur: NEGATIVE mg/dL
Nitrite: NEGATIVE
Protein, ur: NEGATIVE mg/dL
Specific Gravity, Urine: 1.009 (ref 1.005–1.030)
pH: 6 (ref 5.0–8.0)

## 2020-10-05 LAB — LIPID PANEL
Cholesterol: 237 mg/dL — ABNORMAL HIGH (ref 0–200)
HDL: 62 mg/dL (ref >40)
LDL Cholesterol: 151 mg/dL — ABNORMAL HIGH (ref 0–99)
Total CHOL/HDL Ratio: 3.8 RATIO
Triglycerides: 122 mg/dL (ref <150)
VLDL: 24 mg/dL (ref 0–40)

## 2020-10-06 DIAGNOSIS — H409 Unspecified glaucoma: Secondary | ICD-10-CM | POA: Diagnosis not present

## 2020-10-06 DIAGNOSIS — E782 Mixed hyperlipidemia: Secondary | ICD-10-CM | POA: Diagnosis not present

## 2020-10-06 DIAGNOSIS — K5900 Constipation, unspecified: Secondary | ICD-10-CM | POA: Diagnosis not present

## 2020-10-06 DIAGNOSIS — R3981 Functional urinary incontinence: Secondary | ICD-10-CM | POA: Diagnosis not present

## 2020-10-06 DIAGNOSIS — I10 Essential (primary) hypertension: Secondary | ICD-10-CM | POA: Diagnosis not present

## 2020-10-06 DIAGNOSIS — G3184 Mild cognitive impairment, so stated: Secondary | ICD-10-CM | POA: Diagnosis not present

## 2020-10-06 DIAGNOSIS — R921 Mammographic calcification found on diagnostic imaging of breast: Secondary | ICD-10-CM | POA: Diagnosis not present

## 2020-10-06 DIAGNOSIS — E039 Hypothyroidism, unspecified: Secondary | ICD-10-CM | POA: Diagnosis not present

## 2020-10-12 DIAGNOSIS — E039 Hypothyroidism, unspecified: Secondary | ICD-10-CM | POA: Diagnosis not present

## 2020-10-12 DIAGNOSIS — Z90710 Acquired absence of both cervix and uterus: Secondary | ICD-10-CM | POA: Diagnosis not present

## 2020-10-12 DIAGNOSIS — N179 Acute kidney failure, unspecified: Secondary | ICD-10-CM | POA: Diagnosis not present

## 2020-10-12 DIAGNOSIS — I10 Essential (primary) hypertension: Secondary | ICD-10-CM | POA: Diagnosis not present

## 2020-10-12 DIAGNOSIS — Z48815 Encounter for surgical aftercare following surgery on the digestive system: Secondary | ICD-10-CM | POA: Diagnosis not present

## 2020-10-12 DIAGNOSIS — H409 Unspecified glaucoma: Secondary | ICD-10-CM | POA: Diagnosis not present

## 2020-10-16 DIAGNOSIS — H401231 Low-tension glaucoma, bilateral, mild stage: Secondary | ICD-10-CM | POA: Diagnosis not present

## 2020-10-16 DIAGNOSIS — Z961 Presence of intraocular lens: Secondary | ICD-10-CM | POA: Diagnosis not present

## 2020-10-21 DIAGNOSIS — I10 Essential (primary) hypertension: Secondary | ICD-10-CM | POA: Diagnosis not present

## 2020-10-21 DIAGNOSIS — N179 Acute kidney failure, unspecified: Secondary | ICD-10-CM | POA: Diagnosis not present

## 2020-10-21 DIAGNOSIS — Z90710 Acquired absence of both cervix and uterus: Secondary | ICD-10-CM | POA: Diagnosis not present

## 2020-10-21 DIAGNOSIS — E039 Hypothyroidism, unspecified: Secondary | ICD-10-CM | POA: Diagnosis not present

## 2020-10-21 DIAGNOSIS — Z48815 Encounter for surgical aftercare following surgery on the digestive system: Secondary | ICD-10-CM | POA: Diagnosis not present

## 2020-10-21 DIAGNOSIS — H409 Unspecified glaucoma: Secondary | ICD-10-CM | POA: Diagnosis not present

## 2020-10-27 DIAGNOSIS — E039 Hypothyroidism, unspecified: Secondary | ICD-10-CM | POA: Diagnosis not present

## 2020-10-27 DIAGNOSIS — I10 Essential (primary) hypertension: Secondary | ICD-10-CM | POA: Diagnosis not present

## 2020-10-27 DIAGNOSIS — H409 Unspecified glaucoma: Secondary | ICD-10-CM | POA: Diagnosis not present

## 2020-10-27 DIAGNOSIS — N179 Acute kidney failure, unspecified: Secondary | ICD-10-CM | POA: Diagnosis not present

## 2020-10-27 DIAGNOSIS — Z48815 Encounter for surgical aftercare following surgery on the digestive system: Secondary | ICD-10-CM | POA: Diagnosis not present

## 2020-10-27 DIAGNOSIS — Z90710 Acquired absence of both cervix and uterus: Secondary | ICD-10-CM | POA: Diagnosis not present

## 2020-10-28 DIAGNOSIS — R921 Mammographic calcification found on diagnostic imaging of breast: Secondary | ICD-10-CM | POA: Diagnosis not present

## 2020-10-28 DIAGNOSIS — G3184 Mild cognitive impairment, so stated: Secondary | ICD-10-CM | POA: Diagnosis not present

## 2020-10-28 DIAGNOSIS — E039 Hypothyroidism, unspecified: Secondary | ICD-10-CM | POA: Diagnosis not present

## 2020-10-28 DIAGNOSIS — E7849 Other hyperlipidemia: Secondary | ICD-10-CM | POA: Diagnosis not present

## 2020-10-28 DIAGNOSIS — L989 Disorder of the skin and subcutaneous tissue, unspecified: Secondary | ICD-10-CM | POA: Diagnosis not present

## 2020-10-28 DIAGNOSIS — G4762 Sleep related leg cramps: Secondary | ICD-10-CM | POA: Diagnosis not present

## 2020-10-28 DIAGNOSIS — E782 Mixed hyperlipidemia: Secondary | ICD-10-CM | POA: Diagnosis not present

## 2020-10-28 DIAGNOSIS — R42 Dizziness and giddiness: Secondary | ICD-10-CM | POA: Diagnosis not present

## 2020-10-28 DIAGNOSIS — I10 Essential (primary) hypertension: Secondary | ICD-10-CM | POA: Diagnosis not present

## 2020-10-28 DIAGNOSIS — N3281 Overactive bladder: Secondary | ICD-10-CM | POA: Diagnosis not present

## 2020-10-28 DIAGNOSIS — Z6822 Body mass index (BMI) 22.0-22.9, adult: Secondary | ICD-10-CM | POA: Diagnosis not present

## 2020-10-28 DIAGNOSIS — R3981 Functional urinary incontinence: Secondary | ICD-10-CM | POA: Diagnosis not present

## 2020-10-28 DIAGNOSIS — H409 Unspecified glaucoma: Secondary | ICD-10-CM | POA: Diagnosis not present

## 2020-10-28 DIAGNOSIS — K5900 Constipation, unspecified: Secondary | ICD-10-CM | POA: Diagnosis not present

## 2020-10-28 DIAGNOSIS — K591 Functional diarrhea: Secondary | ICD-10-CM | POA: Diagnosis not present

## 2020-10-30 DIAGNOSIS — H409 Unspecified glaucoma: Secondary | ICD-10-CM | POA: Diagnosis not present

## 2020-10-30 DIAGNOSIS — K5900 Constipation, unspecified: Secondary | ICD-10-CM | POA: Diagnosis not present

## 2020-10-30 DIAGNOSIS — R921 Mammographic calcification found on diagnostic imaging of breast: Secondary | ICD-10-CM | POA: Diagnosis not present

## 2020-10-30 DIAGNOSIS — G3184 Mild cognitive impairment, so stated: Secondary | ICD-10-CM | POA: Diagnosis not present

## 2020-10-30 DIAGNOSIS — E782 Mixed hyperlipidemia: Secondary | ICD-10-CM | POA: Diagnosis not present

## 2020-10-30 DIAGNOSIS — I10 Essential (primary) hypertension: Secondary | ICD-10-CM | POA: Diagnosis not present

## 2020-10-30 DIAGNOSIS — R3981 Functional urinary incontinence: Secondary | ICD-10-CM | POA: Diagnosis not present

## 2020-10-30 DIAGNOSIS — E039 Hypothyroidism, unspecified: Secondary | ICD-10-CM | POA: Diagnosis not present

## 2020-11-13 ENCOUNTER — Ambulatory Visit
Admission: RE | Admit: 2020-11-13 | Discharge: 2020-11-13 | Disposition: A | Discharge status: Home / Self Care | Payer: Medicare Other | Source: Ambulatory Visit | Attending: Internal Medicine | Admitting: Internal Medicine

## 2020-11-13 ENCOUNTER — Other Ambulatory Visit: Payer: Self-pay

## 2020-11-13 ENCOUNTER — Other Ambulatory Visit: Payer: Self-pay | Admitting: Internal Medicine

## 2020-11-13 DIAGNOSIS — R921 Mammographic calcification found on diagnostic imaging of breast: Secondary | ICD-10-CM | POA: Diagnosis not present

## 2020-11-13 DIAGNOSIS — R922 Inconclusive mammogram: Secondary | ICD-10-CM | POA: Diagnosis not present

## 2020-12-05 ENCOUNTER — Other Ambulatory Visit: Payer: Self-pay

## 2020-12-05 ENCOUNTER — Ambulatory Visit
Admission: EM | Admit: 2020-12-05 | Discharge: 2020-12-05 | Disposition: A | Discharge status: Home / Self Care | Payer: Medicare Other | Attending: Family Medicine | Admitting: Family Medicine

## 2020-12-05 ENCOUNTER — Encounter: Payer: Self-pay | Admitting: Emergency Medicine

## 2020-12-05 DIAGNOSIS — R030 Elevated blood-pressure reading, without diagnosis of hypertension: Secondary | ICD-10-CM | POA: Diagnosis not present

## 2020-12-05 DIAGNOSIS — R531 Weakness: Secondary | ICD-10-CM | POA: Insufficient documentation

## 2020-12-05 DIAGNOSIS — R42 Dizziness and giddiness: Secondary | ICD-10-CM | POA: Insufficient documentation

## 2020-12-05 LAB — POCT URINALYSIS DIP (MANUAL ENTRY)
Bilirubin, UA: NEGATIVE
Glucose, UA: NEGATIVE mg/dL
Ketones, POC UA: NEGATIVE mg/dL
Leukocytes, UA: NEGATIVE
Nitrite, UA: NEGATIVE
Protein Ur, POC: NEGATIVE mg/dL
Spec Grav, UA: 1.02 (ref 1.010–1.025)
Urobilinogen, UA: 0.2 E.U./dL
pH, UA: 7.5 (ref 5.0–8.0)

## 2020-12-05 NOTE — ED Triage Notes (Signed)
Dizzy and weak feeling today.  Dizziness comes and goes.  Started new medication 6 weeks ago, memantine 5 mg BID.  side effect of medication is dizzy

## 2020-12-05 NOTE — Discharge Instructions (Addendum)
Blood work pending. Will inform of abnormal results as needed  UA in office   We will culture urine to rule out infection here as well  Follow up with this office or with primary care if symptoms are persisting.  Follow up in the ER for high fever, trouble swallowing, trouble breathing, other concerning symptoms.

## 2020-12-06 LAB — CBC WITH DIFFERENTIAL/PLATELET
Basophils Absolute: 0.1 10*3/uL (ref 0.0–0.2)
Basos: 1 %
EOS (ABSOLUTE): 0.2 10*3/uL (ref 0.0–0.4)
Eos: 2 %
Hematocrit: 42.9 % (ref 34.0–46.6)
Hemoglobin: 14.2 g/dL (ref 11.1–15.9)
Immature Grans (Abs): 0 10*3/uL (ref 0.0–0.1)
Immature Granulocytes: 0 %
Lymphocytes Absolute: 2.2 10*3/uL (ref 0.7–3.1)
Lymphs: 29 %
MCH: 32.4 pg (ref 26.6–33.0)
MCHC: 33.1 g/dL (ref 31.5–35.7)
MCV: 98 fL — ABNORMAL HIGH (ref 79–97)
Monocytes Absolute: 0.7 10*3/uL (ref 0.1–0.9)
Monocytes: 9 %
Neutrophils Absolute: 4.6 10*3/uL (ref 1.4–7.0)
Neutrophils: 59 %
Platelets: 301 10*3/uL (ref 150–450)
RBC: 4.38 x10E6/uL (ref 3.77–5.28)
RDW: 12 % (ref 11.7–15.4)
WBC: 7.7 10*3/uL (ref 3.4–10.8)

## 2020-12-06 LAB — COMPREHENSIVE METABOLIC PANEL
ALT: 18 IU/L (ref 0–32)
AST: 25 IU/L (ref 0–40)
Albumin/Globulin Ratio: 1.6 (ref 1.2–2.2)
Albumin: 4.1 g/dL (ref 3.6–4.6)
Alkaline Phosphatase: 108 IU/L (ref 44–121)
BUN/Creatinine Ratio: 14 (ref 12–28)
BUN: 12 mg/dL (ref 8–27)
Bilirubin Total: 0.4 mg/dL (ref 0.0–1.2)
CO2: 21 mmol/L (ref 20–29)
Calcium: 9.6 mg/dL (ref 8.7–10.3)
Chloride: 108 mmol/L — ABNORMAL HIGH (ref 96–106)
Creatinine, Ser: 0.87 mg/dL (ref 0.57–1.00)
GFR calc Af Amer: 72 mL/min/{1.73_m2} (ref >59)
GFR calc non Af Amer: 62 mL/min/{1.73_m2} (ref >59)
Globulin, Total: 2.5 g/dL (ref 1.5–4.5)
Glucose: 142 mg/dL — ABNORMAL HIGH (ref 65–99)
Potassium: 3.9 mmol/L (ref 3.5–5.2)
Sodium: 144 mmol/L (ref 134–144)
Total Protein: 6.6 g/dL (ref 6.0–8.5)

## 2020-12-06 LAB — URINE CULTURE: Culture: NO GROWTH

## 2020-12-06 NOTE — ED Provider Notes (Signed)
[facility]    CSN: [id number] Arrival date & time: 12/05/20  1504      History   Chief Complaint No chief complaint on file.   HPI Megan Conley is a 85 y.o. female.   Patient reports that she has been feeling weak and dizzy.  Reports that the dizziness comes and goes, she is not currently dizzy.  Reports that she started memantine 6 weeks ago.  She feels like this may be the cause of her symptoms.  Daughter states that she has history of bowel obstruction with surgical repairs and complications afterwards.  Daughter states that she is not drinking enough water or taking her MiraLAX that she is supposed to.  Daughter reports that the patient's blood pressure is recently elevated.  States that she has never had any issue with her blood pressure in the past.  Does not take any medication for hypertension.  Denies headache, cough, nausea, vomiting, diarrhea, rash, fever, other symptoms.  ROS per HPI  The history is provided by the patient and a relative.    Past Medical History:  Diagnosis Date  . Bowel obstruction ([place])   . Glaucoma   . Hypothyroidism     Patient Active Problem List   Diagnosis Date Noted  . Elevated blood pressure reading without diagnosis of hypertension 09/17/2016  . Hyperglycemia 09/17/2016  . Small bowel obstruction ([place]) 09/16/2016  . SBO (small bowel obstruction) ([place]) 09/16/2016  . Hypothyroidism 09/16/2016  . Leukocytosis 09/16/2016  . History of colonic polyps 08/25/2016    Past Surgical History:  Procedure Laterality Date  . ABDOMINAL HYSTERECTOMY    . cataract extraction both eyes    . COLONOSCOPY  06/02/2011   Procedure: COLONOSCOPY;  Surgeon: Rogene Houston, MD;  [facility];  Service: Endoscopy;  Laterality: N/A;  . COLONOSCOPY N/A 03/22/2017   Procedure: COLONOSCOPY;  Surgeon: Rogene Houston, MD;  [facility];  Service: Endoscopy;  Laterality: N/A;  830 - MOVED TO 5/23 @ 10:30  . LAPAROTOMY N/A  08/28/2020   Procedure: EXPLORATORY LAPAROTOMY;  Surgeon: Virl Cagey, MD;  Location: [facility];  Service: General;  Laterality: N/A;  . LYSIS OF ADHESION N/A 08/28/2020   Procedure: LYSIS OF ADHESIONS;  Surgeon: Virl Cagey, MD;  Location: [facility];  Service: General;  Laterality: N/A;  . POLYPECTOMY  03/22/2017   Procedure: POLYPECTOMY;  Surgeon: Rogene Houston, MD;  [facility];  Service: Endoscopy;;  colon    OB History    Gravida  2   Para  2   Term  2   Preterm      AB      Living  2     SAB      IAB      Ectopic      Multiple      Live Births               Home Medications    Prior to Admission medications   Medication Sig Start Date End Date Taking? Authorizing Provider  memantine (NAMENDA) 5 MG tablet Take 5 mg by mouth 2 (two) times daily.   Yes [provider]  ALPHAGAN P 0.1 % SOLN Apply 1 drop to eye daily.  06/30/20   [provider]  Biotin 5000 MCG CAPS Take 5,000 mcg by mouth daily.    [provider]  Calcium-Magnesium-Vitamin D (CALCIUM 1200+D3 PO) Take 1 tablet by mouth daily.  [provider]  docusate sodium (COLACE) 100 MG capsule Take 1 capsule (100 mg total) by mouth 2 (two) times daily. 09/01/20   Kathie Dike, MD  folic acid (FOLVITE) 188 MCG tablet Take 800 mcg by mouth daily.    [provider]  latanoprost (XALATAN) 0.005 % ophthalmic solution Place 1 drop into both eyes at bedtime. 09/06/16   [provider]  levothyroxine (SYNTHROID, LEVOTHROID) 50 MCG tablet Take 50 mcg by mouth daily before breakfast.    [provider]  Multiple Vitamin (MULTIVITAMIN WITH MINERALS) TABS tablet Take 1 tablet by mouth daily.    [provider]  Omega-3 Fatty Acids (FISH OIL) 1200 MG CAPS Take 1,200 mg by mouth 2 (two) times daily.    [provider]  polyethylene glycol (MIRALAX) 17 g packet Take 17 g by mouth daily as needed. 09/01/20   Kathie Dike, MD  Red Yeast Rice 600 MG CAPS Take 1,200 mg by mouth every evening.     [provider]    Family History Family History  Problem Relation Age of Onset  . Lung cancer Father   . Heart Problems Sister     Social History Social History   Tobacco Use  . Smoking status: Never Smoker  . Smokeless tobacco: Never Used  Vaping Use  . Vaping Use: Never used  Substance Use Topics  . Alcohol use: Yes    Alcohol/week: 4.0 standard drinks    Types: 4 Glasses of wine per week  . Drug use: No     Allergies   Patient has no known allergies.   Review of Systems Review of Systems   Physical Exam Triage Vital Signs ED Triage Vitals  Enc Vitals Group     BP 12/05/20 1513 (!) 183/83     Pulse Rate 12/05/20 1513 71     Resp 12/05/20 1513 16     Temp 12/05/20 1513 97.6 F (36.4 C)     Temp Source 12/05/20 1513 Tympanic     SpO2 12/05/20 1513 96 %     Weight --      Height --      Head Circumference --      Peak Flow --      Pain Score 12/05/20 1519 0     Pain Loc --      Pain Edu? --      Excl. in [place]? --    Orthostatic VS for the past 24 hrs:  BP- Lying Pulse- Lying BP- Sitting Pulse- Sitting BP- Standing at 0 minutes Pulse- Standing at 0 minutes  12/05/20 1522 185/79 64 183/83 64 173/88 71    Updated Vital Signs BP (!) 183/83 (BP Location: Right Arm)   Pulse 71   Temp 97.6 F (36.4 C) (Tympanic)   Resp 16   SpO2 96%   Visual Acuity Right Eye Distance:   Left Eye Distance:   Bilateral Distance:    Right Eye Near:   Left Eye Near:    Bilateral Near:     Physical Exam Vitals and nursing note reviewed.  Constitutional:      General: She is not in acute distress.    Appearance: Normal appearance. She is well-developed and well-nourished. She is not ill-appearing.  HENT:     Head: Normocephalic and atraumatic.     Right Ear: Tympanic membrane, ear canal and external ear normal.     Left Ear: Tympanic membrane, ear canal and external ear  normal.  Nose: Nose normal.     Mouth/Throat:     Mouth: Mucous membranes are moist.     Pharynx: Oropharynx is clear.  Eyes:     Extraocular Movements: Extraocular movements intact.     Conjunctiva/sclera: Conjunctivae normal.     Pupils: Pupils are equal, round, and reactive to light.  Cardiovascular:     Rate and Rhythm: Normal rate and regular rhythm.     Heart sounds: Normal heart sounds. No murmur heard.   Pulmonary:     Effort: Pulmonary effort is normal. No respiratory distress.     Breath sounds: Normal breath sounds.  Abdominal:     General: Bowel sounds are normal. There is no distension.     Palpations: Abdomen is soft. There is no mass.     Tenderness: There is no abdominal tenderness. There is no right CVA tenderness, left CVA tenderness, guarding or rebound.     Hernia: No hernia is present.  Musculoskeletal:        General: No edema. Normal range of motion.     Cervical back: Normal range of motion and neck supple.  Skin:    General: Skin is warm and dry.     Capillary Refill: Capillary refill takes less than 2 seconds.  Neurological:     General: No focal deficit present.     Mental Status: She is alert and oriented to person, place, and time.  Psychiatric:        Mood and Affect: Mood and affect and mood normal.        Behavior: Behavior normal.        Thought Content: Thought content normal.      UC Treatments / Results  Labs (all labs ordered are listed, but only abnormal results are displayed) Labs Reviewed  CBC WITH DIFFERENTIAL/PLATELET - Abnormal; Notable for the following components:      Result Value   MCV 98 (*)    All other components within normal limits   Narrative:    Performed at:  [address] [place], [place]  [id number] Lab Director: Rush Farmer MD, Phone:  [phone]  COMPREHENSIVE METABOLIC PANEL - Abnormal; Notable for the following components:   Glucose 142 (*)    Chloride 108 (*)    All other  components within normal limits   Narrative:    Performed at:  [address] [place], [place]  [id number] Lab Director: Rush Farmer MD, Phone:  [phone]  POCT URINALYSIS DIP (MANUAL ENTRY) - Abnormal; Notable for the following components:   Clarity, UA cloudy (*)    Blood, UA trace-intact (*)    All other components within normal limits  URINE CULTURE    EKG   Radiology No results found.  Procedures Procedures (including critical care time)  Medications Ordered in UC Medications - No data to display  Initial Impression / Assessment and Plan / UC Course  I have reviewed the triage vital signs and the nursing notes.  Pertinent labs & imaging results that were available during my care of the patient were reviewed by me and considered in my medical decision making (see chart for details).    Elevated blood pressure reading Dizziness Weakness  UA in office We will culture urine and inform of positive results that require further treatment Discussed hydration and the importance of taking her preventative medications for her bowel obstruction including MiraLAX and drinking plenty of fluids Discussed that this can also impact her  blood pressure as well as increase her risk of infection Discussed with family that normally dizziness and side effects memantine are seen a bit sooner when beginning the medication Discussed that it is still also safe to just stop the medication and see if this helps symptoms resolve CBC CMP drawn to help differentiate hydration and check electrolytes, check for anemia, check for infection We will inform of abnormal results and treat as indicated Follow up with this office or with primary care if symptoms are persisting.  Follow up in the ER for high fever, trouble swallowing, trouble breathing, other concerning symptoms.   Final Clinical Impressions(s) / UC Diagnoses   Final diagnoses:  Elevated blood pressure reading   Dizziness and giddiness  Weakness     Discharge Instructions     Blood work pending. Will inform of abnormal results as needed  UA in office   We will culture urine to rule out infection here as well  Follow up with this office or with primary care if symptoms are persisting.  Follow up in the ER for high fever, trouble swallowing, trouble breathing, other concerning symptoms.       ED Prescriptions    None     PDMP not reviewed this encounter.   Faustino Congress, NP 12/06/20 [phone]

## 2020-12-07 DIAGNOSIS — K5900 Constipation, unspecified: Secondary | ICD-10-CM | POA: Diagnosis not present

## 2020-12-07 DIAGNOSIS — R3981 Functional urinary incontinence: Secondary | ICD-10-CM | POA: Diagnosis not present

## 2020-12-07 DIAGNOSIS — E039 Hypothyroidism, unspecified: Secondary | ICD-10-CM | POA: Diagnosis not present

## 2020-12-07 DIAGNOSIS — G3184 Mild cognitive impairment, so stated: Secondary | ICD-10-CM | POA: Diagnosis not present

## 2020-12-07 DIAGNOSIS — R921 Mammographic calcification found on diagnostic imaging of breast: Secondary | ICD-10-CM | POA: Diagnosis not present

## 2020-12-07 DIAGNOSIS — H409 Unspecified glaucoma: Secondary | ICD-10-CM | POA: Diagnosis not present

## 2020-12-07 DIAGNOSIS — E782 Mixed hyperlipidemia: Secondary | ICD-10-CM | POA: Diagnosis not present

## 2020-12-07 DIAGNOSIS — L989 Disorder of the skin and subcutaneous tissue, unspecified: Secondary | ICD-10-CM | POA: Diagnosis not present

## 2020-12-07 DIAGNOSIS — I10 Essential (primary) hypertension: Secondary | ICD-10-CM | POA: Diagnosis not present

## 2020-12-16 DIAGNOSIS — E7849 Other hyperlipidemia: Secondary | ICD-10-CM | POA: Diagnosis not present

## 2020-12-16 DIAGNOSIS — L989 Disorder of the skin and subcutaneous tissue, unspecified: Secondary | ICD-10-CM | POA: Diagnosis not present

## 2020-12-16 DIAGNOSIS — G3184 Mild cognitive impairment, so stated: Secondary | ICD-10-CM | POA: Diagnosis not present

## 2020-12-16 DIAGNOSIS — R3981 Functional urinary incontinence: Secondary | ICD-10-CM | POA: Diagnosis not present

## 2020-12-16 DIAGNOSIS — N3281 Overactive bladder: Secondary | ICD-10-CM | POA: Diagnosis not present

## 2020-12-16 DIAGNOSIS — G4762 Sleep related leg cramps: Secondary | ICD-10-CM | POA: Diagnosis not present

## 2020-12-16 DIAGNOSIS — R350 Frequency of micturition: Secondary | ICD-10-CM | POA: Diagnosis not present

## 2020-12-16 DIAGNOSIS — E039 Hypothyroidism, unspecified: Secondary | ICD-10-CM | POA: Diagnosis not present

## 2020-12-16 DIAGNOSIS — Z6822 Body mass index (BMI) 22.0-22.9, adult: Secondary | ICD-10-CM | POA: Diagnosis not present

## 2020-12-16 DIAGNOSIS — R42 Dizziness and giddiness: Secondary | ICD-10-CM | POA: Diagnosis not present

## 2020-12-16 DIAGNOSIS — H409 Unspecified glaucoma: Secondary | ICD-10-CM | POA: Diagnosis not present

## 2020-12-16 DIAGNOSIS — I10 Essential (primary) hypertension: Secondary | ICD-10-CM | POA: Diagnosis not present

## 2020-12-16 DIAGNOSIS — K591 Functional diarrhea: Secondary | ICD-10-CM | POA: Diagnosis not present

## 2020-12-18 DIAGNOSIS — H903 Sensorineural hearing loss, bilateral: Secondary | ICD-10-CM | POA: Diagnosis not present

## 2020-12-18 DIAGNOSIS — H838X3 Other specified diseases of inner ear, bilateral: Secondary | ICD-10-CM | POA: Diagnosis not present

## 2020-12-28 DIAGNOSIS — N3281 Overactive bladder: Secondary | ICD-10-CM | POA: Diagnosis not present

## 2020-12-28 DIAGNOSIS — I10 Essential (primary) hypertension: Secondary | ICD-10-CM | POA: Diagnosis not present

## 2020-12-28 DIAGNOSIS — E782 Mixed hyperlipidemia: Secondary | ICD-10-CM | POA: Diagnosis not present

## 2021-01-01 DIAGNOSIS — G3184 Mild cognitive impairment, so stated: Secondary | ICD-10-CM | POA: Diagnosis not present

## 2021-01-01 DIAGNOSIS — R3981 Functional urinary incontinence: Secondary | ICD-10-CM | POA: Diagnosis not present

## 2021-01-01 DIAGNOSIS — R350 Frequency of micturition: Secondary | ICD-10-CM | POA: Diagnosis not present

## 2021-01-27 DIAGNOSIS — H409 Unspecified glaucoma: Secondary | ICD-10-CM | POA: Diagnosis not present

## 2021-01-27 DIAGNOSIS — E039 Hypothyroidism, unspecified: Secondary | ICD-10-CM | POA: Diagnosis not present

## 2021-01-27 DIAGNOSIS — E782 Mixed hyperlipidemia: Secondary | ICD-10-CM | POA: Diagnosis not present

## 2021-01-27 DIAGNOSIS — R3981 Functional urinary incontinence: Secondary | ICD-10-CM | POA: Diagnosis not present

## 2021-02-02 DIAGNOSIS — R350 Frequency of micturition: Secondary | ICD-10-CM | POA: Diagnosis not present

## 2021-02-02 DIAGNOSIS — R3981 Functional urinary incontinence: Secondary | ICD-10-CM | POA: Diagnosis not present

## 2021-02-02 DIAGNOSIS — G3184 Mild cognitive impairment, so stated: Secondary | ICD-10-CM | POA: Diagnosis not present

## 2021-02-05 DIAGNOSIS — Z23 Encounter for immunization: Secondary | ICD-10-CM | POA: Diagnosis not present

## 2021-03-01 DIAGNOSIS — S0083XA Contusion of other part of head, initial encounter: Secondary | ICD-10-CM | POA: Diagnosis not present

## 2021-03-01 DIAGNOSIS — F0390 Unspecified dementia without behavioral disturbance: Secondary | ICD-10-CM | POA: Diagnosis not present

## 2021-03-01 DIAGNOSIS — Y92009 Unspecified place in unspecified non-institutional (private) residence as the place of occurrence of the external cause: Secondary | ICD-10-CM | POA: Diagnosis not present

## 2021-04-02 DIAGNOSIS — E039 Hypothyroidism, unspecified: Secondary | ICD-10-CM | POA: Diagnosis not present

## 2021-04-02 DIAGNOSIS — R7301 Impaired fasting glucose: Secondary | ICD-10-CM | POA: Diagnosis not present

## 2021-04-02 DIAGNOSIS — I10 Essential (primary) hypertension: Secondary | ICD-10-CM | POA: Diagnosis not present

## 2021-04-06 DIAGNOSIS — R7301 Impaired fasting glucose: Secondary | ICD-10-CM | POA: Diagnosis not present

## 2021-04-06 DIAGNOSIS — R7401 Elevation of levels of liver transaminase levels: Secondary | ICD-10-CM | POA: Diagnosis not present

## 2021-04-06 DIAGNOSIS — R413 Other amnesia: Secondary | ICD-10-CM | POA: Diagnosis not present

## 2021-04-06 DIAGNOSIS — H409 Unspecified glaucoma: Secondary | ICD-10-CM | POA: Diagnosis not present

## 2021-04-06 DIAGNOSIS — R3981 Functional urinary incontinence: Secondary | ICD-10-CM | POA: Diagnosis not present

## 2021-04-06 DIAGNOSIS — H903 Sensorineural hearing loss, bilateral: Secondary | ICD-10-CM | POA: Diagnosis not present

## 2021-04-06 DIAGNOSIS — E039 Hypothyroidism, unspecified: Secondary | ICD-10-CM | POA: Diagnosis not present

## 2021-04-21 DIAGNOSIS — A084 Viral intestinal infection, unspecified: Secondary | ICD-10-CM | POA: Diagnosis not present

## 2021-04-21 DIAGNOSIS — R110 Nausea: Secondary | ICD-10-CM | POA: Diagnosis not present

## 2021-04-26 DIAGNOSIS — H401231 Low-tension glaucoma, bilateral, mild stage: Secondary | ICD-10-CM | POA: Diagnosis not present

## 2021-04-29 DIAGNOSIS — I10 Essential (primary) hypertension: Secondary | ICD-10-CM | POA: Diagnosis not present

## 2021-04-29 DIAGNOSIS — E1165 Type 2 diabetes mellitus with hyperglycemia: Secondary | ICD-10-CM | POA: Diagnosis not present

## 2021-05-12 ENCOUNTER — Other Ambulatory Visit: Payer: Self-pay

## 2021-05-12 ENCOUNTER — Ambulatory Visit
Admission: RE | Admit: 2021-05-12 | Discharge: 2021-05-12 | Disposition: A | Discharge status: Home / Self Care | Payer: Medicare Other | Source: Ambulatory Visit | Attending: Internal Medicine | Admitting: Internal Medicine

## 2021-05-12 DIAGNOSIS — R922 Inconclusive mammogram: Secondary | ICD-10-CM | POA: Diagnosis not present

## 2021-05-12 DIAGNOSIS — R921 Mammographic calcification found on diagnostic imaging of breast: Secondary | ICD-10-CM

## 2021-05-24 ENCOUNTER — Ambulatory Visit
Admission: EM | Admit: 2021-05-24 | Discharge: 2021-05-24 | Disposition: A | Discharge status: Home / Self Care | Payer: Medicare Other | Attending: Family Medicine | Admitting: Family Medicine

## 2021-05-24 DIAGNOSIS — R42 Dizziness and giddiness: Secondary | ICD-10-CM

## 2021-05-24 LAB — POCT URINALYSIS DIP (MANUAL ENTRY)
Bilirubin, UA: NEGATIVE
Blood, UA: NEGATIVE
Glucose, UA: NEGATIVE mg/dL
Ketones, POC UA: NEGATIVE mg/dL
Leukocytes, UA: NEGATIVE
Nitrite, UA: NEGATIVE
Protein Ur, POC: NEGATIVE mg/dL
Spec Grav, UA: 1.02 (ref 1.010–1.025)
Urobilinogen, UA: 0.2 E.U./dL
pH, UA: 8 (ref 5.0–8.0)

## 2021-05-24 LAB — POCT FASTING CBG KUC MANUAL ENTRY: POCT Glucose (KUC): 81 mg/dL (ref 70–99)

## 2021-05-24 NOTE — Discharge Instructions (Addendum)
Hold Namenda today and tomorrow... if dizziness resolves, discuss with primary care provider options of dose change or medication change. Your work-up here today provides no explanation for the source of your dizziness. Continue to hydrate well with water. Remain out of the heat. Go to the Emergency Department if any of your symptoms worsen or do not improve.

## 2021-05-24 NOTE — ED Provider Notes (Signed)
[facility]    CSN: [id number] Arrival date & time: 05/24/21  1123      History   Chief Complaint Chief Complaint  Patient presents with   Dizziness   Weakness    HPI Megan Conley is a 85 y.o. female.   HPI Patient with a history of hypertension, bowel obstruction presents today for evaluation of dizziness and weakness.  Patient has been seen at urgent care earlier this year with similar course of symptoms without any known etiology. Patient has a history of dementia and is chronically taking Namenda. She has held off on taking the Namenda yesterday and today due to dizziness. She is not experiencing any pain with urination. Blood pressure is stable. Denies CP, HA, fever, cough, or SOB. Endorses normal eating and drinking patterns  Past Medical History:  Diagnosis Date   Bowel obstruction ([place])    Glaucoma    Hypothyroidism     Patient Active Problem List   Diagnosis Date Noted   Elevated blood pressure reading without diagnosis of hypertension 09/17/2016   Hyperglycemia 09/17/2016   Small bowel obstruction ([place]) 09/16/2016   SBO (small bowel obstruction) ([place]) 09/16/2016   Hypothyroidism 09/16/2016   Leukocytosis 09/16/2016   History of colonic polyps 08/25/2016    Past Surgical History:  Procedure Laterality Date   ABDOMINAL HYSTERECTOMY     cataract extraction both eyes     COLONOSCOPY  06/02/2011   Procedure: COLONOSCOPY;  Surgeon: Rogene Houston, MD;  [facility];  Service: Endoscopy;  Laterality: N/A;   COLONOSCOPY N/A 03/22/2017   Procedure: COLONOSCOPY;  Surgeon: Rogene Houston, MD;  [facility];  Service: Endoscopy;  Laterality: N/A;  830 - MOVED TO 5/23 @ 10:30   LAPAROTOMY N/A 08/28/2020   Procedure: EXPLORATORY LAPAROTOMY;  Surgeon: Virl Cagey, MD;  Location[facility];  Service: General;  Laterality: N/A;   LYSIS OF ADHESION N/A 08/28/2020   Procedure: LYSIS OF ADHESIONS;  Surgeon: Virl Cagey, MD;   Location: [facility];  Service: General;  Laterality: N/A;   POLYPECTOMY  03/22/2017   Procedure: POLYPECTOMY;  Surgeon: Rogene Houston, MD;  [facility];  Service: Endoscopy;;  colon    OB History     Gravida  2   Para  2   Term  2   Preterm      AB      Living  2      SAB      IAB      Ectopic      Multiple      Live Births               Home Medications    Prior to Admission medications   Medication Sig Start Date End Date Taking? Authorizing Provider  ALPHAGAN P 0.1 % SOLN Apply 1 drop to eye daily.  06/30/20   [provider]  Biotin 5000 MCG CAPS Take 5,000 mcg by mouth daily.    [provider]  Calcium-Magnesium-Vitamin D (CALCIUM 1200+D3 PO) Take 1 tablet by mouth daily.    [provider]  docusate sodium (COLACE) 100 MG capsule Take 1 capsule (100 mg total) by mouth 2 (two) times daily. 09/01/20   Kathie Dike, MD  folic acid (FOLVITE) [id number] MCG tablet Take 800 mcg by mouth daily.    [provider]  latanoprost (XALATAN) 0.005 % ophthalmic solution Place 1 drop into both eyes at bedtime. 09/06/16  [provider]  levothyroxine (SYNTHROID, LEVOTHROID) 50 MCG tablet Take 50 mcg by mouth daily before breakfast.    [provider]  memantine (NAMENDA) 5 MG tablet Take 5 mg by mouth 2 (two) times daily.    [provider]  Multiple Vitamin (MULTIVITAMIN WITH MINERALS) TABS tablet Take 1 tablet by mouth daily.    [provider]  Omega-3 Fatty Acids (FISH OIL) 1200 MG CAPS Take 1,200 mg by mouth 2 (two) times daily.    [provider]  polyethylene glycol (MIRALAX) 17 g packet Take 17 g by mouth daily as needed. 09/01/20   Kathie Dike, MD  Red Yeast Rice 600 MG CAPS Take 1,200 mg by mouth every evening.     [provider]    Family History Family History  Problem Relation Age of Onset   Lung cancer Father    Heart Problems Sister     Social  History Social History   Tobacco Use   Smoking status: Never   Smokeless tobacco: Never  Vaping Use   Vaping Use: Never used  Substance Use Topics   Alcohol use: Yes    Alcohol/week: 4.0 standard drinks    Types: 4 Glasses of wine per week   Drug use: No     Allergies   Patient has no known allergies.   Review of Systems Review of Systems Pertinent negatives listed in HPI  Physical Exam Triage Vital Signs ED Triage Vitals  Enc Vitals Group     BP 05/24/21 1131 134/72     Pulse Rate 05/24/21 1131 (!) 56     Resp 05/24/21 1131 18     Temp 05/24/21 1131 97.6 F (36.4 C)     Temp src --      SpO2 05/24/21 1131 97 %     Weight --      Height --      Head Circumference --      Peak Flow --      Pain Score 05/24/21 1129 0     Pain Loc --      Pain Edu? --      Excl. in [place]? --    No data found.  Updated Vital Signs BP 134/72   Pulse (!) 56   Temp 97.6 F (36.4 C)   Resp 18   SpO2 97%   Visual Acuity Right Eye Distance:   Left Eye Distance:   Bilateral Distance:    Right Eye Near:   Left Eye Near:    Bilateral Near:     Physical Exam BP 134/72   Pulse (!) 56   Temp 97.6 F (36.4 C)   Resp 18   SpO2 97%   General Appearance:    Alert, cooperative, no distress, appears stated age  Head:    Normocephalic, without obvious abnormality, atraumatic  Eyes:    PERRL, conjunctiva/corneas clear, EOM's intact, fundi    benign, both eyes  Ears:    Normal TM's and external ear canals, both ears  Nose:   Nares normal, septum midline, mucosa normal, no drainage    or sinus tenderness  Throat:   Lips, mucosa, and tongue normal; teeth and gums normal  Neck:   Supple, symmetrical, trachea midline, no adenopathy;    thyroid:  no enlargement/tenderness/nodules; no carotid   bruit or JVD  Back:     Symmetric, no curvature, ROM normal, no CVA tenderness  Lungs:     Clear to auscultation bilaterally, respirations unlabored  Chest Wall:    No tenderness or deformity    Heart:    Regular rate and rhythm, S1 and S2 normal, no murmur, rub   or gallop  Extremities:   Extremities normal, atraumatic, no cyanosis or edema  Pulses:   2+ and symmetric all extremities  Skin:   Skin color, texture, turgor normal, no rashes or lesions  Neurologic:   CNII-XII intact, normal strength, sensation and reflexes    throughout     UC Treatments / Results  Labs (all labs ordered are listed, but only abnormal results are displayed) Labs Reviewed  POCT URINALYSIS DIP (MANUAL ENTRY) - Normal  POCT FASTING CBG [facility] MANUAL ENTRY    EKG Sinus Bradycardia, no ST changes   Radiology No results found.  Procedures Procedures (including critical care time)  Medications Ordered in UC Medications - No data to display  Initial Impression / Assessment and Plan / UC Course  I have reviewed the triage vital signs and the nursing notes.  Pertinent labs & imaging results that were available during my care of the patient were reviewed by me and considered in my medical decision making (see chart for details).    Dizziness and giddiness , of unknown etiology. Encouraged to follow-up with PCP to discuss Namenda and dizziness. UA unremarkable. ECG no ST changes  ER if symptoms worsen or do not improve. Final Clinical Impressions(s) / UC Diagnoses   Final diagnoses:  Dizziness and giddiness     Discharge Instructions      Hold Namenda today and tomorrow... if dizziness resolves, discuss with primary care provider options of dose change or medication change. Your work-up here today provides no explanation for the source of your dizziness. Continue to hydrate well with water. Remain out of the heat. Go to the Emergency Department if any of your symptoms worsen or do not improve.     ED Prescriptions   None    PDMP not reviewed this encounter.   Scot Jun, FNP 05/25/21 2308

## 2021-05-24 NOTE — ED Triage Notes (Signed)
Pt presents with c/o weakness and light headedness since yesterday

## 2021-06-28 DIAGNOSIS — H401231 Low-tension glaucoma, bilateral, mild stage: Secondary | ICD-10-CM | POA: Diagnosis not present

## 2021-06-30 DIAGNOSIS — I10 Essential (primary) hypertension: Secondary | ICD-10-CM | POA: Diagnosis not present

## 2021-06-30 DIAGNOSIS — E1165 Type 2 diabetes mellitus with hyperglycemia: Secondary | ICD-10-CM | POA: Diagnosis not present

## 2021-07-19 DIAGNOSIS — H903 Sensorineural hearing loss, bilateral: Secondary | ICD-10-CM | POA: Diagnosis not present

## 2021-07-19 DIAGNOSIS — R3981 Functional urinary incontinence: Secondary | ICD-10-CM | POA: Diagnosis not present

## 2021-07-19 DIAGNOSIS — R7401 Elevation of levels of liver transaminase levels: Secondary | ICD-10-CM | POA: Diagnosis not present

## 2021-07-19 DIAGNOSIS — R413 Other amnesia: Secondary | ICD-10-CM | POA: Diagnosis not present

## 2021-07-19 DIAGNOSIS — R42 Dizziness and giddiness: Secondary | ICD-10-CM | POA: Diagnosis not present

## 2021-07-19 DIAGNOSIS — H409 Unspecified glaucoma: Secondary | ICD-10-CM | POA: Diagnosis not present

## 2021-07-19 DIAGNOSIS — R7301 Impaired fasting glucose: Secondary | ICD-10-CM | POA: Diagnosis not present

## 2021-07-19 DIAGNOSIS — E039 Hypothyroidism, unspecified: Secondary | ICD-10-CM | POA: Diagnosis not present

## 2021-07-29 DIAGNOSIS — H401231 Low-tension glaucoma, bilateral, mild stage: Secondary | ICD-10-CM | POA: Diagnosis not present

## 2021-07-30 DIAGNOSIS — E1165 Type 2 diabetes mellitus with hyperglycemia: Secondary | ICD-10-CM | POA: Diagnosis not present

## 2021-07-30 DIAGNOSIS — I10 Essential (primary) hypertension: Secondary | ICD-10-CM | POA: Diagnosis not present

## 2021-08-06 DIAGNOSIS — Z23 Encounter for immunization: Secondary | ICD-10-CM | POA: Diagnosis not present

## 2021-08-10 ENCOUNTER — Encounter (HOSPITAL_COMMUNITY): Payer: Self-pay | Admitting: Physical Therapy

## 2021-08-10 ENCOUNTER — Ambulatory Visit (HOSPITAL_COMMUNITY): Payer: Medicare Other | Attending: Internal Medicine | Admitting: Physical Therapy

## 2021-08-10 ENCOUNTER — Other Ambulatory Visit: Payer: Self-pay

## 2021-08-10 DIAGNOSIS — R2689 Other abnormalities of gait and mobility: Secondary | ICD-10-CM

## 2021-08-10 DIAGNOSIS — Z9181 History of falling: Secondary | ICD-10-CM | POA: Diagnosis not present

## 2021-08-10 DIAGNOSIS — M6281 Muscle weakness (generalized): Secondary | ICD-10-CM | POA: Diagnosis not present

## 2021-08-10 DIAGNOSIS — R262 Difficulty in walking, not elsewhere classified: Secondary | ICD-10-CM | POA: Diagnosis not present

## 2021-08-10 NOTE — Therapy (Signed)
Megan Conley, [place], [address] Phone: [phone]   Fax:  [phone]  Physical Therapy Evaluation  Patient Details  Name: Megan Conley MRN: [id number] Date of Birth: [birth year] Referring Provider (PT): Delaine Lame, MD  Encounter Date: 08/10/2021   PT End of Session - 08/10/21 1448     Visit Number 1    Number of Visits 1    Date for PT Re-Evaluation 08/17/21    Authorization Type medicare primary adn [employer] supplement secondary - no auth or VL    Progress Note Due on Visit 10    PT Start Time 1449    PT Stop Time 1515    PT Time Calculation (min) 26 min    Activity Tolerance Patient tolerated treatment well             Past Medical History:  Diagnosis Date   Bowel obstruction ([place])    Glaucoma    Hypothyroidism     Past Surgical History:  Procedure Laterality Date   ABDOMINAL HYSTERECTOMY     cataract extraction both eyes     COLONOSCOPY  06/02/2011   Procedure: COLONOSCOPY;  Surgeon: Rogene Houston, MD;  [facility];  Service: Endoscopy;  Laterality: N/A;   COLONOSCOPY N/A 03/22/2017   Procedure: COLONOSCOPY;  Surgeon: Rogene Houston, MD;  [facility];  Service: Endoscopy;  Laterality: N/A;  830 - MOVED TO 5/23 @ 10:30   LAPAROTOMY N/A 08/28/2020   Procedure: EXPLORATORY LAPAROTOMY;  Surgeon: Virl Cagey, MD;  [facility];  Service: General;  Laterality: N/A;   LYSIS OF ADHESION N/A 08/28/2020   Procedure: LYSIS OF ADHESIONS;  Surgeon: Virl Cagey, MD;  Location[facility];  Service: General;  Laterality: N/A;   POLYPECTOMY  03/22/2017   Procedure: POLYPECTOMY;  Surgeon: Rogene Houston, MD;  [facility];  Service: Endoscopy;;  colon    There were no vitals filed for this visit.    Subjective Assessment - 08/10/21 1456     Subjective States that her daughter says she needs therapy but she is completely independent. States she  walks family about 10 minutes around the block a day. States she takes care of her house and mows the grass.    Patient Stated Goals none    Currently in Pain? No/denies                [facility] PT Assessment - 08/10/21 0001       Assessment   Medical Diagnosis gait issues and balance issues    Referring Provider (PT) Delaine Lame, MD      Balance Screen   Has the patient fallen in the past 6 months No      [place] residence    Living Arrangements Alone    Available Help at Discharge Family    Type of [place] Two level      Prior Function   Level of [place]   Overall Cognitive Status Within Functional Limits for tasks assessed      Observation/Other Assessments   Focus on Therapeutic Outcomes (FOTO)  NA      Transfers   Five time sit to stand comments  9.3 seconds      Ambulation/Gait   Ambulation/Gait Yes    Ambulation/Gait Assistance 7: Independent  Ambulation Distance (Feet) 456 Feet    Assistive device None    Gait Pattern Within Functional Limits    Ambulation Surface Level;Indoor    Gait Comments 2MW      Balance   Balance Assessed Yes      Standardized Balance Assessment   Standardized Balance Assessment Dynamic Gait Index      Dynamic Gait Index   Level Surface Normal    Change in Gait Speed Normal    Gait with Horizontal Head Turns Normal    Gait with Vertical Head Turns Normal    Gait and Pivot Turn Normal    Step Over Obstacle Mild Impairment    Step Around Obstacles Normal    Steps Mild Impairment    Total Score 22      High Level Balance   High Level Balance Comments SLS 9 seconds on left and 8 seconds on right                        Objective measurements completed on examination: See above findings.       [place] Adult PT Treatment/Exercise - 08/10/21 0001       Exercises   Exercises Knee/Hip            Sit to stand  3x5         PT Education - 08/10/21 1544     Education Details on current presentation, on HEP, on POC. on risk of falling and staying active to maintain ability to perform I/ADLs at home and to allow paitent to continue to live independently at home.    Person(s) Educated Patient    Methods Explanation    Comprehension Verbalized understanding              PT Short Term Goals - 08/10/21 1544       PT SHORT TERM GOAL #1   Title Patient will report importance of performing exercises daily to reduce risk of falling    Time 2    Period Weeks    Status Achieved    Target Date 08/24/21                       Plan - 08/10/21 1542     Clinical Impression Statement Patient presents to therapy with diagnosis of dizziness, balance and gait issues. Patient reports no current dizziness and scored above all fall risk outcome measurements performed on this date. Educated patient in basic home program and no skilled physical therapy needs identified at this time.    Personal Factors and Comorbidities Comorbidity 1    Comorbidities hx of dizziness    Examination-Activity Limitations Locomotion Level    Stability/Clinical Decision Making Stable/Uncomplicated    Clinical Decision Making Low    Rehab Potential Good    PT Frequency One time visit    PT Duration 2 weeks    PT Treatment/Interventions ADLs/Self Care Home Management;Therapeutic exercise    PT Next Visit Plan one time visit    PT Home Exercise Plan STS, SLS             Patient will benefit from skilled therapeutic intervention in order to improve the following deficits and impairments:  Decreased balance  Visit Diagnosis: Muscle weakness (generalized)  Balance problem  History of falling  Difficulty in walking, not elsewhere classified     Problem List Patient Active Problem List   Diagnosis Date Noted   Elevated blood pressure  reading without diagnosis of hypertension 09/17/2016    Hyperglycemia 09/17/2016   Small bowel obstruction ([place]) 09/16/2016   SBO (small bowel obstruction) ([place]) 09/16/2016   Hypothyroidism 09/16/2016   Leukocytosis 09/16/2016   History of colonic polyps 08/25/2016   3:47 PM, 08/10/21 Megan Conley, DPT Physical Therapy with [facility]  [phone] office   [place], [place], [address] Phone: [phone]   Fax:  [phone]  Name: Megan Conley MRN: [id number] Date of Birth: Jun 27, 1936

## 2021-08-23 DIAGNOSIS — H401231 Low-tension glaucoma, bilateral, mild stage: Secondary | ICD-10-CM | POA: Diagnosis not present

## 2021-08-27 ENCOUNTER — Other Ambulatory Visit (HOSPITAL_COMMUNITY): Payer: Self-pay | Admitting: Family Medicine

## 2021-08-27 DIAGNOSIS — M810 Age-related osteoporosis without current pathological fracture: Secondary | ICD-10-CM | POA: Diagnosis not present

## 2021-08-27 DIAGNOSIS — R3981 Functional urinary incontinence: Secondary | ICD-10-CM | POA: Diagnosis not present

## 2021-08-27 DIAGNOSIS — R413 Other amnesia: Secondary | ICD-10-CM | POA: Diagnosis not present

## 2021-08-30 DIAGNOSIS — E1165 Type 2 diabetes mellitus with hyperglycemia: Secondary | ICD-10-CM | POA: Diagnosis not present

## 2021-08-30 DIAGNOSIS — I10 Essential (primary) hypertension: Secondary | ICD-10-CM | POA: Diagnosis not present

## 2021-09-01 ENCOUNTER — Other Ambulatory Visit: Payer: Self-pay

## 2021-09-01 ENCOUNTER — Ambulatory Visit (HOSPITAL_COMMUNITY)
Admission: RE | Admit: 2021-09-01 | Discharge: 2021-09-01 | Disposition: A | Discharge status: Home / Self Care | Payer: Medicare Other | Source: Ambulatory Visit | Attending: Family Medicine | Admitting: Family Medicine

## 2021-09-01 DIAGNOSIS — M810 Age-related osteoporosis without current pathological fracture: Secondary | ICD-10-CM | POA: Insufficient documentation

## 2021-09-29 DIAGNOSIS — E782 Mixed hyperlipidemia: Secondary | ICD-10-CM | POA: Diagnosis not present

## 2021-09-29 DIAGNOSIS — I10 Essential (primary) hypertension: Secondary | ICD-10-CM | POA: Diagnosis not present

## 2021-10-08 DIAGNOSIS — I10 Essential (primary) hypertension: Secondary | ICD-10-CM | POA: Diagnosis not present

## 2021-10-08 DIAGNOSIS — R7301 Impaired fasting glucose: Secondary | ICD-10-CM | POA: Diagnosis not present

## 2021-10-08 DIAGNOSIS — E039 Hypothyroidism, unspecified: Secondary | ICD-10-CM | POA: Diagnosis not present

## 2021-10-12 DIAGNOSIS — M810 Age-related osteoporosis without current pathological fracture: Secondary | ICD-10-CM | POA: Diagnosis not present

## 2021-10-12 DIAGNOSIS — E039 Hypothyroidism, unspecified: Secondary | ICD-10-CM | POA: Diagnosis not present

## 2021-10-12 DIAGNOSIS — R3981 Functional urinary incontinence: Secondary | ICD-10-CM | POA: Diagnosis not present

## 2021-10-12 DIAGNOSIS — R7301 Impaired fasting glucose: Secondary | ICD-10-CM | POA: Diagnosis not present

## 2021-10-12 DIAGNOSIS — H903 Sensorineural hearing loss, bilateral: Secondary | ICD-10-CM | POA: Diagnosis not present

## 2021-10-12 DIAGNOSIS — R194 Change in bowel habit: Secondary | ICD-10-CM | POA: Diagnosis not present

## 2021-10-12 DIAGNOSIS — R7401 Elevation of levels of liver transaminase levels: Secondary | ICD-10-CM | POA: Diagnosis not present

## 2021-10-12 DIAGNOSIS — H409 Unspecified glaucoma: Secondary | ICD-10-CM | POA: Diagnosis not present

## 2021-10-12 DIAGNOSIS — R413 Other amnesia: Secondary | ICD-10-CM | POA: Diagnosis not present

## 2021-11-18 DIAGNOSIS — H401231 Low-tension glaucoma, bilateral, mild stage: Secondary | ICD-10-CM | POA: Diagnosis not present

## 2022-01-11 DIAGNOSIS — R3981 Functional urinary incontinence: Secondary | ICD-10-CM | POA: Diagnosis not present

## 2022-01-11 DIAGNOSIS — R350 Frequency of micturition: Secondary | ICD-10-CM | POA: Diagnosis not present

## 2022-01-28 DIAGNOSIS — Z20822 Contact with and (suspected) exposure to covid-19: Secondary | ICD-10-CM | POA: Diagnosis not present

## 2022-02-09 DIAGNOSIS — R001 Bradycardia, unspecified: Secondary | ICD-10-CM | POA: Diagnosis not present

## 2022-02-09 DIAGNOSIS — R197 Diarrhea, unspecified: Secondary | ICD-10-CM | POA: Diagnosis not present

## 2022-02-09 DIAGNOSIS — R42 Dizziness and giddiness: Secondary | ICD-10-CM | POA: Diagnosis not present

## 2022-02-09 DIAGNOSIS — R634 Abnormal weight loss: Secondary | ICD-10-CM | POA: Diagnosis not present

## 2022-02-09 DIAGNOSIS — E559 Vitamin D deficiency, unspecified: Secondary | ICD-10-CM | POA: Diagnosis not present

## 2022-02-09 DIAGNOSIS — R5383 Other fatigue: Secondary | ICD-10-CM | POA: Diagnosis not present

## 2022-02-11 DIAGNOSIS — R5383 Other fatigue: Secondary | ICD-10-CM | POA: Diagnosis not present

## 2022-02-11 DIAGNOSIS — R634 Abnormal weight loss: Secondary | ICD-10-CM | POA: Diagnosis not present

## 2022-02-11 DIAGNOSIS — R42 Dizziness and giddiness: Secondary | ICD-10-CM | POA: Diagnosis not present

## 2022-02-24 DIAGNOSIS — I10 Essential (primary) hypertension: Secondary | ICD-10-CM | POA: Diagnosis not present

## 2022-02-24 DIAGNOSIS — M810 Age-related osteoporosis without current pathological fracture: Secondary | ICD-10-CM | POA: Diagnosis not present

## 2022-03-08 DIAGNOSIS — I10 Essential (primary) hypertension: Secondary | ICD-10-CM | POA: Diagnosis not present

## 2022-03-08 DIAGNOSIS — R001 Bradycardia, unspecified: Secondary | ICD-10-CM | POA: Diagnosis not present

## 2022-03-08 DIAGNOSIS — R42 Dizziness and giddiness: Secondary | ICD-10-CM | POA: Diagnosis not present

## 2022-03-08 DIAGNOSIS — E559 Vitamin D deficiency, unspecified: Secondary | ICD-10-CM | POA: Diagnosis not present

## 2022-03-18 ENCOUNTER — Emergency Department (HOSPITAL_COMMUNITY)
Admission: EM | Admit: 2022-03-18 | Discharge: 2022-03-18 | Disposition: A | Discharge status: Home / Self Care | Payer: Medicare Other | Attending: Emergency Medicine | Admitting: Emergency Medicine

## 2022-03-18 ENCOUNTER — Other Ambulatory Visit: Payer: Self-pay

## 2022-03-18 ENCOUNTER — Encounter (HOSPITAL_COMMUNITY): Payer: Self-pay | Admitting: *Deleted

## 2022-03-18 ENCOUNTER — Emergency Department (HOSPITAL_COMMUNITY): Payer: Medicare Other

## 2022-03-18 DIAGNOSIS — R42 Dizziness and giddiness: Secondary | ICD-10-CM | POA: Diagnosis not present

## 2022-03-18 DIAGNOSIS — I10 Essential (primary) hypertension: Secondary | ICD-10-CM | POA: Diagnosis not present

## 2022-03-18 DIAGNOSIS — Z79899 Other long term (current) drug therapy: Secondary | ICD-10-CM | POA: Diagnosis not present

## 2022-03-18 DIAGNOSIS — R4182 Altered mental status, unspecified: Secondary | ICD-10-CM | POA: Diagnosis not present

## 2022-03-18 LAB — CBC WITH DIFFERENTIAL/PLATELET
Abs Immature Granulocytes: 0.02 10*3/uL (ref 0.00–0.07)
Basophils Absolute: 0.1 10*3/uL (ref 0.0–0.1)
Basophils Relative: 1 %
Eosinophils Absolute: 0.3 10*3/uL (ref 0.0–0.5)
Eosinophils Relative: 4 %
HCT: 44.2 % (ref 36.0–46.0)
Hemoglobin: 14.4 g/dL (ref 12.0–15.0)
Immature Granulocytes: 0 %
Lymphocytes Relative: 24 %
Lymphs Abs: 1.9 10*3/uL (ref 0.7–4.0)
MCH: 32.1 pg (ref 26.0–34.0)
MCHC: 32.6 g/dL (ref 30.0–36.0)
MCV: 98.7 fL (ref 80.0–100.0)
Monocytes Absolute: 0.8 10*3/uL (ref 0.1–1.0)
Monocytes Relative: 10 %
Neutro Abs: 5 10*3/uL (ref 1.7–7.7)
Neutrophils Relative %: 61 %
Platelets: 301 10*3/uL (ref 150–400)
RBC: 4.48 MIL/uL (ref 3.87–5.11)
RDW: 12.6 % (ref 11.5–15.5)
WBC: 8.2 10*3/uL (ref 4.0–10.5)
nRBC: 0 % (ref 0.0–0.2)

## 2022-03-18 LAB — COMPREHENSIVE METABOLIC PANEL
ALT: 17 U/L (ref 0–44)
AST: 22 U/L (ref 15–41)
Albumin: 4 g/dL (ref 3.5–5.0)
Alkaline Phosphatase: 90 U/L (ref 38–126)
Anion gap: 8 (ref 5–15)
BUN: 16 mg/dL (ref 8–23)
CO2: 24 mmol/L (ref 22–32)
Calcium: 9.4 mg/dL (ref 8.9–10.3)
Chloride: 108 mmol/L (ref 98–111)
Creatinine, Ser: 0.69 mg/dL (ref 0.44–1.00)
GFR, Estimated: >60 mL/min (ref >60)
Glucose, Bld: 89 mg/dL (ref 70–99)
Potassium: 4.1 mmol/L (ref 3.5–5.1)
Sodium: 140 mmol/L (ref 135–145)
Total Bilirubin: 0.6 mg/dL (ref 0.3–1.2)
Total Protein: 7.1 g/dL (ref 6.5–8.1)

## 2022-03-18 MED ORDER — LISINOPRIL 10 MG PO TABS
10.0000 mg | ORAL_TABLET | Freq: Once | ORAL | Status: AC
  Administered 2022-03-18: 10 mg via ORAL
  Filled 2022-03-18: qty 1

## 2022-03-18 NOTE — ED Provider Notes (Addendum)
[facility] EMERGENCY DEPARTMENT Provider Note   CSN: [id number] Arrival date & time: 03/18/22  1625     History  Chief Complaint  Patient presents with   Hypertension    Megan Conley is a 86 y.o. female.  Patient with known history of hypertension followed by Dr. [name].  Several weeks ago her blood pressures got too low and there was some adjustments on her blood pressure medicine.  Patient currently taking lisinopril 20 mg once a day.  Does keep a log at home neighbor who is a retired Marine scientist states a log show that couple days ago blood pressure was like 322 systolic but the last 2 days it was up in the 025K or higher systolic.  Patient also with a complaint of some lightheadedness but no chest pain no headache no shortness of breath no neurodeficits.  It is not clear whether patient took her medicine the last 2 days.  But the patient states that she did.  They felt that when her blood pressures got too low in the past that she was doubling up on her medication so there could be some degree of confusion.  Patient does not have any family locally.  They all are in the [place] area.  I contacted Dr. Juel Burrow office.  They can get her in for an appointment today recommend she go to urgent care urgent care referred her here.  Past medical history significant for hypertension hypothyroidism.      Home Medications Prior to Admission medications   Medication Sig Start Date End Date Taking? Authorizing Provider  ALPHAGAN P 0.1 % SOLN Apply 1 drop to eye daily.  06/30/20   [provider]  Biotin 5000 MCG CAPS Take 5,000 mcg by mouth daily.    [provider]  Calcium-Magnesium-Vitamin D (CALCIUM 1200+D3 PO) Take 1 tablet by mouth daily.    [provider]  docusate sodium (COLACE) 100 MG capsule Take 1 capsule (100 mg total) by mouth 2 (two) times daily. 09/01/20   Kathie Dike, MD  folic acid (FOLVITE) 270 MCG tablet Take 800 mcg by mouth daily.     [provider]  latanoprost (XALATAN) 0.005 % ophthalmic solution Place 1 drop into both eyes at bedtime. 09/06/16   [provider]  levothyroxine (SYNTHROID, LEVOTHROID) 50 MCG tablet Take 50 mcg by mouth daily before breakfast.    [provider]  memantine (NAMENDA) 5 MG tablet Take 5 mg by mouth 2 (two) times daily.    [provider]  Multiple Vitamin (MULTIVITAMIN WITH MINERALS) TABS tablet Take 1 tablet by mouth daily.    [provider]  Omega-3 Fatty Acids (FISH OIL) 1200 MG CAPS Take 1,200 mg by mouth 2 (two) times daily.    [provider]  polyethylene glycol (MIRALAX) 17 g packet Take 17 g by mouth daily as needed. 09/01/20   Kathie Dike, MD  Red Yeast Rice 600 MG CAPS Take 1,200 mg by mouth every evening.     [provider]      Allergies    Patient has no known allergies.    Review of Systems   Review of Systems  Constitutional:  Negative for chills and fever.  HENT:  Negative for ear pain and sore throat.   Eyes:  Negative for pain and visual disturbance.  Respiratory:  Negative for cough and shortness of breath.   Cardiovascular:  Negative for chest pain and palpitations.  Gastrointestinal:  Negative for abdominal  pain and vomiting.  Genitourinary:  Negative for dysuria and hematuria.  Musculoskeletal:  Negative for arthralgias and back pain.  Skin:  Negative for color change and rash.  Neurological:  Positive for light-headedness. Negative for dizziness, seizures, syncope, facial asymmetry, speech difficulty, weakness, numbness and headaches.  All other systems reviewed and are negative.  Physical Exam Updated Vital Signs BP (!) 189/96 (BP Location: Right Arm)   Pulse (!) 55   Temp 97.8 F (36.6 C) (Oral)   Resp 20   Ht 1.588 m (5' 2.5")   Wt 48.5 kg   SpO2 100%   BMI 19.24 kg/m  Physical Exam Vitals and nursing note reviewed.  Constitutional:      General: She is not in acute distress.     Appearance: Normal appearance. She is well-developed.  HENT:     Head: Normocephalic and atraumatic.  Eyes:     Conjunctiva/sclera: Conjunctivae normal.     Pupils: Pupils are equal, round, and reactive to light.  Cardiovascular:     Rate and Rhythm: Normal rate and regular rhythm.     Heart sounds: No murmur heard. Pulmonary:     Effort: Pulmonary effort is normal. No respiratory distress.     Breath sounds: Normal breath sounds. No wheezing or rales.  Abdominal:     Palpations: Abdomen is soft.     Tenderness: There is no abdominal tenderness.  Musculoskeletal:        General: No swelling.     Cervical back: Normal range of motion and neck supple.     Right lower leg: No edema.     Left lower leg: No edema.  Skin:    General: Skin is warm and dry.     Capillary Refill: Capillary refill takes less than 2 seconds.  Neurological:     General: No focal deficit present.     Mental Status: She is alert and oriented to person, place, and time.     Cranial Nerves: No cranial nerve deficit.     Sensory: No sensory deficit.     Motor: No weakness.  Psychiatric:        Mood and Affect: Mood normal.    ED Results / Procedures / Treatments   Labs (all labs ordered are listed, but only abnormal results are displayed) Labs Reviewed  CBC WITH DIFFERENTIAL/PLATELET  COMPREHENSIVE METABOLIC PANEL    EKG None  Radiology No results found.  Procedures Procedures    Medications Ordered in ED Medications  lisinopril (ZESTRIL) tablet 10 mg (has no administration in time range)    ED Course/ Medical Decision Making/ A&P                           Medical Decision Making Amount and/or Complexity of Data Reviewed Labs: ordered. Radiology: ordered.  Risk Prescription drug management.  Blood pressure is elevated here blood pressure 189/96 on arrival.  Just was had a systolic of 449 in the room.  Based on this and the uncertainty whether patient taking her medication or not.  We  will give her here an additional 10 mg of lisinopril go and get CT head basic labs.  Neighbor said could help on the weekend to verify she takes her medication but then after that they obviously could not do this long-term.  And patient may need some help with home nurse.  Primary could set that up and also primary can follow to see how the trending is  on the blood pressures.  Patient's only complaint is some lightheadedness.  No focal neurodeficits.  Patient nontoxic no acute distress.  Labs without any significant abnormalities at all.  CBC completely normal no leukocytosis hemoglobin 14.4.  Complete metabolic panel completely normal.  Normal renal function.  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  Patient is neuro exam without any deficits at all.  Patient got additional lisinopril at about 530.  Will continue to observe to see if blood pressure improves some.  Patient nontoxic no acute distress.  Blood pressure the lowest we got was like 161 systolic.  Currently 096 systolic.  But patient again asymptomatic.  Is not clear whether she really was taking her lisinopril.  Neighbor will assist in making sure she gets her lisinopril once a day over the weekend.  And will assist in checking her blood pressure but patient knows how to check her blood pressure herself.  Then follow-up with primary care doctor.  Patient will return for any strokelike symptoms severe headache chest pain or trouble breathing.  None of those are currently present.  Final Clinical Impression(s) / ED Diagnoses Final diagnoses:  Primary hypertension    Rx / DC Orders ED Discharge Orders     None         Fredia Sorrow, MD 03/18/22 1726    Fredia Sorrow, MD 03/18/22 1842    Fredia Sorrow, MD 03/18/22 2015    Fredia Sorrow, MD 03/18/22 2016

## 2022-03-18 NOTE — ED Triage Notes (Addendum)
Pt in c/o lightheadedness and HTN with taking a BP log, pt denies CP, SOB, headache, blurred vision, Delphina Cahill is her PCP who manages her htn, she was told by his office to come to Urgent Care and was sent here, pt takes Lisinopril 20 mg,  A&O x4

## 2022-03-18 NOTE — Discharge Instructions (Addendum)
Make an appointment to follow-up with Dr. [name] for next week.  Your neighbor will assist in giving your blood pressure medicine this weekend and will assist in checking your blood pressure.  Return for significant headache, chest pain, difficulty breathing, or any strokelike symptoms.

## 2022-03-24 DIAGNOSIS — I10 Essential (primary) hypertension: Secondary | ICD-10-CM | POA: Diagnosis not present

## 2022-03-24 DIAGNOSIS — R413 Other amnesia: Secondary | ICD-10-CM | POA: Diagnosis not present

## 2022-03-24 DIAGNOSIS — R001 Bradycardia, unspecified: Secondary | ICD-10-CM | POA: Diagnosis not present

## 2022-03-24 DIAGNOSIS — H401231 Low-tension glaucoma, bilateral, mild stage: Secondary | ICD-10-CM | POA: Diagnosis not present

## 2022-03-24 DIAGNOSIS — R42 Dizziness and giddiness: Secondary | ICD-10-CM | POA: Diagnosis not present

## 2022-04-08 DIAGNOSIS — E039 Hypothyroidism, unspecified: Secondary | ICD-10-CM | POA: Diagnosis not present

## 2022-04-08 DIAGNOSIS — R7401 Elevation of levels of liver transaminase levels: Secondary | ICD-10-CM | POA: Diagnosis not present

## 2022-04-08 DIAGNOSIS — R7301 Impaired fasting glucose: Secondary | ICD-10-CM | POA: Diagnosis not present

## 2022-04-13 DIAGNOSIS — Z8673 Personal history of transient ischemic attack (TIA), and cerebral infarction without residual deficits: Secondary | ICD-10-CM | POA: Diagnosis not present

## 2022-04-13 DIAGNOSIS — R3981 Functional urinary incontinence: Secondary | ICD-10-CM | POA: Diagnosis not present

## 2022-04-13 DIAGNOSIS — R194 Change in bowel habit: Secondary | ICD-10-CM | POA: Diagnosis not present

## 2022-04-13 DIAGNOSIS — H903 Sensorineural hearing loss, bilateral: Secondary | ICD-10-CM | POA: Diagnosis not present

## 2022-04-13 DIAGNOSIS — R413 Other amnesia: Secondary | ICD-10-CM | POA: Diagnosis not present

## 2022-04-13 DIAGNOSIS — Z0001 Encounter for general adult medical examination with abnormal findings: Secondary | ICD-10-CM | POA: Diagnosis not present

## 2022-04-13 DIAGNOSIS — R7401 Elevation of levels of liver transaminase levels: Secondary | ICD-10-CM | POA: Diagnosis not present

## 2022-04-13 DIAGNOSIS — R7301 Impaired fasting glucose: Secondary | ICD-10-CM | POA: Diagnosis not present

## 2022-04-13 DIAGNOSIS — E039 Hypothyroidism, unspecified: Secondary | ICD-10-CM | POA: Diagnosis not present

## 2022-04-13 DIAGNOSIS — H409 Unspecified glaucoma: Secondary | ICD-10-CM | POA: Diagnosis not present

## 2022-04-13 DIAGNOSIS — E782 Mixed hyperlipidemia: Secondary | ICD-10-CM | POA: Diagnosis not present

## 2022-04-13 DIAGNOSIS — M810 Age-related osteoporosis without current pathological fracture: Secondary | ICD-10-CM | POA: Diagnosis not present

## 2022-05-09 DIAGNOSIS — I10 Essential (primary) hypertension: Secondary | ICD-10-CM | POA: Diagnosis not present

## 2022-05-24 DIAGNOSIS — I10 Essential (primary) hypertension: Secondary | ICD-10-CM | POA: Diagnosis not present

## 2022-05-24 DIAGNOSIS — R350 Frequency of micturition: Secondary | ICD-10-CM | POA: Diagnosis not present

## 2022-06-07 DIAGNOSIS — R5383 Other fatigue: Secondary | ICD-10-CM | POA: Diagnosis not present

## 2022-06-07 DIAGNOSIS — I10 Essential (primary) hypertension: Secondary | ICD-10-CM | POA: Diagnosis not present

## 2022-06-07 DIAGNOSIS — R42 Dizziness and giddiness: Secondary | ICD-10-CM | POA: Diagnosis not present

## 2022-07-05 DIAGNOSIS — I10 Essential (primary) hypertension: Secondary | ICD-10-CM | POA: Diagnosis not present

## 2022-07-07 ENCOUNTER — Telehealth: Payer: Self-pay

## 2022-07-07 DIAGNOSIS — R030 Elevated blood-pressure reading, without diagnosis of hypertension: Secondary | ICD-10-CM

## 2022-07-07 DIAGNOSIS — R739 Hyperglycemia, unspecified: Secondary | ICD-10-CM

## 2022-07-07 NOTE — Telephone Encounter (Signed)
   Telephone encounter was:  Successful.  07/07/2022 Name: Megan Conley MRN: [id number] DOB: Jan 10, 1936  [name] is a 86 y.o. year old female who is a primary care patient of [name], Edwinna Areola, MD . The community resource team was consulted for assistance with Caregiver Stress  Care guide performed the following interventions: Patient provided with information about care guide support team and interviewed to confirm resource needs.Patient needs resources to help with medication management and food  Follow Up Plan:  Care guide will follow up with patient by phone over the next day    [name], [place] Management  [phone] [address], [place], [place] [address] Phone: [phone] Email: [email or web address]

## 2022-07-08 ENCOUNTER — Telehealth: Payer: Self-pay

## 2022-07-08 DIAGNOSIS — I10 Essential (primary) hypertension: Secondary | ICD-10-CM | POA: Diagnosis not present

## 2022-07-08 NOTE — Telephone Encounter (Signed)
Mailing and emailing  resources for patient.   [place], Care Management  [phone] [address], [place], [place] [address] Phone: [phone] Email: [email or web address]

## 2022-07-14 ENCOUNTER — Telehealth: Payer: Self-pay

## 2022-07-14 DIAGNOSIS — R7301 Impaired fasting glucose: Secondary | ICD-10-CM | POA: Diagnosis not present

## 2022-07-14 DIAGNOSIS — E559 Vitamin D deficiency, unspecified: Secondary | ICD-10-CM | POA: Diagnosis not present

## 2022-07-14 DIAGNOSIS — E039 Hypothyroidism, unspecified: Secondary | ICD-10-CM | POA: Diagnosis not present

## 2022-07-14 DIAGNOSIS — R42 Dizziness and giddiness: Secondary | ICD-10-CM | POA: Diagnosis not present

## 2022-07-14 DIAGNOSIS — R7401 Elevation of levels of liver transaminase levels: Secondary | ICD-10-CM | POA: Diagnosis not present

## 2022-07-14 DIAGNOSIS — M810 Age-related osteoporosis without current pathological fracture: Secondary | ICD-10-CM | POA: Diagnosis not present

## 2022-07-14 NOTE — Chronic Care Management (AMB) (Signed)
  Care Coordination   Note   07/14/2022 Name: LEAHANN LEMPKE MRN: [id number] DOB: [birth year]  DENIQUA PERRY is a 86 y.o. year old female who sees [name], Edwinna Areola, MD for primary care. I reached out to [name] by phone today to offer care coordination services.  Ms. Leising was given information about Care Coordination services today including:   The Care Coordination services include support from the care team which includes your Nurse Coordinator, Clinical Social Worker, or Pharmacist.  The Care Coordination team is here to help remove barriers to the health concerns and goals most important to you. Care Coordination services are voluntary, and the patient may decline or stop services at any time by request to their care team member.   Care Coordination Consent Status: Patient agreed to services and verbal consent obtained.   Follow up plan:  Telephone appointment with care coordination team member scheduled for:  07/21/2022  Encounter Outcome:  Pt. Scheduled  Noreene Larsson, [place], [place] [address] Direct Dial: [phone] [email or web address]

## 2022-07-15 ENCOUNTER — Telehealth: Payer: Self-pay

## 2022-07-15 NOTE — Telephone Encounter (Signed)
   Telephone encounter was:  Successful.  07/15/2022 Name: HANSINI CLODFELTER MRN: [id number] DOB: 06/03/1936  AALAYSIA LIGGINS is a 86 y.o. year old female who is a primary care patient of [name], Edwinna Areola, MD . The community resource team was consulted for assistance with  rx   Care guide performed the following interventions: Patient provided with information about care guide support team and interviewed to confirm resource needs. Follow up call  Follow Up Plan:  No further follow up planned at this time. The patient has been provided with needed resources.    [place], Care Management  [phone] [address], [place], [place] [address] Phone: [phone] Email: [email or web address]

## 2022-07-21 ENCOUNTER — Encounter: Payer: Self-pay | Admitting: *Deleted

## 2022-07-21 ENCOUNTER — Ambulatory Visit: Payer: Self-pay | Admitting: *Deleted

## 2022-07-21 DIAGNOSIS — E782 Mixed hyperlipidemia: Secondary | ICD-10-CM | POA: Diagnosis not present

## 2022-07-21 DIAGNOSIS — E039 Hypothyroidism, unspecified: Secondary | ICD-10-CM | POA: Diagnosis not present

## 2022-07-21 DIAGNOSIS — R3981 Functional urinary incontinence: Secondary | ICD-10-CM | POA: Diagnosis not present

## 2022-07-21 DIAGNOSIS — H409 Unspecified glaucoma: Secondary | ICD-10-CM | POA: Diagnosis not present

## 2022-07-21 DIAGNOSIS — H903 Sensorineural hearing loss, bilateral: Secondary | ICD-10-CM | POA: Diagnosis not present

## 2022-07-21 DIAGNOSIS — M810 Age-related osteoporosis without current pathological fracture: Secondary | ICD-10-CM | POA: Diagnosis not present

## 2022-07-21 DIAGNOSIS — R7301 Impaired fasting glucose: Secondary | ICD-10-CM | POA: Diagnosis not present

## 2022-07-21 DIAGNOSIS — R7401 Elevation of levels of liver transaminase levels: Secondary | ICD-10-CM | POA: Diagnosis not present

## 2022-07-21 DIAGNOSIS — R413 Other amnesia: Secondary | ICD-10-CM | POA: Diagnosis not present

## 2022-07-21 DIAGNOSIS — I10 Essential (primary) hypertension: Secondary | ICD-10-CM | POA: Diagnosis not present

## 2022-07-21 DIAGNOSIS — R194 Change in bowel habit: Secondary | ICD-10-CM | POA: Diagnosis not present

## 2022-07-21 DIAGNOSIS — Z8673 Personal history of transient ischemic attack (TIA), and cerebral infarction without residual deficits: Secondary | ICD-10-CM | POA: Diagnosis not present

## 2022-07-21 NOTE — Patient Outreach (Signed)
Care Coordination   Initial Telephonic Visit Note   07/23/2022 Name: Megan Conley MRN: [id number] DOB: January 18, 1936  Megan Conley is a 86 y.o. year old female who sees [name], Edwinna Areola, MD for primary care. I spoke with daughter,  Debbrah Sampedro on behalf of Megan Conley by phone today. She is reported by daughter to be hard of hearing. The preferred number and e-mail in EPIC belongs to the daughter, [name]  What matters to the patients health and wellness today?  Cognitive memory concerns, note eating enough & difficulty with taking medicines correctly leading to elevated blood pressures & episodes of hypotension.  She is reported to either forget to take or take extra dose of medicine too close together.  Her daughter has tried a locked pill dispenser for 2 weeks without success as the patient was able to remove medicines from the dispenser as discovered by her sitter Patient prefers to use a chart she developed herself to manage her administration of medication. This is not working well [name], NP, [name], nurse for external CM vendor (home visits-Tuesday at 1 pm) & Dr [name] have all spoken with the patient about compliance without success per daughter & office practice manager. Referral indicated all resources exhausted and the patient is resistant to help in her home. No neurologist per daughter - Too many side effects from [place] therefore she is not taking medications for memory The patient has a history of being independent and strong willed. Her daughter reports her husband use to refer to her as "Megan Conley"  Has sitter service 3 days a week Veterinary surgeon). She lives alone. Her son (Megan Conley) and daughter Megan Conley -flight attendant) are not local. Her husband is deceased. The daughter is the primary caregiver and assists with transportation to medical visits  Other services in the home includes  Has Meals on wheel but her appetite remains poor  Has life alert devices but does  not always use the device   Social issues with [employer] has been a problem and her daughter has had to shut down accounts   [name] is assisting the patient today by transporting her to a follows up with the pcp today for labs     Goals Addressed               This Visit's Progress     Patient Stated     blood pressure within acceptable limit (THN) (pt-stated)   Not on track     Care Coordination Interventions: Evaluation of current treatment plan related to hypertension self management and patient's adherence to plan as established by provider Discussed plans with patient for ongoing care management follow up and provided patient with direct contact information for care management team Discussed complications of poorly controlled blood pressure such as heart disease, stroke, circulatory complications, vision complications, kidney impairment, sexual dysfunction Screening for signs and symptoms of depression related to chronic disease state  Assessed social determinant of health barriers      Home safety (medicines) related to memory (pt-stated)   Not on track     Care Coordination Interventions: Evaluation of current treatment plan related to home safety concerns, Medication concerns and memory concerns completed with outreach to her daughter and patient's adherence to plan as established by provider Collaborated with pcp, [facility] dementia practitioner & daughter regarding patient's home safety, memory concerns Social Work referral for Certified Dementia practitioner services after discussed with [name], daughter Discussed plans with patient for ongoing care management follow up  and provided patient with direct contact information for care management team Screening for signs and symptoms of depression related to chronic disease state  Assessed social determinant of health barriers        SDOH assessments and interventions completed:  Yes  SDOH Interventions Today    Flowsheet Row  Most Recent Value  SDOH Interventions   Food Insecurity Interventions Intervention Not Indicated  Housing Interventions Intervention Not Indicated  Transportation Interventions Intervention Not Indicated  Utilities Interventions Intervention Not Indicated        Care Coordination Interventions Activated:  Yes  Care Coordination Interventions:  Yes, provided   Follow up plan: Follow up call scheduled for 07/26/22 3 pm    Encounter Outcome:  Pt. Visit Completed   Malanie Koloski [name], RN, BSN, [place] Coordinator Office number [phone]

## 2022-07-23 NOTE — Patient Instructions (Addendum)
Visit Information  Thank you for taking time to visit with me today. Please don't hesitate to contact me if I can be of assistance to you.   Following are the goals we discussed today:   Goals Addressed               This Visit's Progress     Patient Stated     blood pressure within acceptable limit (THN) (pt-stated)   Not on track     Care Coordination Interventions: Evaluation of current treatment plan related to hypertension self management and patient's adherence to plan as established by provider Discussed plans with patient for ongoing care management follow up and provided patient with direct contact information for care management team Discussed complications of poorly controlled blood pressure such as heart disease, stroke, circulatory complications, vision complications, kidney impairment, sexual dysfunction Screening for signs and symptoms of depression related to chronic disease state  Assessed social determinant of health barriers      Home safety (medicines) related to memory (pt-stated)   Not on track     Care Coordination Interventions: Evaluation of current treatment plan related to home safety concerns, Medication concerns and memory concerns completed with outreach to her daughter and patient's adherence to plan as established by provider Collaborated with pcp, [facility] dementia practitioner & daughter regarding patient's home safety, memory concerns Social Work referral for Certified Dementia practitioner services after discussed with [name], daughter Discussed plans with patient for ongoing care management follow up and provided patient with direct contact information for care management team Screening for signs and symptoms of depression related to chronic disease state  Assessed social determinant of health barriers        Our next appointment is by telephone on 07/26/22 at 3 pm  Please call the care guide team at [phone] if you need to cancel or reschedule  your appointment.   If you are experiencing a Mental Health or [place] or need someone to talk to, please call the Suicide and Crisis Lifeline: 988 call the [place] National Suicide Prevention Lifeline: 7701306591 or TTY: (814)721-3685 TTY (865)691-8826) to talk to a trained counselor call 1-800-273-TALK (toll free, 24 hour hotline) call the [facility]: (956) 499-8134 call 911   Patient verbalizes understanding of instructions and care plan provided today and agrees to view in [place]. Active MyChart status and patient understanding of how to access instructions and care plan via MyChart confirmed with patient.     The patient has been provided with contact information for the care management team and has been advised to call with any health related questions or concerns.   [place] Lavina Hamman, RN, BSN, [place] Coordinator Office number [phone]

## 2022-07-26 ENCOUNTER — Ambulatory Visit: Payer: Self-pay

## 2022-07-26 ENCOUNTER — Ambulatory Visit: Payer: Self-pay | Admitting: *Deleted

## 2022-07-26 NOTE — Patient Outreach (Signed)
Care Coordination   Follow Up Visit Note   07/26/2022 Name: Megan Conley MRN: [id number] DOB: 29-Sep-1936  Megan Conley is a 86 y.o. year old female who sees [name], Edwinna Areola, MD for primary care. I spoke with  Megan Conley by phone today.   What matters to the patients health and wellness today?  Memory and medication management   197/94@ the time of this outreach (1520), 176/88 '@1320'$ , sbp was 207 in the morning as reported by the daughter when she spoke with [facility] Dementia practitioner, Melven Sartorius Collaboration with Dementia practitioner after her outreach to patient's daughter  Reviewed the chart introduced by dementia practitioner   Pt confirms she is aware she has memory changes. She states this it the reason she "write everything down" She is noted to remember information like how long she has lived in her home  She is noted to tell RN CM about the 197/94 value but repeated it later in the outreach because she had not remembered she had informed RN CM She stated she is willing to try any intervention offered including a chart system offered by the dementia practitioner. K Humble updated She voiced understanding of the importance of managing hypertension and the assistance of the multidisciplinary team  She agreed to further outreach from RN CM    Goals Addressed               This Visit's Progress     Patient Stated     blood pressure within acceptable limit (THN) (pt-stated)   Not on track     Care Coordination Interventions: Evaluation of current treatment plan related to hypertension self management and patient's adherence to plan as established by provider Provided education to patient re: stroke prevention, s/s of heart attack and stroke Discussed plans with patient for ongoing care management follow up and provided patient with direct contact information for care management team Discussed complications of poorly controlled blood pressure such as heart disease,  stroke, circulatory complications, vision complications, kidney impairment, sexual dysfunction Screening for signs and symptoms of depression related to chronic disease state  Assessed social determinant of health barriers Had her to take her BP during the outreach. Discussed the 197/94 value and the previous ones (207 sbp, 176/88 Assessed the most recent blood pressure values with patient. Noted an increase in systolic value from the hypotensive value obtained during her recent pcp visit. Systolic values reported today range from 176-207 today.  Confirmed with patient that she did take her Losartan today  Assessed for activity, symptoms to include chest pain, headache, any other pain source Inquired about cardiology services  Discussed hypertension and hypotension risks Discussed with the patient the multidisciplinary team to include her pcp, pcp nurse, this RN CM, [facility] dementia practitioner, her sitter, pcp pharmacy, & pcp external RN CM Collaborated with Dementia practitioner and reviewed the chart developed to assist patient. Discussed the chart with the patient Left message for pcp nurse indicating new elevations with BP since the hypotensive value during recent pcp visit Requested advice on treatment plan.       Home safety (medicines) related to memory (pt-stated)   On track     Care Coordination Interventions: Evaluation of current treatment plan related to home safety concerns, Medication concerns and memory concerns completed with outreach to her daughter and patient's adherence to plan as established by provider Collaborated with pcp, [facility] dementia practitioner & daughter regarding patient's home safety, memory concerns Discussed plans with patient for  ongoing care management follow up and provided patient with direct contact information for care management team Screening for signs and symptoms of depression related to chronic disease state  Assessed social determinant of health  barriers Spoke with patient about home safety, memory changes and medication adminstration  Discussed normal memory changes with age. Encouragement provided Discussed the importance of her pcp and clinical staff to maintain home safety Rapport developed         SDOH assessments and interventions completed:  Yes  SDOH Interventions Today    Flowsheet Row Most Recent Value  SDOH Interventions   Utilities Interventions Intervention Not Indicated  Financial Strain Interventions Intervention Not Indicated  Stress Interventions Intervention Not Indicated        Care Coordination Interventions Activated:  Yes  Care Coordination Interventions:  Yes, provided   Follow up plan: Follow up call scheduled for 08/10/22    Encounter Outcome:  Pt. Visit Completed   Ceira Hoeschen [name], RN, BSN, [place] Coordinator Office number [phone]

## 2022-07-26 NOTE — Patient Outreach (Signed)
  Care Coordination   Follow Up Visit Note   07/26/2022 Name: Megan Conley MRN: [id number] DOB: [birth year]  Megan Conley is a 86 y.o. year old female who sees [name], Edwinna Areola, MD for primary care. I  spoke with patients daughter and caregiver [name] by phone.  What matters to the patients health and wellness today?  Better manage medication administration    Goals Addressed             This Visit's Progress    Care Coordination Activities       Care Coordination Interventions: Collaboration with RN Care Manager to discuss challenges related to challenges in the home  Discussion with patients daughter [name] to review concerns with medication administration Reviewed plan for SW to mail the patient a chart to better track medication administration Encouraged [name] to ask the patients sitter to remove pills from each pill bottle, only leaving the number of pills needed until the sitter returns to reduce opportunity for patient to take more than prescribed Discussed patients blood pressure medication was recently changed from Lisinopril BID to Losartan once daily to reduce room for error due to concern of patient over medicating Since this medication change the patients blood pressure has ran very high [place] would collaborate with RN Care Manager to alert of concerns with elevated blood pressure Collaboration with RN Care Manager who will follow up with the patients daughter regarding blood pressure concerns Mailed the patient an AVS with instructions about how to utilize medication chart Scheduled follow up call over the next month     COMPLETED: Medication Tracking       Instructions: Dr. [name] would like you to track when you take your medications  I have provided you with a medication table, please have [name] help you fill this out based on when you take your medications The first page is an example of how Dr. [name] would like you to track your medications. I have  included blank pages for you to complete Please bring this chart in for your next office visit for Dr. [name] to review         SDOH assessments and interventions completed:  Yes  SDOH Interventions Today    Flowsheet Row Most Recent Value  SDOH Interventions   Food Insecurity Interventions Intervention Not Indicated  Housing Interventions Intervention Not Indicated  Transportation Interventions Intervention Not Indicated        Care Coordination Interventions Activated:  Yes  Care Coordination Interventions:  Yes, provided   Follow up plan: Follow up call scheduled for 10/18    Encounter Outcome:  Pt. Visit Completed   Daneen Schick, Arita Miss, CDP Social Worker, Certified Dementia Practitioner [place] [facility]  Care Coordination [phone]

## 2022-07-26 NOTE — Patient Instructions (Signed)
Visit Information  Following are the goals Dr. [name] wants you to work on:  Goals Addressed       Medication Tracking       Instructions: Dr. [name] would like you to track when you take your medications  I have provided you with a medication table, please have [name] help you fill this out based on when you take your medications The first page is an example of how Dr. [name] would like you to track your medications. I have included blank pages for you to complete Please bring this chart in for your next office visit for Dr. [name] to review         Print copy mailed to patients home address.  Please contact me if you have any questions.  Daneen Schick, Arita Miss, CDP Social Worker [facility] Care Management  Care Coordination [phone]

## 2022-07-26 NOTE — Patient Instructions (Signed)
Visit Information  Thank you for taking time to visit with me today. Please don't hesitate to contact me if I can be of assistance to you.   Following are the goals we discussed today:   Goals Addressed               This Visit's Progress     Patient Stated     blood pressure within acceptable limit ([facility]) (pt-stated)   Not on track     Care Coordination Interventions: Evaluation of current treatment plan related to hypertension self management and patient's adherence to plan as established by provider Provided education to patient re: stroke prevention, s/s of heart attack and stroke Discussed plans with patient for ongoing care management follow up and provided patient with direct contact information for care management team Discussed complications of poorly controlled blood pressure such as heart disease, stroke, circulatory complications, vision complications, kidney impairment, sexual dysfunction Screening for signs and symptoms of depression related to chronic disease state  Assessed social determinant of health barriers Had her to take her BP during the outreach. Discussed the 197/94 value and the previous ones (207 sbp, 176/88 Assessed the most recent blood pressure values with patient. Noted an increase in systolic value from the hypotensive value obtained during her recent pcp visit. Systolic values reported today range from 176-207 today.  Confirmed with patient that she did take her Losartan today  Assessed for activity, symptoms to include chest pain, headache, any other pain source Inquired about cardiology services  Discussed hypertension and hypotension risks Discussed with the patient the multidisciplinary team to include her pcp, pcp nurse, this RN CM, [facility] dementia practitioner, her sitter, pcp pharmacy, & pcp external RN CM Collaborated with Dementia practitioner and reviewed the chart developed to assist patient. Discussed the chart with the patient Left message for  pcp nurse indicating new elevations with BP since the hypotensive value during recent pcp visit Requested advice on treatment plan.       Home safety (medicines) related to memory (pt-stated)   On track     Care Coordination Interventions: Evaluation of current treatment plan related to home safety concerns, Medication concerns and memory concerns completed with outreach to her daughter and patient's adherence to plan as established by provider Collaborated with pcp, [facility] dementia practitioner & daughter regarding patient's home safety, memory concerns Discussed plans with patient for ongoing care management follow up and provided patient with direct contact information for care management team Screening for signs and symptoms of depression related to chronic disease state  Assessed social determinant of health barriers Spoke with patient about home safety, memory changes and medication adminstration  Discussed normal memory changes with age. Encouragement provided Discussed the importance of her pcp and clinical staff to maintain home safety R[place] developed         Our next appointment is by telephone on 08/10/22 at 1:30 pm  Please call the care guide team at [phone] if you need to cancel or reschedule your appointment.   If you are experiencing a Mental Health or [place] or need someone to talk to, please call the Suicide and Crisis Lifeline: 988 call the [place] National Suicide Prevention Lifeline: 434-126-8453 or TTY: (986)039-1636 TTY 763-364-3824) to talk to a trained counselor call 1-800-273-TALK (toll free, 24 hour hotline) call the [facility]: [phone] call 911   Patient verbalizes understanding of instructions and care plan provided today and agrees to view in [place]. Active MyChart status and  patient understanding of how to access instructions and care plan via MyChart confirmed with patient.     The patient has been provided  with contact information for the care management team and has been advised to call with any health related questions or concerns.    [place] Lavina Hamman, RN, BSN, [place] Coordinator Office number [phone]

## 2022-08-10 ENCOUNTER — Ambulatory Visit: Payer: Self-pay | Admitting: *Deleted

## 2022-08-10 NOTE — Patient Outreach (Addendum)
Care Coordination   Follow Up Visit Note   08/12/2022 Name: Megan Conley MRN: [id number] DOB: Apr 10, 1936  Megan Conley is a 86 y.o. year old female who sees [name], Edwinna Areola, MD for primary care. I spoke with  [name], daughter of Megan Conley (memory loss Megan Conley) by phone today.  What matters to the patients health and wellness today? [name] reports "things going better" except Megan Conley started taking her Losartan as it is indicated on her recent refill (twice a day) and the label is not correct. [name] confirms during the last PCP visit, the Losartan was changed to Losartan 50 once a day to help Megan Conley to not forget her medicine Megan Conley had notably observed to be physically better. Megan Conley continues to use her personal chart/notebook and not one recommended by [facility] dementia practitioner, Ann Maki confirms on 08/08/22, Monday mid day, the sitter, [name] notice the error on the recently filled Losartan bottle Megan [name] after picking up her new Losartan refill, went out of town with friends during the weekend  [name] reports when she called the Megan Conley to check in she reported not feeling well "weak and woozy" [name] had her to check her blood pressure and it ranged 130/77 to 188/95. Denied chest pain, fever and headache [name] questioned if the Megan Conley had eaten   [name] reached out Dr Juel Burrow office to discuss the discrepancy noted on the medicine bottle  Follow up on August 18 2022 with the pcp is scheduled  [name] agreed to allow RN CM to outreach to [facility] and [facility] to clarify the correct Losartan dose so [facility] can provide a correct Losartan label for the bottle. [name], the sitter,  can pick up the label and place it on the Losartan bottle   Hypertension  The blood pressures are still reported to be high but not in the 200 's. The Systolic ranges from 109-323'F, the diastolic remains in the high 80's  Social Still getting scammed every 2 weeks per [name]. The bank  assists     Goals Addressed               This Visit's Progress     Megan Conley Stated     blood pressure within acceptable limit [facility]) (pt-stated)   Not on track     Care Coordination Interventions: Evaluation of current treatment plan related to hypertension self management and Megan Conley's adherence to plan as established by provider Discussed plans with Megan Conley for ongoing care management follow up and provided Megan Conley with direct contact information for care management team Confirmed the blood pressures are still reported to be high but not in the 200 's. The Systolic ranges from 573-220'U, the diastolic remains in the high 80's       Home safety (medicines) related to memory (pt-stated)   Not on track     Care Coordination Interventions: Evaluation of current treatment plan related to home safety concerns, Medication concerns and memory concerns completed with outreach to her daughter and Megan Conley's adherence to plan as established by provider Collaborated with pcp, [facility] dementia practitioner & daughter regarding Megan Conley's home safety, memory concerns Discussed plans with Megan Conley for ongoing care management follow up and provided Megan Conley with direct contact information for care management team Spoke with daughter to get update on Megan Conley's progress with taking medications as ordered. Discussed the discrepancy on the Losartan label  Confirmed Megan Conley with a follow up visit to Dr [name] on 08/18/22  Outreach to Dr [name] office to discuss  the Losartan medicine bottle discrepancy, [facility]. Confirmed with [name] that Dr Juel Burrow nurse called in a new prescription today around 4 pm on 08/10/22 [facility] to have the medicine label corrected to once a day  Sp[facility] with [name] [facility] [address] to review the discrepancy and to request assistance with a new Losartan 50 mg once a day label for the Megan Conley's sitter, [name], to pick up possible with  the Megan Conley  Updated [name], Daughter on new Losartan label the sitter can pick up from [place]. Brainstormed some possible interventions related to the scamming concern. Discussed consistency and simplicity interventions (medicines once a day, re routing land line incoming calls during times sitter/family not - in evenings when most scam calls start, setting up a separate bank account containing only a small sum of money and a credit card with a low spending limit. Suggest having Megan Conley chart her food intake so she can recognize when she has not eaten) Provided [facility] 24 hour nurse number and RN CM office number to [name] Encouraged collaboration with [facility] dementia Practitioner.        SDOH assessments and interventions completed:  Yes     Care Coordination Interventions Activated:  Yes  Care Coordination Interventions:  Yes, provided   Follow up plan: Follow up call scheduled for 08/24/22 2 pm     Encounter Outcome:  Pt. Visit Completed   [name] [name], RN, BSN, [place] Coordinator Office number [phone]

## 2022-08-12 NOTE — Patient Instructions (Signed)
Visit Information  Thank you for taking time to visit with me today. Please don't hesitate to contact me if I can be of assistance to you.   [facility] 24 hour nurse call center number is [phone] for after office hours, holidays & weekends   Following are the goals we discussed today:   Goals Addressed               This Visit's Progress     Patient Stated     blood pressure within acceptable limit [facility]) (pt-stated)   Not on track     Care Coordination Interventions: Evaluation of current treatment plan related to hypertension self management and patient's adherence to plan as established by provider Discussed plans with patient for ongoing care management follow up and provided patient with direct contact information for care management team Confirmed the blood pressures are still reported to be high but not in the 200 's. The Systolic ranges from 315-400'Q, the diastolic remains in the high 80's       Home safety (medicines) related to memory (pt-stated)   Not on track     Care Coordination Interventions: Evaluation of current treatment plan related to home safety concerns, Medication concerns and memory concerns completed with outreach to her daughter and patient's adherence to plan as established by provider Collaborated with pcp, [facility] dementia practitioner & daughter regarding patient's home safety, memory concerns Discussed plans with patient for ongoing care management follow up and provided patient with direct contact information for care management team Spoke with daughter to get update on patient's progress with taking medications as ordered. Discussed the discrepancy on the Losartan label  Confirmed patient with a follow up visit to Dr [name] on 08/18/22  Outreach to Dr [name] office to discuss the Losartan medicine bottle discrepancy, [facility]. Confirmed with [name] that Dr Juel Burrow nurse called in a new prescription today around 4 pm  on 08/10/22 to [facility]) to have the medicine label corrected to once a day  Sp[facility] with [name] [facility] to review the discrepancy and to request assistance with a new Losartan 50 mg once a day label for the patient's sitter, [name], to pick up possible with the patient  Updated [name], Daughter on new Losartan label the sitter can pick up from [place]. Brainstormed some possible interventions related to the scamming concern. Discussed consistency and simplicity interventions (medicines once a day, re routing land line incoming calls during times sitter/family not - in evenings when most scam calls start, setting up a separate bank account containing only a small sum of money and a credit card with a low spending limit. Suggest having patient chart her food intake so she can recognize when she has not eaten) Provided [facility] 24 hour nurse number and RN CM office number to [name] Encouraged collaboration with [facility] dementia Practitioner.        Our next appointment is by telephone on 08/24/22 at 2 pm  Please call the care guide team at [phone] if you need to cancel or reschedule your appointment.   If you are experiencing a Mental Health or [place] or need someone to talk to, please call the Suicide and Crisis Lifeline: 988 call the [place] National Suicide Prevention Lifeline: 217 368 0366 or TTY: (640)200-7177 TTY 7276658870) to talk to a trained counselor call 1-800-273-TALK (toll free, 24 hour hotline) call the [facility]: ([phone] call 911  Patient verbalizes understanding of instructions and care plan provided today and agrees to view in [place]. Active MyChart status and patient understanding of how to access instructions and care plan via MyChart confirmed with patient.     The patient has been provided with contact information for the care management team and has been advised to call with any health related questions or concerns.    Camyla Camposano [name], RN, BSN, [place] Coordinator Office number [phone]

## 2022-08-17 ENCOUNTER — Ambulatory Visit: Payer: Self-pay

## 2022-08-17 NOTE — Patient Instructions (Signed)
Visit Information  Thank you for taking time to visit with me today. Please don't hesitate to contact me if I can be of assistance to you.   Following are the goals we discussed today:   Goals Addressed             This Visit's Progress    COMPLETED: Care Coordination Activities       Care Coordination Interventions: Determined the patient has a primary care provider follow up scheduled for 10/19 to follow up on blood pressure Discussed the patient is doing better with medication considering it is once per day and she has a caregiver to come 7 days per week to assist Reviewed the patient continues to get scammed by giving her bank account information. The patients bank does have her account flagged and is stopping payments but it does trigger a banking fee Brainstormed options to reduce scam calls including changing the patients number and/or switching monies into a separate bank account the patient does not have access to Education provided on the importance of implementing interventions even if the patient does not want to considering she is unable to manage monies effectively Determined [name] will consider changing the patients number and will also contact the bank to determine if there are other options to implement  Discussed the patients drivers license will need to be renewed in December and [name] does not intend to assist with renewal as she does not feel the patient needs to continue driving Reviewed the importance of also removing the patients keys and car to prevent her from driving as the patient may not recall if her drivers license is valid or not Instructed [name] to discuss concerns with patients ability to drive with her primary care provider Encouraged [name] to contact SW as needed        If you are experiencing a [place] or [place] or need someone to talk to, please call 1-800-273-TALK (toll free, 24 hour hotline)  Patient verbalizes  understanding of instructions and care plan provided today and agrees to view in [place]. Active MyChart status and patient understanding of how to access instructions and care plan via MyChart confirmed with patient.     No further follow up required: Please contact me as needed.  Daneen Schick, BSW, CDP Social Worker, Certified Dementia Practitioner [place] [facility]  Care Coordination [phone]

## 2022-08-17 NOTE — Patient Outreach (Signed)
  Care Coordination   Follow Up Visit Note   08/17/2022 Name: Megan Conley MRN: [id number] DOB: 17-Jan-1936  Megan Conley is a 86 y.o. year old female who sees [name], Edwinna Areola, MD for primary care. I  spoke with patients daughter and caregiver Brylea Pita.  What matters to the patients health and wellness today?  To prevent scam calls    Goals Addressed             This Visit's Progress    COMPLETED: Care Coordination Activities       Care Coordination Interventions: Determined the patient has a primary care provider follow up scheduled for 10/19 to follow up on blood pressure Discussed the patient is doing better with medication considering it is once per day and she has a caregiver to come 7 days per week to assist Reviewed the patient continues to get scammed by giving her bank account information. The patients bank does have her account flagged and is stopping payments but it does trigger a banking fee Brainstormed options to reduce scam calls including changing the patients number and/or switching monies into a separate bank account the patient does not have access to Education provided on the importance of implementing interventions even if the patient does not want to considering she is unable to manage monies effectively Determined [name] will consider changing the patients number and will also contact the bank to determine if there are other options to implement  Discussed the patients drivers license will need to be renewed in December and [name] does not intend to assist with renewal as she does not feel the patient needs to continue driving Reviewed the importance of also removing the patients keys and car to prevent her from driving as the patient may not recall if her drivers license is valid or not Instructed [name] to discuss concerns with patients ability to drive with her primary care provider Encouraged [name] to contact SW as needed        SDOH assessments and  interventions completed:  No     Care Coordination Interventions Activated:  Yes  Care Coordination Interventions:  Yes, provided   Follow up plan: No further intervention required.   Encounter Outcome:  Pt. Visit Completed   Daneen Schick, BSW, CDP Social Worker, Certified Dementia Practitioner [place] [facility]  Care Coordination [phone]

## 2022-08-18 DIAGNOSIS — R413 Other amnesia: Secondary | ICD-10-CM | POA: Diagnosis not present

## 2022-08-18 DIAGNOSIS — H409 Unspecified glaucoma: Secondary | ICD-10-CM | POA: Diagnosis not present

## 2022-08-18 DIAGNOSIS — E782 Mixed hyperlipidemia: Secondary | ICD-10-CM | POA: Diagnosis not present

## 2022-08-18 DIAGNOSIS — Z23 Encounter for immunization: Secondary | ICD-10-CM | POA: Diagnosis not present

## 2022-08-18 DIAGNOSIS — H903 Sensorineural hearing loss, bilateral: Secondary | ICD-10-CM | POA: Diagnosis not present

## 2022-08-18 DIAGNOSIS — R7401 Elevation of levels of liver transaminase levels: Secondary | ICD-10-CM | POA: Diagnosis not present

## 2022-08-18 DIAGNOSIS — R3981 Functional urinary incontinence: Secondary | ICD-10-CM | POA: Diagnosis not present

## 2022-08-18 DIAGNOSIS — R7301 Impaired fasting glucose: Secondary | ICD-10-CM | POA: Diagnosis not present

## 2022-08-18 DIAGNOSIS — E039 Hypothyroidism, unspecified: Secondary | ICD-10-CM | POA: Diagnosis not present

## 2022-08-18 DIAGNOSIS — M810 Age-related osteoporosis without current pathological fracture: Secondary | ICD-10-CM | POA: Diagnosis not present

## 2022-08-18 DIAGNOSIS — R42 Dizziness and giddiness: Secondary | ICD-10-CM | POA: Diagnosis not present

## 2022-08-18 DIAGNOSIS — R194 Change in bowel habit: Secondary | ICD-10-CM | POA: Diagnosis not present

## 2022-08-18 DIAGNOSIS — I10 Essential (primary) hypertension: Secondary | ICD-10-CM | POA: Diagnosis not present

## 2022-08-24 ENCOUNTER — Ambulatory Visit: Payer: Self-pay | Admitting: *Deleted

## 2022-08-24 DIAGNOSIS — H401233 Low-tension glaucoma, bilateral, severe stage: Secondary | ICD-10-CM | POA: Diagnosis not present

## 2022-08-24 NOTE — Patient Instructions (Addendum)
Visit Information  Thank you for taking time to visit with me today. Please don't hesitate to contact me if I can be of assistance to you.   Following are the goals we discussed today:   Goals Addressed               This Visit's Progress     Patient Stated     blood pressure within acceptable limit (THN) (pt-stated)        Care Coordination Interventions: Evaluation of current treatment plan related to hypertension self management and patient's adherence to plan as established by provider Provided education to patient re: stroke prevention, s/s of heart attack and stroke Reviewed medications with patient and discussed importance of compliance Discussed plans with patient for ongoing care management follow up and provided patient with direct contact information for care management team Reviewed scheduled/upcoming provider appointments including:  Discussed complications of poorly controlled blood pressure such as heart disease, stroke, circulatory complications, vision complications, kidney impairment, sexual dysfunction Confirmed the blood pressures are still reported to be high at various moments of the day (records keep in her notebook log) but not in the 200 's.  Sent in after summary visit information on blood pressure (articles & videos)  & how to eat less salt      Home safety (medicines) related to memory [facility]) (pt-stated)   On track     Care Coordination Interventions: Evaluation of current treatment plan related to home safety concerns, Medication concerns and memory concerns completed with outreach to her daughter and patient's adherence to plan as established by provider Reviewed medications with patient and discussed cardiology, anticoagulants Discussed plans with patient for ongoing care management follow up and provided patient with direct contact information for care management team Spoke with daughter to get update on patient's progress with taking medications as  ordered.  She is reported to be doing better with medication intake once a day  Reminded of the [facility] 24 hour nurse number Reviewed the collaboration with [facility] dementia Practitioner.        Our next appointment is by telephone on 09/19/22 at 3:30 pm  Please call the care guide team at [phone] if you need to cancel or reschedule your appointment.   If you are experiencing a Mental Health or [place] or need someone to talk to, please call the Suicide and Crisis Lifeline: 988 call the [place] National Suicide Prevention Lifeline: 2624893539 or TTY: (520)115-0896 TTY 684-499-5353) to talk to a trained counselor call 1-800-273-TALK (toll free, 24 hour hotline) call the [facility]: [phone] call 911   Patient verbalizes understanding of instructions and care plan provided today and agrees to view in [place]. Active MyChart status and patient understanding of how to access instructions and care plan via MyChart confirmed with patient.     The patient has been provided with contact information for the care management team and has been advised to call with any health related questions or concerns.   Kamiah Fite [name], RN, BSN, [place] Coordinator Office number [phone]

## 2022-08-24 NOTE — Patient Outreach (Signed)
  Care Coordination   Follow Up Visit Note   08/24/2022 Name: Megan Conley MRN: [id number] DOB: [birth year]  Megan Conley is a 86 y.o. year old female who sees [name], Edwinna Areola, MD for primary care. I spoke with  Megan Conley, Daughter & Megan Conley by phone today.  What matters to the patients health and wellness today?  Continued elevations blood values daily, presently at [facility], The doctor visit (Dr Candelaria Celeste) , continued daily phone calls from scammers The patient is reported to be doing better with medication intake once a day   Inquiry about patient's cardiologist, anticoagulant use, other testing for a possible source of blood pressure elevations- Patient does not have either  Last primary care provider (PCP) office visit on 08/18/22 Daughter confirm follow requested in 4 months, in February 2024   Goals Addressed               This Visit's Progress     Patient Stated     blood pressure within acceptable limit (THN) (pt-stated)        Care Coordination Interventions: Evaluation of current treatment plan related to hypertension self management and patient's adherence to plan as established by provider Provided education to patient re: stroke prevention, s/s of heart attack and stroke Reviewed medications with patient and discussed importance of compliance Discussed plans with patient for ongoing care management follow up and provided patient with direct contact information for care management team Reviewed scheduled/upcoming provider appointments including:  Discussed complications of poorly controlled blood pressure such as heart disease, stroke, circulatory complications, vision complications, kidney impairment, sexual dysfunction Confirmed the blood pressures are still reported to be high at various moments of the day (records keep in her notebook log) but not in the 200 's.  Sent in after summary visit information on blood pressure (articles & videos)  & how to eat  less salt      Home safety (medicines) related to memory [facility]) (pt-stated)   On track     Care Coordination Interventions: Evaluation of current treatment plan related to home safety concerns, Medication concerns and memory concerns completed with outreach to her daughter and patient's adherence to plan as established by provider Reviewed medications with patient and discussed cardiology, anticoagulants Discussed plans with patient for ongoing care management follow up and provided patient with direct contact information for care management team Spoke with daughter to get update on patient's progress with taking medications as ordered.  She is reported to be doing better with medication intake once a day  Reminded of the [facility] 24 hour nurse number Reviewed the collaboration with [facility] dementia Practitioner.        SDOH assessments and interventions completed:  No     Care Coordination Interventions Activated:  Yes  Care Coordination Interventions:  Yes, provided   Follow up plan: Follow up call scheduled for 09/19/22 3:30 pm     Encounter Outcome:  Pt. Visit Completed   Leler Brion [name], RN, BSN, [place] Coordinator Office number [phone]

## 2022-09-19 ENCOUNTER — Ambulatory Visit: Payer: Self-pay | Admitting: *Deleted

## 2022-09-19 NOTE — Patient Outreach (Signed)
  Care Coordination   09/19/2022 Name: Megan Conley MRN: [id number] DOB: 11-05-1935   Care Coordination Outreach Attempts:  An unsuccessful telephone outreach was attempted today to offer the patient information about available care coordination services as a benefit of their health plan.   Called laura Daughter  Follow Up Plan:  Additional outreach attempts will be made to offer the patient care coordination information and services.   Encounter Outcome:  No Answer  Care Coordination Interventions Activated:  No   Care Coordination Interventions:  No, not indicated     Slyvester Latona [name], RN, BSN, [place] Coordinator Office number [phone]

## 2022-09-30 ENCOUNTER — Ambulatory Visit: Payer: Self-pay | Admitting: *Deleted

## 2022-09-30 NOTE — Patient Outreach (Signed)
  Care Coordination   09/30/2022 Name: Megan Conley MRN: [id number] DOB: 1936/09/07   Care Coordination Outreach Attempts:  A second unsuccessful outreach was attempted today to offer the patient with information about available care coordination services as a benefit of their health plan.     Follow Up Plan:  Additional outreach attempts will be made to offer the patient care coordination information and services.   Encounter Outcome:  No Answer   Care Coordination Interventions:  No, not indicated      Jesiah Yerby [name], RN, BSN, [place] Coordinator Office number [phone]

## 2022-09-30 NOTE — Patient Instructions (Addendum)
Visit Information  Thank you for taking time to visit with me today. Please don't hesitate to contact me if I can be of assistance to you.   cardiovascular [address] [place], [place] [address] [phone] Dr Dellia Cloud, vishnu-  New Medicare patients need a referral    Neurology Medicare.gov lists these providers. When called today all offices are closed Dr Roland Rack, Dr Signe Colt, Dr Ventura Sellers  At [place] [phone]   Primary care provider- (some)   [place] [facility]- [address] [place]  [place] [facility] [place] Dr Thersa [address], Mallie Snooks Cox, Dr Kavin Leech Wolfgang [place] Dr Reinaldo Meeker  Dr Rosita Fire  [address] [place] [address] [place] 9564  [facility] medical  [address] [place] ext [address] [place],  [place]  [place] [address] [place] 5608  Following are the goals we discussed today:   Goals Addressed               This Visit's Progress     Patient Stated     blood pressure within acceptable limit (THN) (pt-stated)   On track     Care Coordination Interventions:  Assess for blood pressure changes/improvements Second unsuccessful outreach Routed all [facility] notes to pcp  Sent an email to pcp office manager related to referral to cardiovascular Conference call to [facility] to  discussed discontinuation of Oxybutynin Answered questions      Home safety (medicines) related to memory [facility]) (pt-stated)   Not on track     Care Coordination Interventions: Assessed for changes/improvement for home safety Reviewed chart [facility] care guide assisted with food resources and closed on 07/15/22 Routed all [facility] notes to pcp  Confirmed the patient continues to lose money related to scammers Discussed neurology services as an option for continued memory concerns Sent an email to pcp office manager related to referral to neurology Answered questions about patient coverage Conference with daughter to pharmacy to discuss the discontinued oxybutynin        Our next appointment is by telephone on 10/04/22 at 3 pm  Please call the care guide team at [phone] if you need to cancel or reschedule your appointment.   If you are experiencing a Mental Health or [place] or need someone to talk to, please call the Suicide and Crisis Lifeline: 988 call the [place] National Suicide Prevention Lifeline: 917-550-3763 or TTY: 253-767-9452 TTY 8432903892) to talk to a trained counselor call 1-800-273-TALK (toll free, 24 hour hotline) call the [facility]: [phone] call 911   Patient verbalizes understanding of instructions and care plan provided today and agrees to view in [place]. Active MyChart status and patient understanding of how to access instructions and care plan via MyChart confirmed with patient.     The patient has been provided with contact information for the care management team and has been advised to call with any health related questions or concerns.   Megan Conley [name], RN, BSN, [place] Coordinator Office number [phone]

## 2022-09-30 NOTE — Patient Outreach (Signed)
  Care Coordination   Follow Up Visit Note   09/30/2022 Name: [name] MRN: [id number] DOB: 12/26/1935  Megan Conley is a 86 y.o. year old female who sees [name], Edwinna Areola, MD for primary care. I spoke with  [name] by phone today.  What matters to the patients health and wellness today?  Keeping a record of Blood pressures for Dr [name] She continues to have various blood pressures 893/810-175/102 systolic  The administration of taking the hypertension medicine once a day is going very well.  Mrs Claudio continues to be independent at home   [name] voices concern about Mrs Burmaster upcoming license renewal  Mrs Macho continues to have issues with money management/scammers. The bank account had to be closed   Medication Concern [place] Meth ordered the patient Oxybutynin in which Dr [name] discontinued   Goals Addressed               This Visit's Progress     Patient Stated     blood pressure within acceptable limit (THN) (pt-stated)   On track     Care Coordination Interventions:  Assess for blood pressure changes/improvements Second unsuccessful outreach Routed all [facility] notes to pcp  Sent an email to pcp office manager related to referral to cardiovascular [facility] to discussed discontinuation of Oxybutynin Answered questions      Home safety (medicines) related to memory [facility]) (pt-stated)   Not on track     Care Coordination Interventions: Assessed for changes/improvement for home safety Reviewed chart [facility] care guide assisted with food resources and closed on 07/15/22 Routed all [facility] notes to pcp  Confirmed the patient continues to lose money related to scammers Discussed neurology services as an option for continued memory concerns Sent an email to pcp office manager related to referral to neurology Answered questions about patient coverage Conference with daughter to pharmacy to discuss the discontinued oxybutynin         SDOH assessments and interventions completed:  No     Care Coordination Interventions:  Yes, provided   Follow up plan: Follow up call scheduled for 10/04/22    Encounter Outcome:  Pt. Visit Completed   Mitsugi Schrader [name], RN, BSN, [place] Coordinator Office number [phone]

## 2022-10-03 IMAGING — CT CT ABD-PELV W/ CM
2 of 5 series · 15 of 46 positions shown, 17 images · IV contrast (Omnipaque or Isovue)
Comparison: None.

CLINICAL DATA: Lower abdominal pain.

EXAM:
CT ABDOMEN AND PELVIS WITH CONTRAST
TECHNIQUE: Multidetector CT imaging of the abdomen and pelvis was performed
using the standard protocol following bolus administration of
intravenous contrast.
CONTRAST:  75mL OMNIPAQUE IOHEXOL 300 MG/ML  SOLN

[Series 2: axial st · axial · 0.62mm/px · z∈[+800,+1195]mm · 12 of 89 slices shown, 14 images]
[im 5/89  soft-tissue]
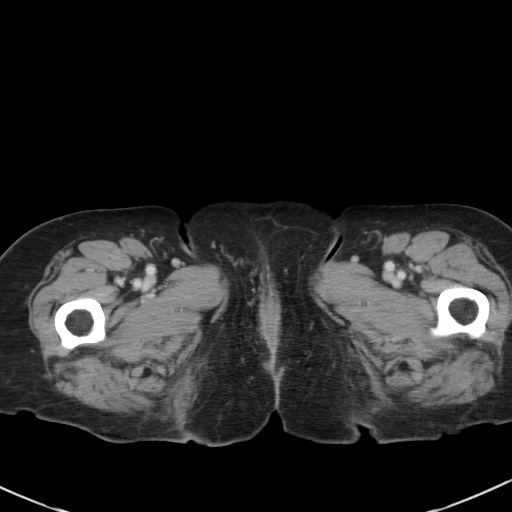
[im 5/89  bone]
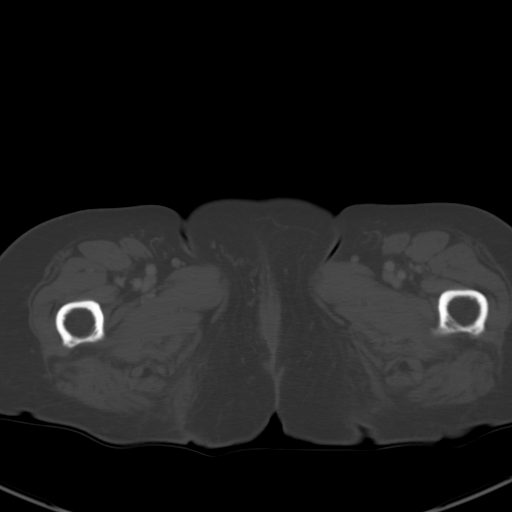
[im 15/89  soft-tissue]
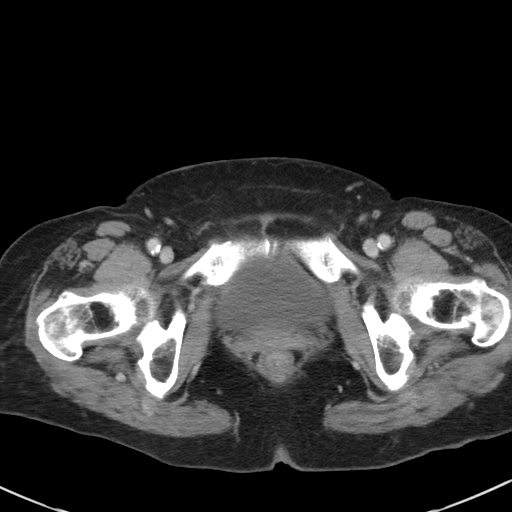
[im 20/89  soft-tissue]
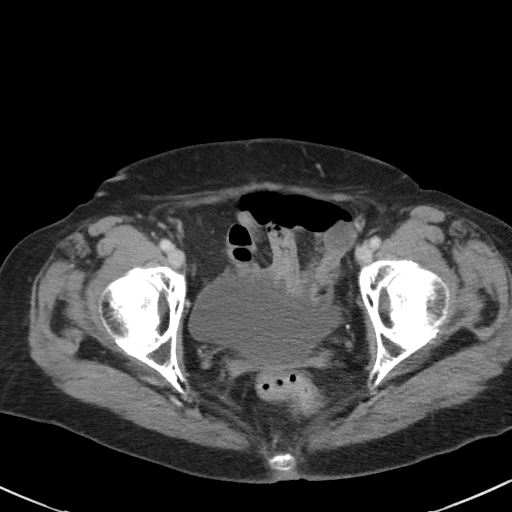
[im 25/89  soft-tissue]
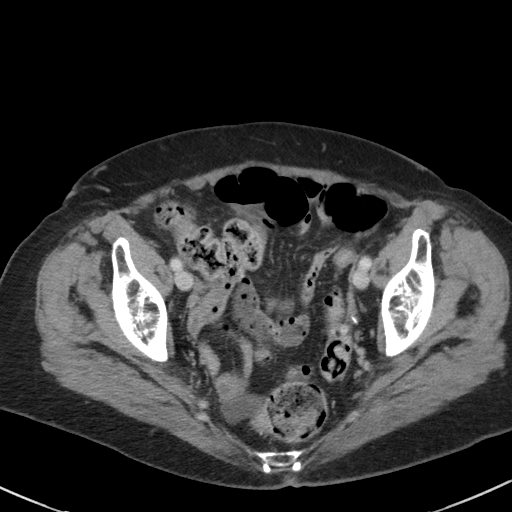
[im 35/89  soft-tissue]
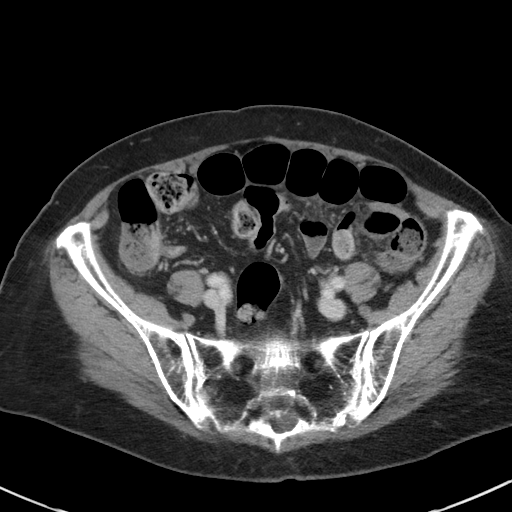
[im 40/89  soft-tissue]
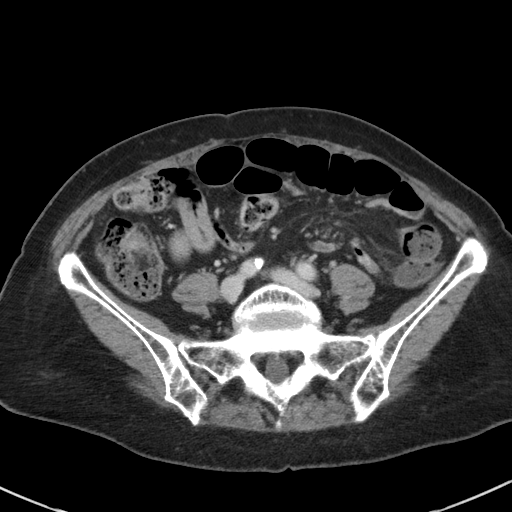
[im 49/89  soft-tissue]
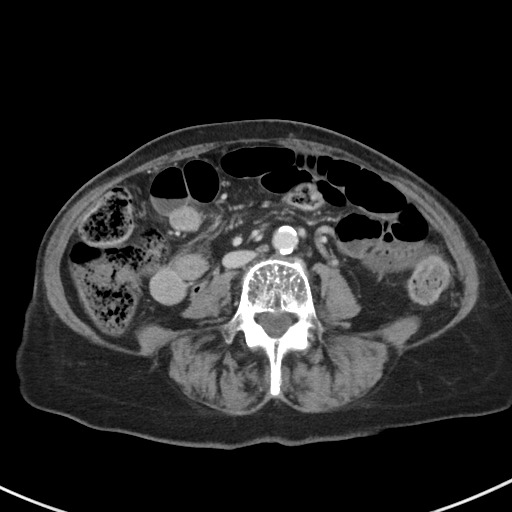
[im 54/89  soft-tissue]
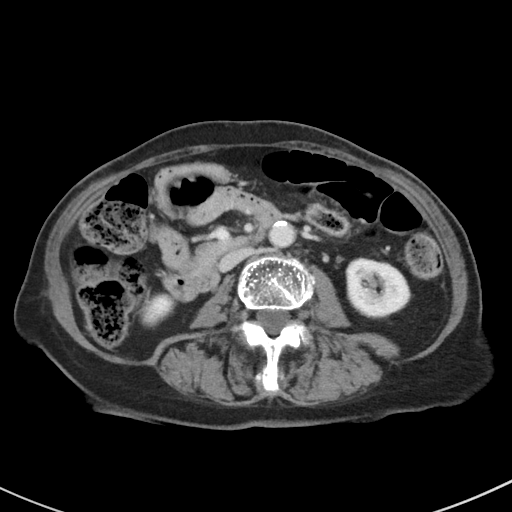
[im 64/89  soft-tissue]
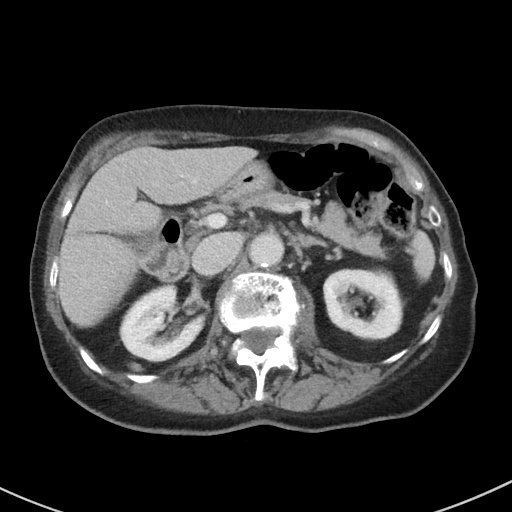
[im 64/89  bone]
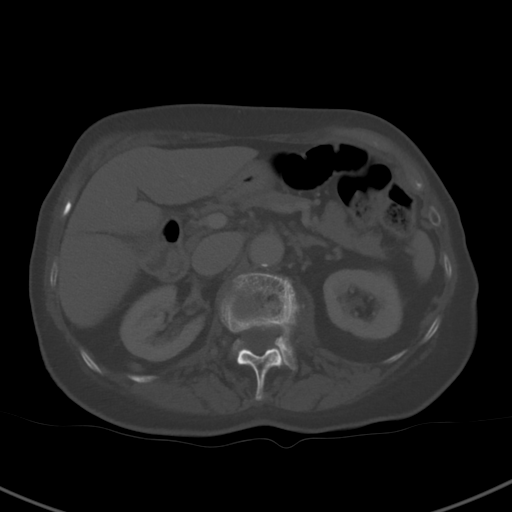
[im 69/89  soft-tissue]
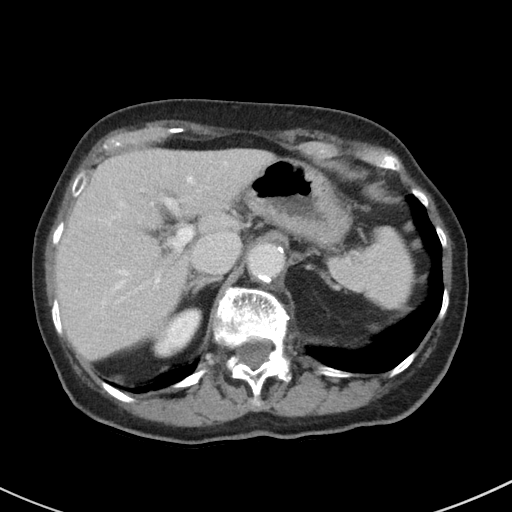
[im 74/89  soft-tissue]
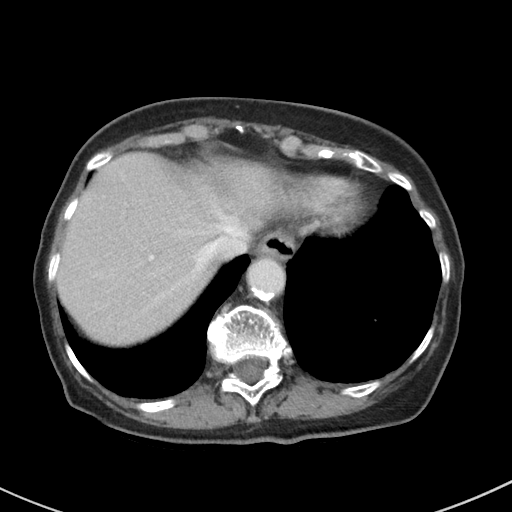
[im 84/89  soft-tissue]
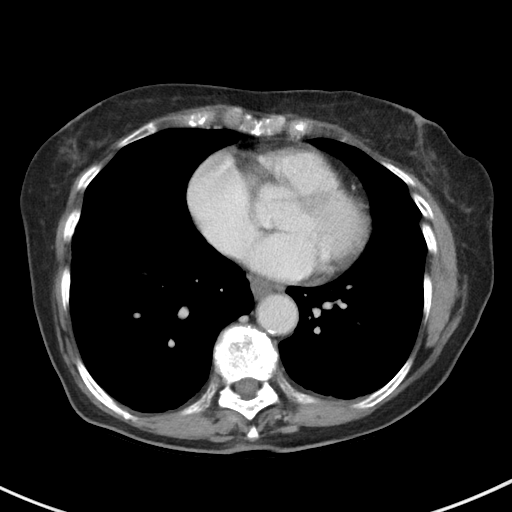

[Series 6: coronal st · coronal · 0.63mm/px · 3 of 85 slices shown]
[im 29/85  soft-tissue]
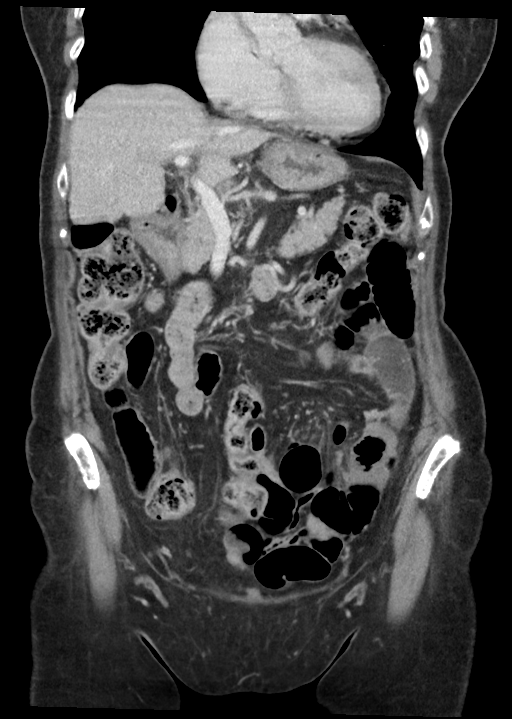
[im 38/85  soft-tissue]
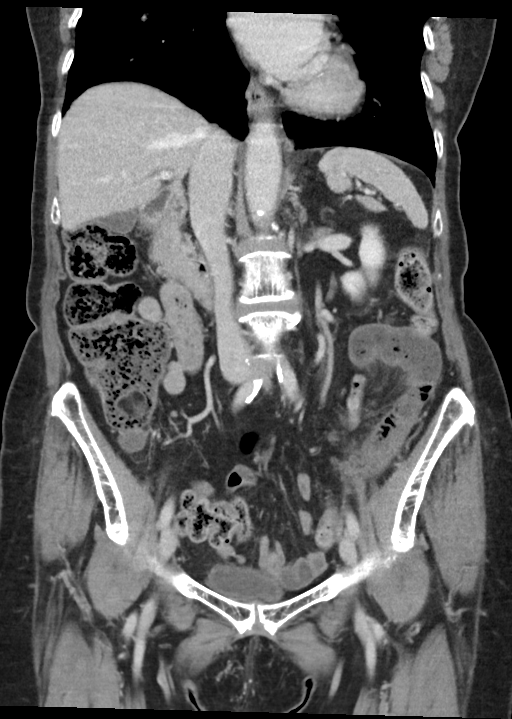
[im 47/85  soft-tissue]
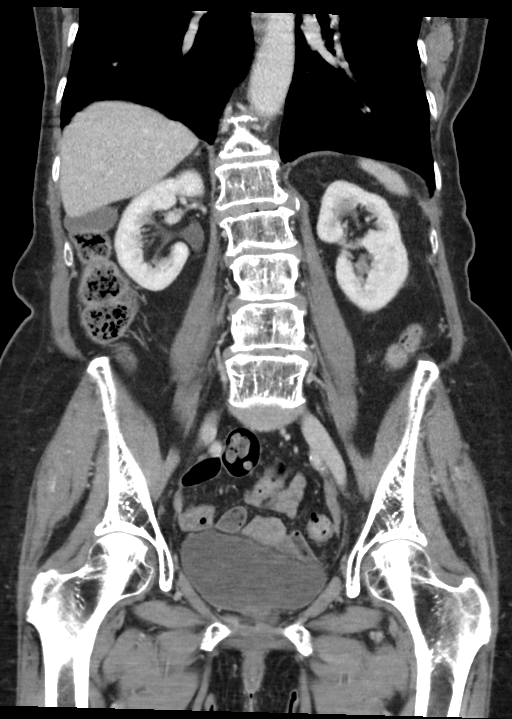

[15 of 46 positions shown; findings below may reference images not displayed]

FINDINGS: Lower chest: Mild bibasilar atelectasis. No consolidation or pleural
effusions.

Hepatobiliary: No focal liver abnormality is seen. No gallstones,
gallbladder wall thickening, or biliary dilatation.

Pancreas: Unremarkable. No pancreatic ductal dilatation or
surrounding inflammatory changes.

Spleen: Normal in size without focal abnormality.

Adrenals/Urinary Tract: Adrenal glands are unremarkable. Kidneys are
normal, without renal calculi, focal lesion, or hydronephrosis.
Bladder is unremarkable.

Stomach/Bowel: The duodenum does not cross midline with jejunum in
the right abdomen, compatible with mild rotation. No evidence of
bowel obstruction. Mild wall thickening and mucosal hyperenhancement
of small bowel loops in the left lower quadrant with a small amount
of surrounding fluid, concerning for enteritis.

Vascular/Lymphatic: No significant vascular findings are present. No
enlarged abdominal or pelvic lymph nodes. Aorta bi-iliac
atherosclerosis.

Reproductive: Status post hysterectomy.

Other: Small volume of free fluid in the anatomic pelvis. No
evidence of free air.

Musculoskeletal: Similar remote T11 compression fracture without
progressive height loss. Osteopenia.
IMPRESSION: 1. Mild inflammatory change of small bowel loops in the left lower
quadrant with a small amount of surrounding fluid, concerning for
enteritis that may be infectious, inflammatory, or ischemic.
2. No evidence of obstruction. There is evidence of small bowel
malrotation, as described above.

## 2022-10-04 ENCOUNTER — Ambulatory Visit: Payer: Self-pay | Admitting: *Deleted

## 2022-10-04 IMAGING — DX DG ABD PORTABLE 1V
1 series · 1 of 1 positions shown · non-contrast
Comparison: None.

CLINICAL DATA: NG tube placement

EXAM:
PORTABLE ABDOMEN - 1 VIEW

[abdomen supine]
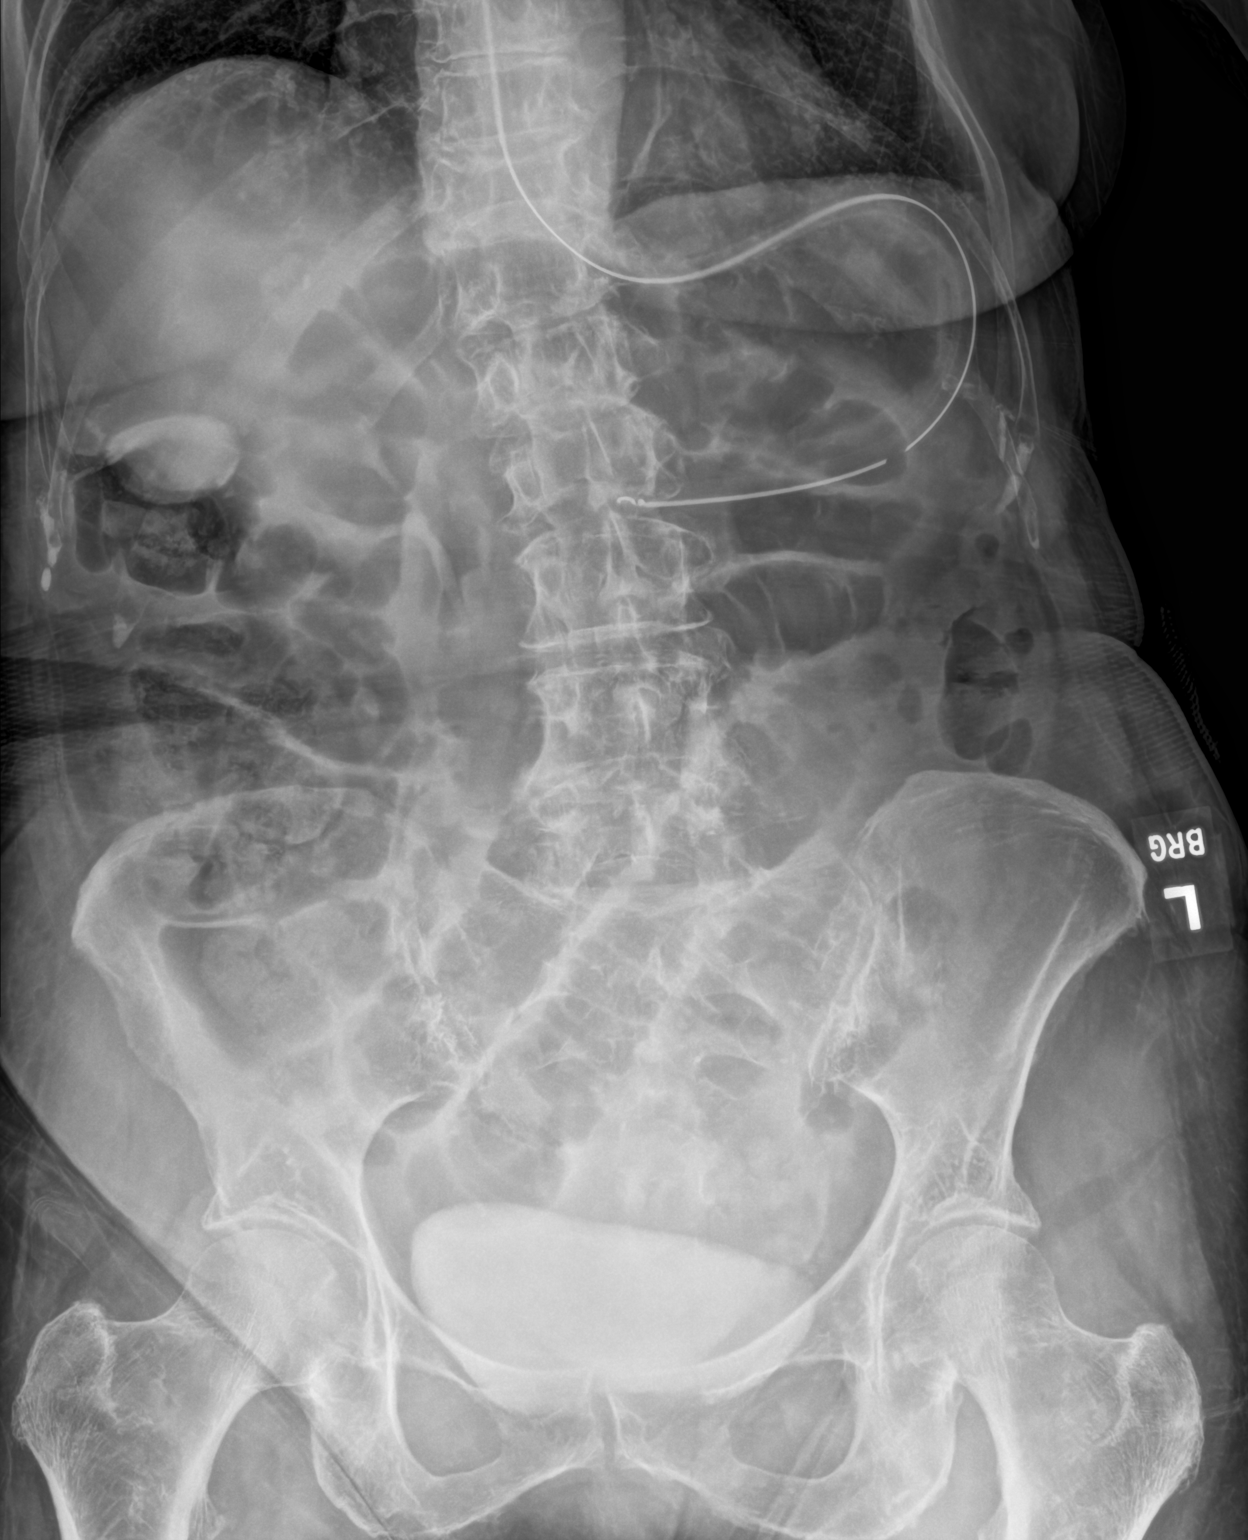

[1 of 1 positions shown; findings below may reference images not displayed]

FINDINGS: NG tube within stomach. Tip is in the gastric antrum. Side port
within the stomach. Mildly dilated loops of small bowel measuring up
to 2.9 cm.
IMPRESSION: NG tube in good position.

## 2022-10-04 IMAGING — CT CT ABD-PELV W/ CM
2 of 5 series · 16 of 46 positions shown, 18 images · IV contrast (Omnipaque or Isovue)
Comparison: August 26, 2020.

CLINICAL DATA: Acute left-sided abdominal pain.

EXAM:
CT ABDOMEN AND PELVIS WITH CONTRAST
TECHNIQUE: Multidetector CT imaging of the abdomen and pelvis was performed
using the standard protocol following bolus administration of
intravenous contrast.
CONTRAST:  75mL OMNIPAQUE IOHEXOL 300 MG/ML  SOLN

[Series 2: axial st · axial · 0.82mm/px · z∈[+818,+1174]mm · 13 of 81 slices shown, 15 images]
[im 5/81  soft-tissue]
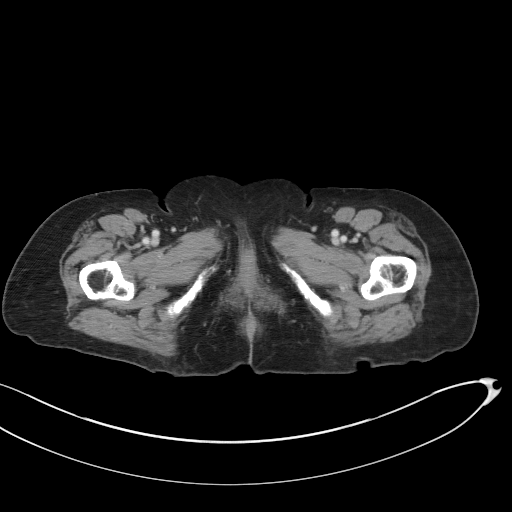
[im 5/81  bone]
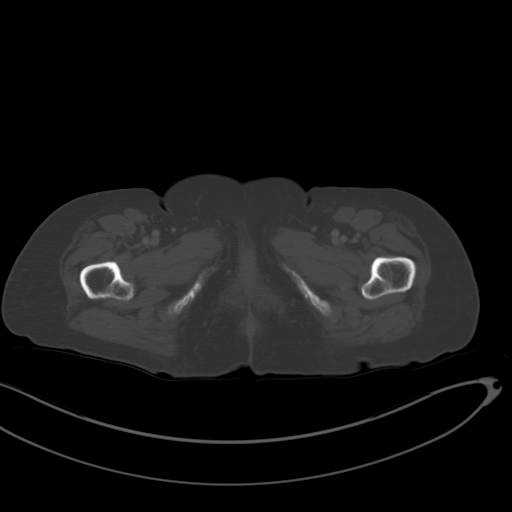
[im 13/81  soft-tissue]
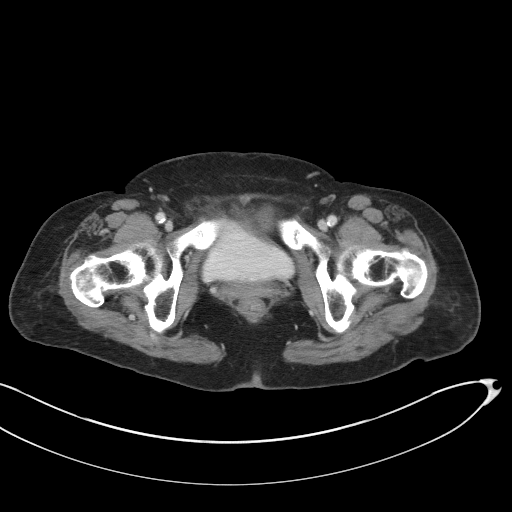
[im 17/81  soft-tissue]
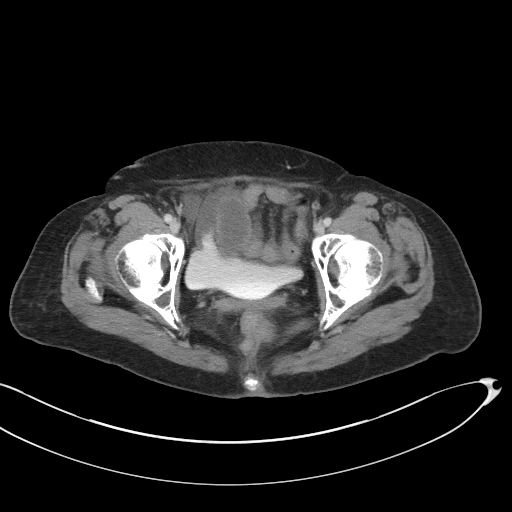
[im 22/81  soft-tissue]
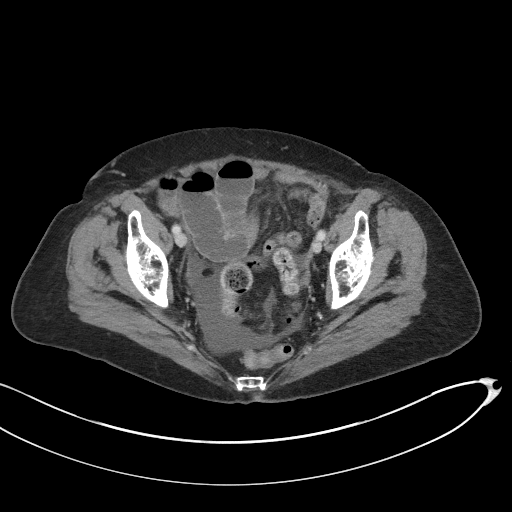
[im 30/81  soft-tissue]
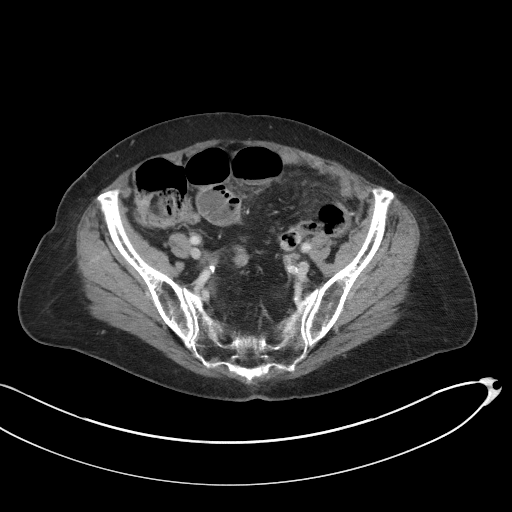
[im 34/81  soft-tissue]
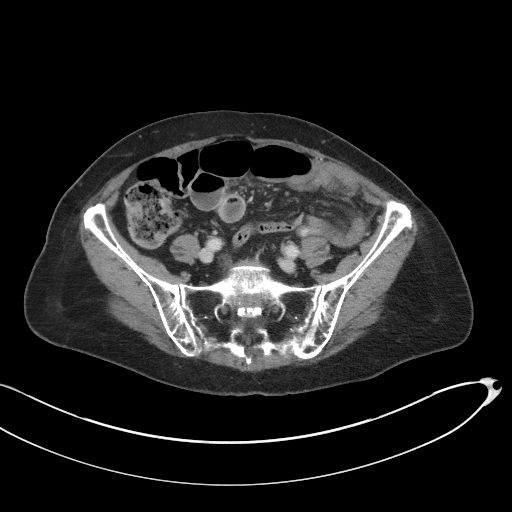
[im 43/81  soft-tissue]
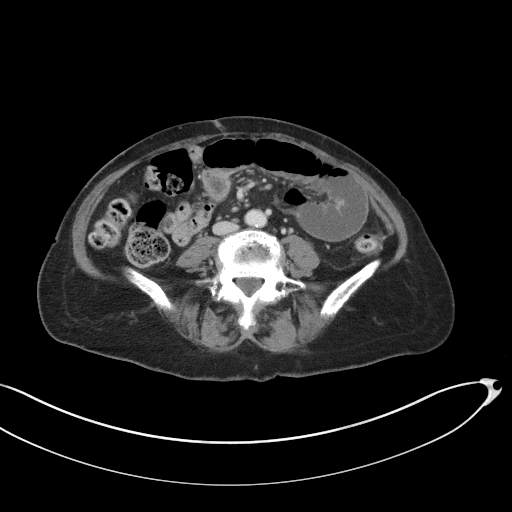
[im 47/81  soft-tissue]
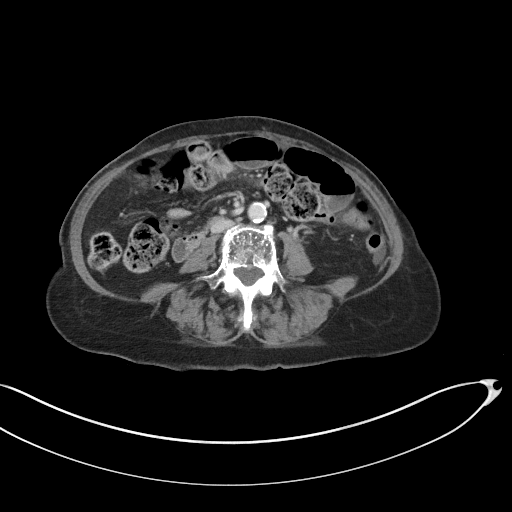
[im 51/81  soft-tissue]
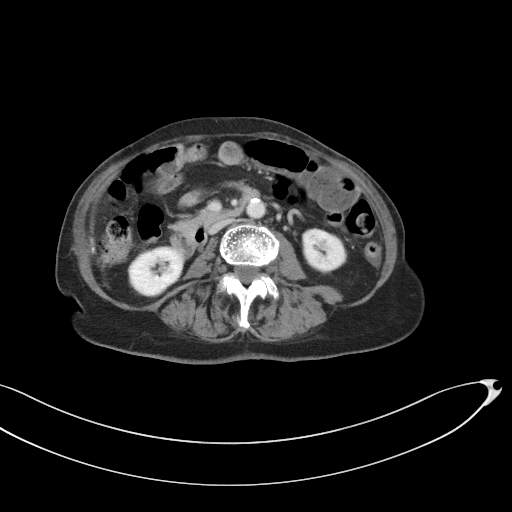
[im 51/81  bone]
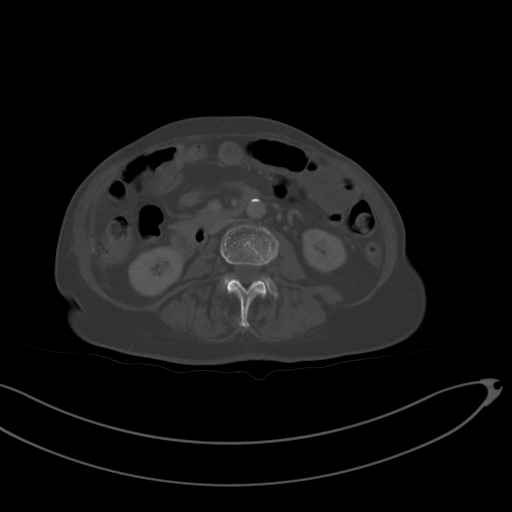
[im 59/81  soft-tissue]
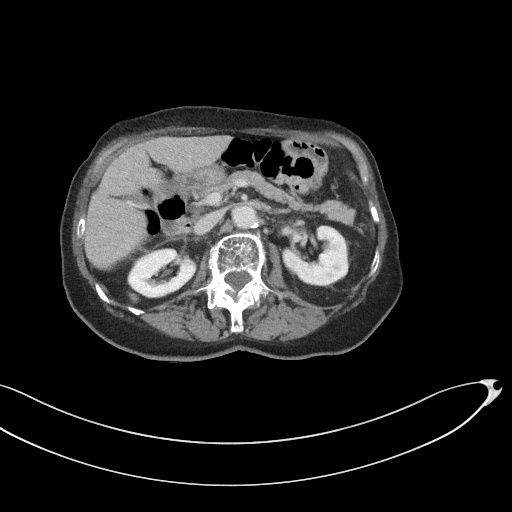
[im 64/81  soft-tissue]
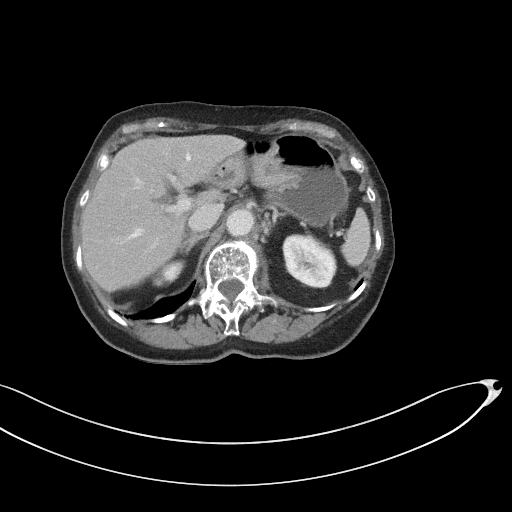
[im 68/81  soft-tissue]
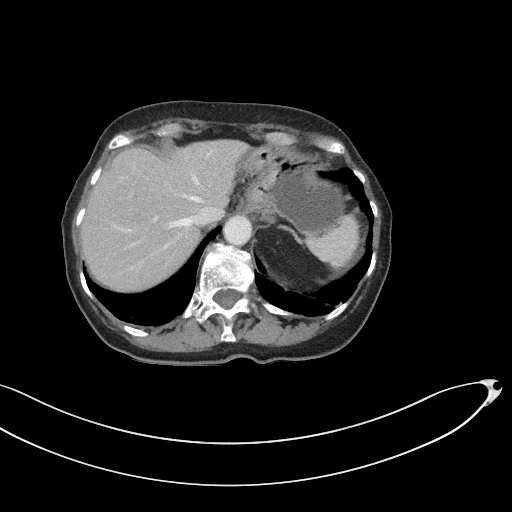
[im 76/81  soft-tissue]
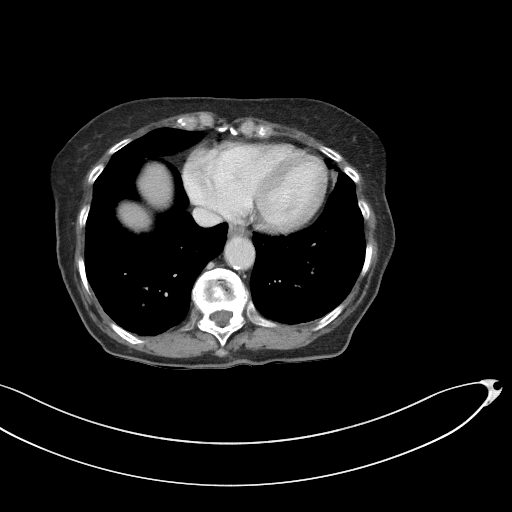

[Series 5: coronal st · coronal · 0.71mm/px · 3 of 87 slices shown]
[im 29/87  soft-tissue]
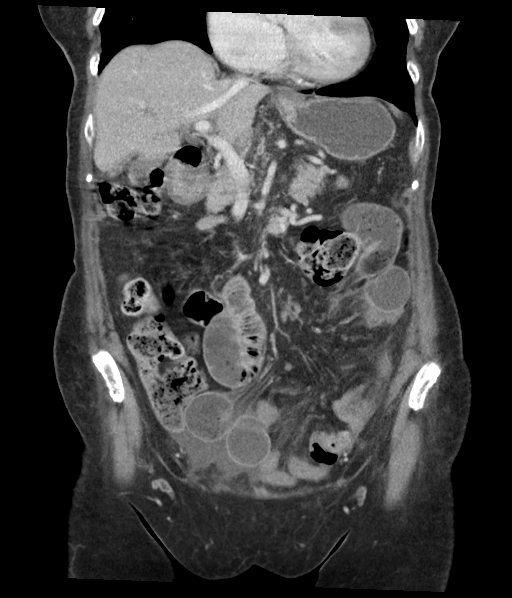
[im 39/87  soft-tissue]
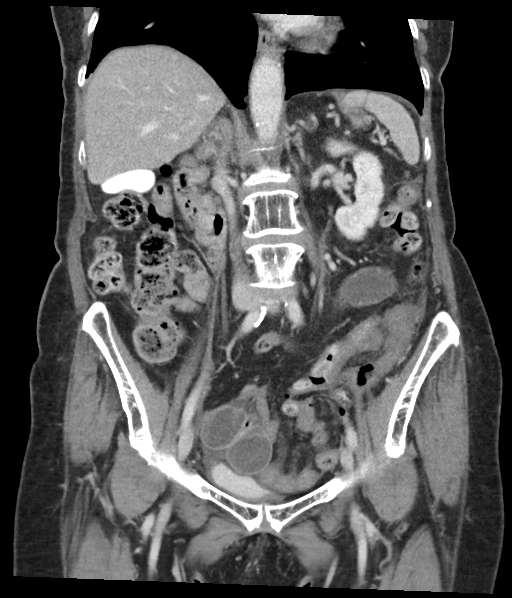
[im 48/87  soft-tissue]
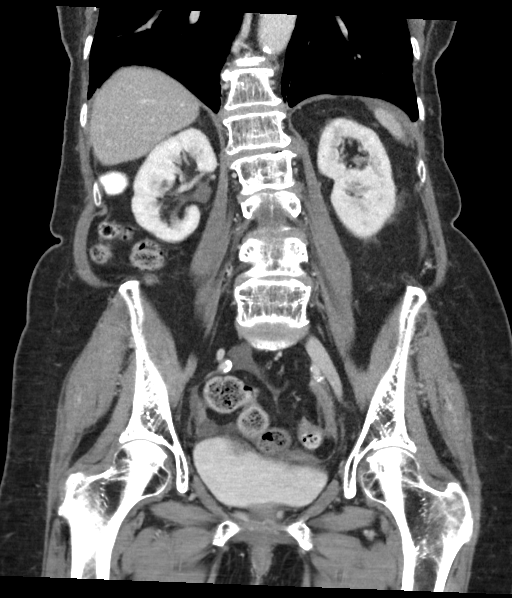

[16 of 46 positions shown; findings below may reference images not displayed]

FINDINGS: Lower chest: No acute abnormality.

Hepatobiliary: No focal liver abnormality is seen. No gallstones,
gallbladder wall thickening, or biliary dilatation.

Pancreas: Unremarkable. No pancreatic ductal dilatation or
surrounding inflammatory changes.

Spleen: Normal in size without focal abnormality.

Adrenals/Urinary Tract: Adrenal glands are unremarkable. Kidneys are
normal, without renal calculi, focal lesion, or hydronephrosis.
Bladder is unremarkable.

Stomach/Bowel: The stomach appears normal. There is significantly
increased small bowel dilatation with transition zone seen in the
left lower quadrant, best noted on image number 36 of series 5. This
is concerning for adhesion. Stool is noted throughout the colon. No
colonic dilatation is noted. The appendix is not visualized.

Vascular/Lymphatic: Aortic atherosclerosis. No enlarged abdominal or
pelvic lymph nodes.

Reproductive: Status post hysterectomy. No adnexal masses.

Other: Mild amount of free fluid is noted in the dependent portion
the pelvis which is increased compared to prior exam. No definite
hernia is noted.

Musculoskeletal: No acute or significant osseous findings.
IMPRESSION: 1. There is significantly increased small bowel dilatation
consistent with obstruction with transition zone seen in the left
lower quadrant, concerning for adhesion. Mild amount of free fluid
is noted in the dependent portion of the pelvis which is increased
compared to prior exam.
2. Aortic atherosclerosis.

Aortic Atherosclerosis ([id number]-ITI.I).

## 2022-10-04 NOTE — Patient Outreach (Signed)
  Care Coordination   10/04/2022 Name: Megan Conley MRN: [id number] DOB: 04-Apr-1936   Care Coordination Outreach Attempts:  An unsuccessful telephone outreach was attempted today to offer the patient information about available care coordination services as a benefit of their health plan.   Follow Up Plan:  Additional outreach attempts will be made to offer the patient care coordination information and services.   Encounter Outcome:  No Answer   Care Coordination Interventions:  No, not indicated    Quyen Cutsforth [name], RN, BSN, [place] Coordinator Office number [phone]

## 2022-10-06 DIAGNOSIS — M5459 Other low back pain: Secondary | ICD-10-CM | POA: Diagnosis not present

## 2022-10-06 DIAGNOSIS — R350 Frequency of micturition: Secondary | ICD-10-CM | POA: Diagnosis not present

## 2022-10-06 DIAGNOSIS — I10 Essential (primary) hypertension: Secondary | ICD-10-CM | POA: Diagnosis not present

## 2022-10-25 ENCOUNTER — Ambulatory Visit: Payer: Self-pay | Admitting: *Deleted

## 2022-10-25 NOTE — Patient Outreach (Signed)
  Care Coordination   10/25/2022 Name: Megan Conley MRN: [id number] DOB: 23-Aug-1936   Care Coordination Outreach Attempts:  {OUTREACH ATTEMPTS:27884}  Follow Up Plan:  {UNSUCCESSFULFU:27885}  Encounter Outcome:  {ENCOUTCOME:27770} {THN Tip this will not be part of the note when signed-REQUIRED REPORT FIELD DO NOT DELETE (Optional):27901}  Care Coordination Interventions:  {INTERVENTIONS:27767}  {THN Tip this will not be part of the note when signed-REQUIRED REPORT FIELD DO NOT DELETE (Optional):27901}  SIG ***

## 2022-10-28 ENCOUNTER — Encounter: Payer: Self-pay | Admitting: *Deleted

## 2022-10-28 ENCOUNTER — Ambulatory Visit: Payer: Self-pay | Admitting: *Deleted

## 2022-10-28 NOTE — Patient Outreach (Addendum)
Care Coordination   Follow Up Visit Note   10/28/2022 Name: Megan Conley MRN: [id number] DOB: Mar 25, 1936  Megan Conley is a 86 y.o. year old female who sees Megan Conley, Megan Areola, MD for primary care. I spoke with her daughter, Megan, Conley by phone today.  What matters to the patients health and wellness today?  Appointment with cardiologist  Dr Megan Conley on 11/02/22 1100 Blood pressure continue to be elevated using an automatic cuff Daughter notes at pcp a pediatric office is used and she has taken the patient's    Back pain on 10/06/22 and was seen by pcp Seen by [facility] PA/NP who was concern about her elevation in blood pressure and made referral  A UTI was ruled out. The back pain was related to increased activity putting up [employer]. The back issue is reported to be resolved  Lost 4 lbs noted in December 2023 recent weight -= 100 lb at 4'10" BMI =20.9 Daughter feels patient likes to be skinny  Safety Daughter confirmed patient is not wearing her medical alert bracelet and has missed placed it . It has not been found  Telephone scamming continues - [place] children went to spectrum website to block scams numbers.created a list incoming people of  and this reduced some of the scam calls Sent an e-mail to the church members about patient's memory concerns and request to limit her incoming calls and reach her daughter if not able to reach her   No term care policy, no veteran benefits  Megan Conley voiced that Megan Conley gets upset with her for providing safety interventions    Goals Addressed               This Visit's Progress     Patient Stated     blood pressure within acceptable limit [facility]) (pt-stated)   Not on track     Care Coordination Interventions:  Assess for blood pressure changes/improvements Discussed blood cuff size and how it effect  Encourage reading of food labels - Women should have 2300 milligrams sodium per day Provide her with  charts Discussed causes of elevated blood pressure Reviewed BMI for patient Confirmed she has an upcoming cardiology visit on 11/02/22 with Dr Megan Conley Sent education via my chart      Home safety (medicines) related to memory [facility]) (pt-stated)   Not on track     Care Coordination Interventions: Assessed for changes/improvement for home safety Confirmed patient is not wearing her medical alert bracelet Encourage use of smart safety devices to assist in allowing patient to continue to live in her home  Discussed patient not qualifying for [place] medicaid services  Inquired if her church has programs to offer home services Active listening Encouraged daughter for all active interventions to keep patient safe Encouraged to set boundaries repetitively  Encourage use of notes in various locations as reminders         SDOH assessments and interventions completed:  No  SDOH Interventions Today    Flowsheet Row Most Recent Value  SDOH Interventions   Food Insecurity Interventions Intervention Not Indicated  Housing Interventions Intervention Not Indicated  Transportation Interventions Intervention Not Indicated  Utilities Interventions Intervention Not Indicated  Financial Strain Interventions Intervention Not Indicated  Stress Interventions Intervention Not Indicated  Social Connections Interventions Intervention Not Indicated        Care Coordination Interventions:  Yes, provided   Follow up plan: Follow up call scheduled for 11/25/22    Encounter Outcome:  Pt. Visit Completed   Megan Conley L. Megan Hamman, RN, BSN, [place] Coordinator Office number [phone]

## 2022-10-28 NOTE — Patient Instructions (Addendum)
Visit Information  Thank you for taking time to visit with me today. Please don't hesitate to contact me if I can be of assistance to you.   Following are the goals we discussed today:   Goals Addressed               This Visit's Progress     Patient Stated     blood pressure within acceptable limit [facility]) (pt-stated)   Not on track     Care Coordination Interventions:  Assess for blood pressure changes/improvements Discussed blood cuff size and how it effect  Encourage reading of food labels - Women should have 2300 milligrams sodium per day Provide her with charts Discussed causes of elevated blood pressure Reviewed BMI for patient Confirmed she has an upcoming cardiology visit on 11/02/22 with Dr Dellia Cloud Sent education via my chart      Home safety (medicines) related to memory [facility]) (pt-stated)   Not on track     Care Coordination Interventions: Assessed for changes/improvement for home safety Confirmed patient is not wearing her medical alert bracelet Encourage use of smart safety devices to assist in allowing patient to continue to live in her home  Discussed patient not qualifying for [place] medicaid services  Inquired if her church has programs to offer home services Active listening Encouraged daughter for all active interventions to keep patient safe Encouraged to set boundaries repetitively  Encourage use of notes in various locations as reminders         Our next appointment is by telephone on 11/25/22 at 1130  Please call the care guide team at [phone] if you need to cancel or reschedule your appointment.   If you are experiencing a Mental Health or [place] or need someone to talk to, please call the Suicide and Crisis Lifeline: 988 call the [place] National Suicide Prevention Lifeline: 6368451133 or TTY: (515)752-7359 TTY (985) 006-0202) to talk to a trained counselor call 1-800-273-TALK (toll free, 24 hour hotline) call the [facility]: 779-703-9779 call 911   Patient verbalizes understanding of instructions and care plan provided today and agrees to view in [place]. Active MyChart status and patient understanding of how to access instructions and care plan via MyChart confirmed with patient.     The patient has been provided with contact information for the care management team and has been advised to call with any health related questions or concerns.    Maylin Freeburg [name], RN, BSN, [place] Coordinator Office number [phone]

## 2022-11-02 ENCOUNTER — Encounter: Payer: Self-pay | Admitting: *Deleted

## 2022-11-02 ENCOUNTER — Encounter: Payer: Self-pay | Admitting: Internal Medicine

## 2022-11-02 ENCOUNTER — Ambulatory Visit: Payer: Medicare Other | Attending: Internal Medicine | Admitting: Internal Medicine

## 2022-11-02 VITALS — BP 106/67 | HR 57 | Ht <= 58 in | Wt 110.4 lb

## 2022-11-02 DIAGNOSIS — R42 Dizziness and giddiness: Secondary | ICD-10-CM | POA: Diagnosis not present

## 2022-11-02 DIAGNOSIS — R5383 Other fatigue: Secondary | ICD-10-CM

## 2022-11-02 DIAGNOSIS — E7849 Other hyperlipidemia: Secondary | ICD-10-CM | POA: Diagnosis not present

## 2022-11-02 DIAGNOSIS — Z136 Encounter for screening for cardiovascular disorders: Secondary | ICD-10-CM | POA: Diagnosis not present

## 2022-11-02 DIAGNOSIS — E785 Hyperlipidemia, unspecified: Secondary | ICD-10-CM | POA: Insufficient documentation

## 2022-11-02 DIAGNOSIS — I10 Essential (primary) hypertension: Secondary | ICD-10-CM | POA: Diagnosis not present

## 2022-11-02 NOTE — Progress Notes (Signed)
Cardiology Office Note  Date: 11/02/2022   ID: Megan Conley, DOB Feb 08, 1936, MRN [id number]  PCP:  Celene Squibb, MD  Cardiologist:  Chalmers Guest, MD Electrophysiologist:  None   Reason for Office Visit: HTN management at the request of Dr. [name].   History of Present Illness: Megan Conley is a 87 y.o. female known to have HTN, hypothyroidism was referred to cardiology clinic for management of HTN.  Patient was diagnosed with HTN 1 year ago, was initially on lisinopril which dropped her blood pressure significantly and hence was switched to losartan thereafter. Since she was diagnosed with HTN, patient was feeling fatigued and dizzy sometimes. Currently she is on losartan 50 mg once daily. She does have blood pressure readings from home from a.m. and afternoon. Blood pressure readings were fluctuant, between 132 195 mmHg SBP between morning and afternoon. I did not see any nighttime blood pressure readings.  She checks her blood pressure while sitting on the kitchen chair and laying her arm on the countertop. She does like to eat potato chips and gets Meals on Wheels every day, 5 days/week. Denied any angina, DOE, palpitations, syncope, LE swelling. Denies smoking cigarettes, alcohol use and illicit drug abuse.  Denied any family history of premature ASCVD.  Past Medical History:  Diagnosis Date   Bowel obstruction ([place])    Glaucoma    Hypothyroidism     Past Surgical History:  Procedure Laterality Date   ABDOMINAL HYSTERECTOMY     cataract extraction both eyes     COLONOSCOPY  06/02/2011   Procedure: COLONOSCOPY;  Surgeon: Rogene Houston, MD;  [facility];  Service: Endoscopy;  Laterality: N/A;   COLONOSCOPY N/A 03/22/2017   Procedure: COLONOSCOPY;  Surgeon: Rogene Houston, MD;  [facility];  Service: Endoscopy;  Laterality: N/A;  830 - MOVED TO 5/23 @ 10:30   LAPAROTOMY N/A 08/28/2020   Procedure: EXPLORATORY LAPAROTOMY;  Surgeon: Virl Cagey, MD;  Location: [facility] OR[facility];  Service: General;  Laterality: N/A;   LYSIS OF ADHESION N/A 08/28/2020   Procedure: LYSIS OF ADHESIONS;  Surgeon: Virl Cagey, MD;  Location: [facility];  Service: General;  Laterality: N/A;   POLYPECTOMY  03/22/2017   Procedure: POLYPECTOMY;  Surgeon: Rogene Houston, MD;  [facility];  Service: Endoscopy;;  colon    Current Outpatient Medications  Medication Sig Dispense Refill   ALPHAGAN P 0.1 % SOLN Apply 1 drop to eye daily.      Calcium-Magnesium-Vitamin D (CALCIUM 1200+D3 PO) Take 1 tablet by mouth daily.     docusate sodium (COLACE) 100 MG capsule Take 1 capsule (100 mg total) by mouth 2 (two) times daily. (Patient taking differently: Take 100 mg by mouth 2 (two) times daily as needed.) 60 capsule 0   latanoprost (XALATAN) 0.005 % ophthalmic solution Place 1 drop into both eyes at bedtime.     levothyroxine (SYNTHROID, LEVOTHROID) 50 MCG tablet Take 50 mcg by mouth daily before breakfast.     losartan (COZAAR) 50 MG tablet Take 50 mg by mouth daily.     Multiple Vitamin (MULTIVITAMIN WITH MINERALS) TABS tablet Take 1 tablet by mouth daily.     polyethylene glycol (MIRALAX) 17 g packet Take 17 g by mouth daily as needed. 14 each 0   pravastatin (PRAVACHOL) 20 MG tablet Take 20 mg by mouth daily.     No current facility-administered medications for this visit.   Allergies:  Patient has no known allergies.   Social History: The patient  reports that she has never smoked. She has never used smokeless tobacco. She reports current alcohol use of about 4.0 standard drinks of alcohol per week. She reports that she does not use drugs.   Family History: The patient's family history includes Heart Problems in her sister; Lung cancer in her father.   ROS:  Please see the history of present illness. Otherwise, complete review of systems is positive for none.  All other systems are reviewed and negative.   Physical Exam: VS:  Ht '4\' 10"'$  (1.473  m)   Wt 110 lb 6.4 oz (50.1 kg)   BMI 23.07 kg/m , BMI Body mass index is 23.07 kg/m.  Wt Readings from Last 3 Encounters:  11/02/22 110 lb 6.4 oz (50.1 kg)  10/28/22 100 lb (45.4 kg)  03/18/22 106 lb 14.8 oz (48.5 kg)    General: Patient appears comfortable at rest. HEENT: Conjunctiva and lids normal, oropharynx clear with moist mucosa. Neck: Supple, no elevated JVP or carotid bruits, no thyromegaly. Lungs: Clear to auscultation, nonlabored breathing at rest. Cardiac: Regular rate and rhythm, no S3 or significant systolic murmur, no pericardial rub. Abdomen: Soft, nontender, no hepatomegaly, bowel sounds present, no guarding or rebound. Extremities: No pitting edema, distal pulses 2+. Skin: Warm and dry. Musculoskeletal: No kyphosis. Neuropsychiatric: Alert and oriented x3, affect grossly appropriate.  ECG: Normal sinus rhythm and no ST-T changes  Recent Labwork: 03/18/2022: ALT 17; AST 22; BUN 16; Creatinine, Ser 0.69; Hemoglobin 14.4; Platelets 301; Potassium 4.1; Sodium 140     Component Value Date/Time   CHOL 237 (H) 10/05/2020 1610   TRIG 122 10/05/2020 1610   HDL 62 10/05/2020 1610   CHOLHDL 3.8 10/05/2020 1610   VLDL 24 10/05/2020 1610   LDLCALC 151 (H) 10/05/2020 1610    Other Studies Reviewed Today:   Assessment and Plan: Patient is a 87 year old F known to have HTN, hypothyroidism was referred to cardiology clinic for management of HTN.  # HTN -Continue losartan 50 mg once daily -Instructed patient to check her blood pressures in a.m. (before taking antihypertensive medication ) and p.m. (at bedtime). Bring the log of BP readings in the next clinic visit.  Educated patient on the accurate blood pressure equipment and measurement, use appropriate cuff size, rest for 5 to 10 minutes before measurement, sit in a quiet place, avoid caffeine for 30 minutes before BP measurement, have an empty bladder, sit with back supported and keep both feet flat on the floor.   -Dietary counseling provided, 2 g sodium diet and try to cut down potato chips, processed food. -she is instructed to call the clinic with the BP readings in the next 2 weeks. If elevated despite dietary measures and accurate blood pressure measurement, will start amlodipine.  # Dizziness -It is unclear if she is feeling dizziness due to low blood pressures intermittently.  Will obtain 2D echocardiogram to rule out structural heart disease and valvular abnormalities.  # HLD -Management of HLD per PCP  I have spent a total of 31 minutes with patient reviewing chart, EKGs, labs and examining patient as well as establishing an assessment and plan that was discussed with the patient.  > 50% of time was spent in direct patient care.      Medication Adjustments/Labs and Tests Ordered: Current medicines are reviewed at length with the patient today.  Concerns regarding medicines are outlined above.   Tests Ordered: Orders Placed This  Encounter  Procedures   EKG 12-Lead    Medication Changes: No orders of the defined types were placed in this encounter.   Disposition:  Follow up  3 months  Signed Shaquasha Gerstel Fidel Levy, MD, 11/02/2022 11:23 AM    [facility] [place], [place], [place] [address]

## 2022-11-02 NOTE — Patient Instructions (Addendum)
Medication Instructions:  Your physician recommends that you continue on your current medications as directed. Please refer to the Current Medication list given to you today.  Labwork: none  Testing/Procedures: Your physician has requested that you have an echocardiogram. Echocardiography is a painless test that uses sound waves to create images of your heart. It provides your doctor with information about the size and shape of your heart and how well your heart's chambers and valves are working. This procedure takes approximately one hour. There are no restrictions for this procedure. Please do NOT wear cologne, perfume, aftershave, or lotions (deodorant is allowed). Please arrive 15 minutes prior to your appointment time.  Follow-Up: Your physician recommends that you schedule a follow-up appointment in: 3 months   Any Other Special Instructions Will Be Listed Below (If Applicable). Your physician has requested that you regularly monitor and record your blood pressure readings at home. Please use the same machine at the same time of day to check your readings and record them to bring to your follow-up visit. You may purchase a pediatric blood pressure cuff online. ([employer])  If you need a refill on your cardiac medications before your next appointment, please call your pharmacy.

## 2022-11-24 ENCOUNTER — Telehealth: Payer: Self-pay

## 2022-11-24 ENCOUNTER — Ambulatory Visit (HOSPITAL_COMMUNITY)
Admission: RE | Admit: 2022-11-24 | Discharge: 2022-11-24 | Disposition: A | Discharge status: Home / Self Care | Payer: Medicare Other | Source: Ambulatory Visit | Attending: Internal Medicine | Admitting: Internal Medicine

## 2022-11-24 DIAGNOSIS — I10 Essential (primary) hypertension: Secondary | ICD-10-CM

## 2022-11-24 DIAGNOSIS — R42 Dizziness and giddiness: Secondary | ICD-10-CM

## 2022-11-24 LAB — ECHOCARDIOGRAM COMPLETE
AR max vel: 2.06 cm2
AV Area VTI: 2.27 cm2
AV Area mean vel: 1.97 cm2
AV Mean grad: 4 mmHg
AV Peak grad: 8.2 mmHg
Ao pk vel: 1.43 m/s
Area-P 1/2: 1.91 cm2
S' Lateral: 1.8 cm

## 2022-11-24 NOTE — Patient Outreach (Signed)
  Care Coordination   Follow Up Visit Note   11/24/2022 Name: Megan Conley MRN: [id number] DOB: [birth year]  Megan Conley is a 87 y.o. year old female who sees Megan Conley, Megan Areola, MD for primary care. I spoke with patient's daughter, Megan Conley) by phone today.  RNCM called to follow up. Per daughter, this is not a good time to talk and request that care guide call at another time to reschedule telephone visit.   SDOH assessments and interventions completed:  No  Care Coordination Interventions:  No, not indicated   Follow up plan: Referral made to care guide to reschedule telephone follow up    Encounter Outcome:  Pt. Request to Call Back   Thea Silversmith, RN, MSN, BSN, [place] Coordinator [phone]

## 2022-11-24 NOTE — Progress Notes (Signed)
*  PRELIMINARY RESULTS* Echocardiogram 2D Echocardiogram has been performed.  Megan Conley 11/24/2022, 10:22 AM

## 2022-12-09 ENCOUNTER — Telehealth: Payer: Self-pay | Admitting: *Deleted

## 2022-12-09 NOTE — Progress Notes (Signed)
  Care Coordination Note  12/09/2022 Name: ADRIANAH PROPHETE MRN: [id number] DOB: 08/10/1936  MIKO MARKWOOD is a 87 y.o. year old female who is a primary care patient of [name], Edwinna Areola, MD and is actively engaged with the care management team. I reached out to [name] by phone today to assist with re-scheduling a follow up visit with the RN Case Manager  Follow up plan: Unsuccessful telephone outreach attempt made. A HIPAA compliant phone message was left for the patient providing contact information and requesting a return call.   [place]  Direct Dial: [phone]

## 2022-12-09 NOTE — Progress Notes (Signed)
  Care Coordination Note  12/09/2022 Name: Megan Conley MRN: [id number] DOB: [birth year]  Megan Conley is a 87 y.o. year old female who is a primary care patient of [name], Edwinna Areola, MD and is actively engaged with the care management team. I reached out to [name] by phone today to assist with re-scheduling a follow up visit with the RN Case Manager  Follow up plan: Telephone appointment with care management team member scheduled for:12/16/22  [place]: [phone]

## 2022-12-16 ENCOUNTER — Ambulatory Visit: Payer: Self-pay | Admitting: *Deleted

## 2022-12-16 DIAGNOSIS — E785 Hyperlipidemia, unspecified: Secondary | ICD-10-CM | POA: Diagnosis not present

## 2022-12-16 DIAGNOSIS — I10 Essential (primary) hypertension: Secondary | ICD-10-CM | POA: Diagnosis not present

## 2022-12-16 DIAGNOSIS — E559 Vitamin D deficiency, unspecified: Secondary | ICD-10-CM | POA: Diagnosis not present

## 2022-12-16 NOTE — Patient Instructions (Addendum)
Visit Information  Thank you for taking time to visit with me today. Please don't hesitate to contact me if I can be of assistance to you.   Following are the goals we discussed today:   Goals Addressed               This Visit's Progress     Patient Stated     blood pressure within acceptable limit ([facility]) (pt-stated)   On track     Care Coordination Interventions:  Assess for blood pressure changes/improvements Discussed blood cuff size and how it effect  Encourage reading of food labels - Women should have 2300 milligrams sodium per day Provide her with charts Discussed causes of elevated blood pressure Reviewed BMI for patient Confirmed she has an upcoming cardiology visit on 11/02/22 with Dr Dellia Cloud Sent education via my chart      Home safety (medicines+) related to memory loss [facility]) (pt-stated)   On track     Care Coordination Interventions: Assessed for changes/improvement for home safety Confirmed patient's medical alert bracelet was returned as she would not wear it  Encourage to continue to use of notes in various locations as reminders Encourage taking medicine all at one time as recommended by her pcp in 2023 Encouraged the daughter to redirect and find creative interventions to communicate and get Mrs Sorbello to understand the concern with the over payments to the female fixing her roof Encouraged her to socialize with possible any other memory loss church members Encourage going to local senior center with her friends Encourage initiating/organize a church family therapy session Encourage possibly use of digital devices to show video about her home memory care needs, family therapy Encourage use of the local senior center dementia staff to assist with dementia patient interactions Encourage her care aide to take patient to various local centers during her 3 hour visits Encourage family therapy with patient Encourage daughter to introduce patient to therapist or  professionals by introducing them as friends Discussed asking the pcp for speech therapy orders for memory loss/cognitive Encourage her to find professionals or others Mrs Braden thinks highly of to discuss the voiced concerns and not [name] (like the staff at [facility] for her license renewal)   Encouraged use of the dementia alliance resource for caregivers +         Our next appointment is by telephone on 12/29/22 at 3:30 pm  Please call the care guide team at [phone] if you need to cancel or reschedule your appointment.   If you are experiencing a Mental Health or [place] or need someone to talk to, please call the Suicide and Crisis Lifeline: 988 call the [place] National Suicide Prevention Lifeline: 438-869-4330 or TTY: 717-270-8892 TTY 571-276-7204) to talk to a trained counselor call 1-800-273-TALK (toll free, 24 hour hotline) call the [facility]: (219)470-7361 call 911   Patient verbalizes understanding of instructions and care plan provided today and agrees to view in [place]. Active MyChart status and patient understanding of how to access instructions and care plan via MyChart confirmed with patient.     The patient has been provided with contact information for the care management team and has been advised to call with any health related questions or concerns.    Garris Melhorn [name], RN, BSN, [place] Coordinator Office number [phone]

## 2022-12-16 NOTE — Patient Outreach (Signed)
Care Coordination   Follow Up Visit Note   12/16/2022 Name: Megan Conley MRN: [id number] DOB: 19-Jul-1936  Megan Conley is a 87 y.o. year old female who sees Megan Conley, Megan Areola, MD for primary care. I spoke with  Megan Conley by phone today.  What matters to the patients health and wellness today?  Completion of cardiac consult (dr Megan Conley) on 11/02/22, 11/24/22 ECHO, no chest pain & BP was fine during this visit Cardiac nurse confirmed the patient's home blood pressure cuff is too large for the patient With a new pediatric BP cuff, her values have been normal except some elevated diastolic values  Next week for 4 month follow up at [place] from cardiology that values of [id number] systolic is normal for patient Continues Losartan  Home management of Memory related to medication administration - Patient continues writing BP values down, reminder notes for the patient are being placed by Daughter, Daughter is noting Megan Conley is taking her medicines at inconsistent times especially her Losartan Example noted to document that she is taking it at 0930 one day then at 3 pm another day  Hx of side effects from [place] and has not been evaluated by a neurologist  Daughter ventilated concerns about the patient getting "rip off by roofing people"   Continues to have home personal care aid visits   Concern with patient's poor right vision related to license renewal pending in 10/2023  Pcp to be seen on Thursday 12/22/22    Goals Addressed               This Visit's Progress     Patient Stated     blood pressure within acceptable limit (THN) (pt-stated)   On track     Care Coordination Interventions:  Assess for blood pressure changes/improvements Discussed blood cuff size and how it effect  Encourage reading of food labels - Women should have 2300 milligrams sodium per day Provide her with charts Discussed causes of elevated blood pressure Reviewed BMI for  patient Confirmed she has an upcoming cardiology visit on 11/02/22 with Dr Megan Conley Sent education via my chart      Home safety (medicines+) related to memory loss [facility]) (pt-stated)   On track     Care Coordination Interventions: Assessed for changes/improvement for home safety Confirmed patient's medical alert bracelet was returned as she would not wear it  Encourage to continue to use of notes in various locations as reminders Encourage taking medicine all at one time as recommended by her pcp in 2023 Encouraged the daughter to redirect and find creative interventions to communicate and get Megan Conley to understand the concern with the over payments to the female fixing her roof Encouraged her to socialize with possible any other memory loss church members Encourage going to local senior center with her friends Encourage initiating/organize a church family therapy session Encourage possibly use of digital devices to show video about her home memory care needs, family therapy Encourage use of the local senior center dementia staff to assist with dementia patient interactions Encourage her care aide to take patient to various local centers during her 3 hour visits Encourage family therapy with patient Encourage daughter to introduce patient to therapist or professionals by introducing them as friends Discussed asking the pcp for speech therapy orders for memory loss/cognitive Encourage her to find professionals or others Megan Conley thinks highly of to discuss the voiced concerns and not Megan Conley (like the staff at [facility] for her  license renewal)   Encouraged use of the dementia alliance resource for caregivers +         SDOH assessments and interventions completed:  Yes  SDOH Interventions Today    Flowsheet Row Most Recent Value  SDOH Interventions   Food Insecurity Interventions Intervention Not Indicated  Financial Strain Interventions Intervention Not Indicated  [paying someone  for roof replacement too many times per daughter, Daughter assists with managing her money]  Social Connections Interventions Intervention Not Indicated       Interventions Today    Flowsheet Row Most Recent Value  Chronic Disease   Chronic disease during today's visit --  [home medicine administration, scammers, redirecting and coping with memory loss patient]  General Interventions   General Interventions Discussed/Reviewed --  [Discssed Dementia alliance Sent Daughter the link to the site, recent cardiology visit, pending pcp visit, Encouragement]  Doctor Visits Discussed/Reviewed PCP, Specialist  Megan Conley Discussed/Reviewed --  [coping with memory loss patient who does not receive redirection well]  Safety Interventions   Safety Discussed/Reviewed Safety Reviewed, Home Safety, Safety Discussed  Home Safety Refer for community resources  [possible speech therapy for cognition/memory loss]        Care Coordination Interventions:  Yes, provided   Follow up plan: Follow up call scheduled for 12/29/22    Encounter Outcome:  Pt. Visit Completed   Megan Conley L. Megan Hamman, RN, BSN, [place] Coordinator Office number [phone]

## 2022-12-22 DIAGNOSIS — E039 Hypothyroidism, unspecified: Secondary | ICD-10-CM | POA: Diagnosis not present

## 2022-12-22 DIAGNOSIS — H409 Unspecified glaucoma: Secondary | ICD-10-CM | POA: Diagnosis not present

## 2022-12-22 DIAGNOSIS — R42 Dizziness and giddiness: Secondary | ICD-10-CM | POA: Diagnosis not present

## 2022-12-22 DIAGNOSIS — R3981 Functional urinary incontinence: Secondary | ICD-10-CM | POA: Diagnosis not present

## 2022-12-22 DIAGNOSIS — M810 Age-related osteoporosis without current pathological fracture: Secondary | ICD-10-CM | POA: Diagnosis not present

## 2022-12-22 DIAGNOSIS — H903 Sensorineural hearing loss, bilateral: Secondary | ICD-10-CM | POA: Diagnosis not present

## 2022-12-22 DIAGNOSIS — R7301 Impaired fasting glucose: Secondary | ICD-10-CM | POA: Diagnosis not present

## 2022-12-22 DIAGNOSIS — R194 Change in bowel habit: Secondary | ICD-10-CM | POA: Diagnosis not present

## 2022-12-22 DIAGNOSIS — G3184 Mild cognitive impairment, so stated: Secondary | ICD-10-CM | POA: Diagnosis not present

## 2022-12-22 DIAGNOSIS — E782 Mixed hyperlipidemia: Secondary | ICD-10-CM | POA: Diagnosis not present

## 2022-12-22 DIAGNOSIS — I10 Essential (primary) hypertension: Secondary | ICD-10-CM | POA: Diagnosis not present

## 2022-12-22 DIAGNOSIS — R7401 Elevation of levels of liver transaminase levels: Secondary | ICD-10-CM | POA: Diagnosis not present

## 2022-12-29 ENCOUNTER — Ambulatory Visit: Payer: Self-pay | Admitting: *Deleted

## 2022-12-29 NOTE — Patient Instructions (Addendum)
Visit Information  Thank you for taking time to visit with me today. Please don't hesitate to contact me if I can be of assistance to you.   Following are the goals we discussed today:   Goals Addressed               This Visit's Progress     Patient Stated     blood pressure within acceptable limit (THN) (pt-stated)   On track     Care Coordination Interventions:  Assess for blood pressure changes/improvements Confirmed improvements       Home safety (medicines+) related to memory loss (THN) (pt-stated)   Not on track     Care Coordination Interventions: Interventions Today    Flowsheet Row Most Recent Value  Chronic Disease   Chronic disease during today's visit Hypertension (HTN), Other  [since last outreach patient has started taking over the counter supplements for memory support without consulting her pcp,  memory concerns]  General Interventions   General Interventions Discussed/Reviewed Communication with  Labs Kidney Function  Doctor Visits Discussed/Reviewed PCP, Specialist  Health Screening --  [Educated on the MMSE -mini mental state examination- answered questions]  PCP/Specialist Visits --  [referred to [facility] NP]  Communication with --  [pcp office manager K Cheek for 12/22/22 office visit information/treatment plan. e-mail forward to daughter]  Education Interventions   Education Provided Provided Education  [sent education on comprehensive metabolic panel, BUN, Creatinine, CBC]  Provided Verbal Education On Nutrition, Labs, Mental Health/Coping with Illness, Medication, Community Resources  [discussed [facility] NP]  Labs Reviewed Kidney Function  [place] Reviewed, Coping Strategies, Other  Nutrition Interventions   Nutrition Discussed/Reviewed Fluid intake, Nutrition Discussed  Pharmacy Interventions   Pharmacy Dicussed/Reviewed Medications and their functions, Pharmacy Topics Discussed, Medication  Adherence  Medication Adherence --  [taking over the counter medicine not prescribed by MD]  Safety Interventions   Safety Discussed/Reviewed Safety Discussed, Home Safety  [taking supplements not recommended by the pcp]  Home Safety Refer for home visit  [facility] NP referral]            Our next appointment is by telephone on 01/25/23 at 3:30 pm  Please call the care guide team at [phone] if you need to cancel or reschedule your appointment.   If you are experiencing a Mental Health or [place] or need someone to talk to, please call the Suicide and Crisis Lifeline: 988 call the [place] National Suicide Prevention Lifeline: (440)066-5759 or TTY: 276-772-3466 TTY (918)425-7351) to talk to a trained counselor call 1-800-273-TALK (toll free, 24 hour hotline) call the [facility]: ([phone] call 911   Patient verbalizes understanding of instructions and care plan provided today and agrees to view in [place]. Active MyChart status and patient understanding of how to access instructions and care plan via MyChart confirmed with patient.     The patient has been provided with contact information for the care management team and has been advised to call with any health related questions or concerns.   Hilmer Aliberti [name], RN, BSN, [place] Coordinator Office number [phone]

## 2022-12-29 NOTE — Patient Outreach (Signed)
  Care Coordination   Follow Up Visit Note   12/29/2022 Name: Megan Conley MRN: [id number] DOB: 01-31-1936  Megan Conley is a 87 y.o. year old female who sees [name], Edwinna Areola, MD for primary care. I spoke with  [name] by phone today.  What matters to the patients health and wellness today?  Concern with Megan Conley possibly taking an unprescribed memory care support medicine/supplement (dated on her documentation book to have started on 12/08/22) Concerns with roof service, hard of hearing, patient and daughter engagement concerns.    Completed visit with pcp on 12/22/22. Lost pounds since last visit. Kidney enzymes were decrease. Her pcp discussed importance of hydration & food intake Pcp discussed not taking over the counter medicines without consulting pcp.  Follow up in 4 months - 04/20/23  Hypertension is now being managed/within normal limits  Neurology- [name] spoke with pcp about neurology. Not preferred The Mini-Mental State Examination (MMSE) was completed per daughter but she does not know what the score was  Daughter agrees to a referral to [facility] NP for memory evaluation   Goals Addressed               This Visit's Progress     Patient Stated     blood pressure within acceptable limit (THN) (pt-stated)   On track     Care Coordination Interventions:  Assess for blood pressure changes/improvements Confirmed improvements       Home safety (medicines+) related to memory loss [facility]) (pt-stated)   Not on track     Care Coordination Interventions: Interventions Today    Flowsheet Row Most Recent Value  Chronic Disease   Chronic disease during today's visit Hypertension (HTN), Other  [since last outreach patient has started taking over the counter supplements for memory support without consulting her pcp,  memory concerns]  General Interventions   General Interventions Discussed/Reviewed Communication with  Labs Kidney Function  Doctor Visits  Discussed/Reviewed PCP, Specialist  Health Screening --  [Educated on the MMSE -mini mental state examination- answered questions]  PCP/Specialist Visits --  [referred to [facility] NP]  Communication with --  [pcp office manager K Cheek for 12/22/22 office visit information/treatment plan. e-mail forward to daughter]  Education Interventions   Education Provided Provided Education  [sent education on comprehensive metabolic panel, BUN, Creatinine, CBC]  Provided Verbal Education On Nutrition, Labs, Mental Health/Coping with Illness, Medication, Community Resources  [discussed [facility] NP]  Labs Reviewed Kidney Function  [place] Reviewed, Coping Strategies, Other  Nutrition Interventions   Nutrition Discussed/Reviewed Fluid intake, Nutrition Discussed  Pharmacy Interventions   Pharmacy Dicussed/Reviewed Medications and their functions, Pharmacy Topics Discussed, Medication Adherence  Medication Adherence --  [taking over the counter medicine not prescribed by MD]  Safety Interventions   Safety Discussed/Reviewed Safety Discussed, Home Safety  [taking supplements not recommended by the pcp]  Home Safety Refer for home visit  [THN NP referral]            SDOH assessments and interventions completed:  No   Care Coordination Interventions:  Yes, provided   Follow up plan: Follow up call scheduled for 01/25/23    Encounter Outcome:  Pt. Visit Completed   Tonesha Tsou [name], RN, BSN, [place] Coordinator Office number [phone]

## 2022-12-30 ENCOUNTER — Telehealth: Payer: Self-pay | Admitting: *Deleted

## 2022-12-30 NOTE — Patient Outreach (Signed)
NP received referral from Dr. [name] and Joellyn Quails, RN to assess pt for in home safety and cognition. Called and left a message. Asked for a return call. Also texted daughter [name] with NP info and request to call back.  Eulah Pont. Myrtie Neither, MSN, [facility] Gerontological Nurse Practitioner [facility] Care Management [phone]

## 2023-01-04 ENCOUNTER — Ambulatory Visit: Payer: Self-pay | Admitting: *Deleted

## 2023-01-05 ENCOUNTER — Encounter: Payer: Self-pay | Admitting: *Deleted

## 2023-01-05 NOTE — Patient Outreach (Signed)
  Care Coordination   Home Visit Note   01/05/2023 Name: Megan Conley MRN: [id number] DOB: 1936/10/16  Megan Conley is a 87 y.o. year old female who sees [name], Edwinna Areola, MD for primary care. I visited  Megan Conley in their home today.  What matters to the patients health and wellness today?  I'd like to improve my memory. What about PREVAGEN?    Goals Addressed               This Visit's Progress     Patient Stated     blood pressure within acceptable limit (THN) (pt-stated)        BP on home visit: 160/80 sitting, 158/80 standing. Probably contributed to anxiety about provider being in her home.       Home safety (medicines+) related to memory loss [facility]) (pt-stated)        Interventions Today    Flowsheet Row Most Recent Value  Chronic Disease   Chronic disease during today's visit Other  [Memory issues]  General Interventions   General Interventions Discussed/Reviewed General Interventions Discussed  [Pt states she does have some memory problems. She laughs and says, "I can't remember, what I can't remember."]  Communication with PCP/Specialists  Education Interventions   Education Provided Provided Education  Provided Verbal Education On Mental Health/Coping with Illness, Medication  Mental Health Interventions   Mental Health Discussed/Reviewed Other  [Cognitive decline reported. MOCA today. Pt scored 22/30. She missed one in the visuospatial/executive out of 5. She missed one on attention out of 2. She was not able to name > 11 words stating with "F" (8). She was not able to recall the 5 words.]  Pharmacy Interventions   Pharmacy Dicussed/Reviewed Medication Adherence  [She has her meds organized. Her daughter puts them in a weekly med box. She reports she is faithful in taking them appropriately. She has her levothyroxine in a convenient location by kitchen sink so she can take that first thing in the am.]  Safety Interventions   Safety Discussed/Reviewed Safety  Discussed, Safety Reviewed, Home Safety, Fall Risk  [Pt lives in a historic home [phone]). There are rugs in every room and there are wooden thresholds going into each room. She reports she has lived there a long time and she is very conscientious about these being fall hazards and exercises caution.]  Advanced Directive Interventions   Advanced Directives Discussed/Reviewed Advanced Directives Discussed  [Pt has a HCPOA and Living Will  She did not remember that she had these.]                SDOH assessments and interventions completed:  Yes   Previously discussed.  Care Coordination Interventions:  Yes, provided     PROVIDING REPORT TO DR. HALL  Follow up plan:  Assigned care manager to foillow.    Encounter Outcome:  Pt. Visit Completed   Kayleen Memos C. Myrtie Neither, MSN, [facility] Gerontological Nurse Practitioner [facility] Care Management [phone]

## 2023-01-18 DIAGNOSIS — H401213 Low-tension glaucoma, right eye, severe stage: Secondary | ICD-10-CM | POA: Diagnosis not present

## 2023-01-25 ENCOUNTER — Ambulatory Visit: Payer: Self-pay | Admitting: *Deleted

## 2023-01-25 ENCOUNTER — Encounter: Payer: Medicare Other | Admitting: *Deleted

## 2023-01-25 NOTE — Patient Outreach (Signed)
  Care Coordination   Follow Up Visit Note   12/13/2023 updated entry for 01/25/23 Name: Megan Conley MRN: [id number] DOB: 05/22/1936  Megan Conley is a 87 y.o. year old female who sees Margo Aye, Kathleene Hazel, MD for primary care. I spoke with  [place] by phone today.  What matters to the patients health and wellness today?  Patient continues to voice concern about her home's roof  The patient continues to want another intervention for her roof vs what an expert roofer/construction church member recommends for her to do to complete the roof repair Patient outreach to daughter today with voiced concerns about the home roof repair, appointments  Daughter expresses that Megan Conley is someone who is unwilling to try new ways of doing things.  Follow up [facility] NP home visit- patient's daughter and her sitter were present during the home visit. Daughter states it went okay  Saw eye Md on last week  Right eye worsening condition (Dr Chiquita Loth or Randon Goldsmith)    Goals Addressed               This Visit's Progress     Patient Stated     Home safety (medicines+) related to memory loss [facility]) (pt-stated)        Interventions Today    Flowsheet Row Most Recent Value  Chronic Disease   Chronic disease during today's visit Other  [follow up [facility] NP home visit, caregiver ventilation/stress, memory issues, home repair, worsening eye changes]  General Interventions   General Interventions Discussed/Reviewed General Interventions Reviewed, Doctor Visits  Doctor Visits Discussed/Reviewed Doctor Visits Discussed, PCP, Specialist  PCP/Specialist Visits Compliance with follow-up visit  Education Interventions   Education Provided Provided Education  [grant for historic homes]              SDOH assessments and interventions completed:  No     Care Coordination Interventions:  Yes, provided   Follow up plan: Follow up call scheduled for pending    Encounter Outcome:  Pt. Visit Completed    Zuleica Seith L. Noelle Penner, RN, BSN, CCM [facility] Care Management [place] Coordinator Office number [phone]

## 2023-02-10 ENCOUNTER — Ambulatory Visit: Payer: Medicare Other | Admitting: Internal Medicine

## 2023-02-28 ENCOUNTER — Ambulatory Visit: Payer: Medicare Other | Attending: Internal Medicine | Admitting: Internal Medicine

## 2023-02-28 ENCOUNTER — Encounter: Payer: Self-pay | Admitting: Internal Medicine

## 2023-02-28 VITALS — BP 146/68 | HR 60 | Ht <= 58 in | Wt 109.8 lb

## 2023-02-28 DIAGNOSIS — R42 Dizziness and giddiness: Secondary | ICD-10-CM

## 2023-02-28 DIAGNOSIS — I10 Essential (primary) hypertension: Secondary | ICD-10-CM | POA: Diagnosis not present

## 2023-02-28 DIAGNOSIS — E7849 Other hyperlipidemia: Secondary | ICD-10-CM

## 2023-02-28 NOTE — Patient Instructions (Signed)
Medication Instructions:  Your physician recommends that you continue on your current medications as directed. Please refer to the Current Medication list given to you today.  *If you need a refill on your cardiac medications before your next appointment, please call your pharmacy*   Lab Work: None If you have labs (blood work) drawn today and your tests are completely normal, you will receive your results only by: MyChart Message (if you have MyChart) OR A paper copy in the mail If you have any lab test that is abnormal or we need to change your treatment, we will call you to review the results.   Testing/Procedures: None   Follow-Up: At [facility], you and your health needs are our priority.  As part of our continuing mission to provide you with exceptional heart care, we have created designated Provider Care Teams.  These Care Teams include your primary Cardiologist (physician) and Advanced Practice Providers (APPs -  Physician Assistants and Nurse Practitioners) who all work together to provide you with the care you need, when you need it.  We recommend signing up for the patient portal called "MyChart".  Sign up information is provided on this After Visit Summary.  MyChart is used to connect with patients for Virtual Visits (Telemedicine).  Patients are able to view lab/test results, encounter notes, upcoming appointments, etc.  Non-urgent messages can be sent to your provider as well.   To learn more about what you can do with MyChart, go to [email or web address].    Your next appointment:   6 month(s)  Provider:   Vishnu Mallipeddi, MD    Other Instructions    

## 2023-02-28 NOTE — Progress Notes (Signed)
Cardiology Office Note  Date: 02/28/2023   ID: Megan, Conley 05-31-1936, MRN [id number]  PCP:  Megan Stabile, MD  Cardiologist:  Megan Bicker, MD Electrophysiologist:  None   Reason for Office Visit: Follow-up of HTN   History of Present Illness: Megan Conley is a 87 y.o. female known to have HTN, hypothyroidism is here for follow-up visit.  Patient was diagnosed with HTN 1 year ago, was initially on lisinopril which dropped her blood pressure significantly and hence was switched to losartan thereafter. Since she was diagnosed with HTN, patient was feeling fatigued and dizzy sometimes. Currently she is on losartan 50 mg once daily. She is here for follow-up visit, accompanied by daughter.  She has intermittent low blood pressures and patient stated that she does not drink adequate water as she is supposed to. Previously, she was on oxybutynin for urge incontinence which was discontinued by PCP due to side effect of HTN.  Daughter thinks her mother has dementia and probably might not be remembering any episodes of dizziness.  Otherwise, denies any episodes of angina, DOE, syncope or leg swelling.   Past Medical History:  Diagnosis Date   Bowel obstruction (HCC)    Glaucoma    Hypothyroidism     Past Surgical History:  Procedure Laterality Date   ABDOMINAL HYSTERECTOMY     cataract extraction both eyes     COLONOSCOPY  06/02/2011   Procedure: COLONOSCOPY;  Surgeon: Malissa Hippo, MD;  [facility];  Service: Endoscopy;  Laterality: N/A;   COLONOSCOPY N/A 03/22/2017   Procedure: COLONOSCOPY;  Surgeon: Malissa Hippo, MD;  [facility];  Service: Endoscopy;  Laterality: N/A;  830 - MOVED TO 5/23 @ 10:30   LAPAROTOMY N/A 08/28/2020   Procedure: EXPLORATORY LAPAROTOMY;  Surgeon: Lucretia Roers, MD;  Location: [facility];  Service: General;  Laterality: N/A;   LYSIS OF ADHESION N/A 08/28/2020   Procedure: LYSIS OF ADHESIONS;  Surgeon:  Lucretia Roers, MD;  Location: [facility];  Service: General;  Laterality: N/A;   POLYPECTOMY  03/22/2017   Procedure: POLYPECTOMY;  Surgeon: Malissa Hippo, MD;  [facility];  Service: Endoscopy;;  colon    Current Outpatient Medications  Medication Sig Dispense Refill   ALPHAGAN P 0.1 % SOLN Apply 1 drop to eye daily.      docusate sodium (COLACE) 100 MG capsule Take 1 capsule (100 mg total) by mouth 2 (two) times daily. (Patient taking differently: Take 100 mg by mouth 2 (two) times daily as needed.) 60 capsule 0   latanoprost (XALATAN) 0.005 % ophthalmic solution Place 1 drop into both eyes at bedtime.     levothyroxine (SYNTHROID, LEVOTHROID) 50 MCG tablet Take 50 mcg by mouth daily before breakfast.     losartan (COZAAR) 50 MG tablet Take 50 mg by mouth daily.     Multiple Vitamin (MULTIVITAMIN WITH MINERALS) TABS tablet Take 1 tablet by mouth daily.     polyethylene glycol (MIRALAX) 17 g packet Take 17 g by mouth daily as needed. 14 each 0   pravastatin (PRAVACHOL) 20 MG tablet Take 20 mg by mouth daily.     timolol (TIMOPTIC) 0.5 % ophthalmic solution 1 drop 2 (two) times daily.     Calcium-Magnesium-Vitamin D (CALCIUM 1200+D3 PO) Take 1 tablet by mouth daily. (Patient not taking: Reported on 02/28/2023)     No current facility-administered medications for this visit.   Allergies:  Patient has  no known allergies.   Social History: The patient  reports that she has never smoked. She has never used smokeless tobacco. She reports current alcohol use of about 4.0 standard drinks of alcohol per week. She reports that she does not use drugs.   Family History: The patient's family history includes Heart Problems in her sister; Lung cancer in her father.   ROS:  Please see the history of present illness. Otherwise, complete review of systems is positive for none.  All other systems are reviewed and negative.   Physical Exam: VS:  BP (!) 146/68   Pulse 60   Ht 4\' 10"  (1.473  m)   Wt 109 lb 12.8 oz (49.8 kg)   SpO2 98%   BMI 22.95 kg/m , BMI Body mass index is 22.95 kg/m.  Wt Readings from Last 3 Encounters:  02/28/23 109 lb 12.8 oz (49.8 kg)  11/02/22 110 lb 6.4 oz (50.1 kg)  10/28/22 100 lb (45.4 kg)    General: Patient appears comfortable at rest. HEENT: Conjunctiva and lids normal, oropharynx clear with moist mucosa. Neck: Supple, no elevated JVP or carotid bruits, no thyromegaly. Lungs: Clear to auscultation, nonlabored breathing at rest. Cardiac: Regular rate and rhythm, no S3 or significant systolic murmur, no pericardial rub. Abdomen: Soft, nontender, no hepatomegaly, bowel sounds present, no guarding or rebound. Extremities: No pitting edema, distal pulses 2+. Skin: Warm and dry. Musculoskeletal: No kyphosis. Neuropsychiatric: Alert and oriented x3, affect grossly appropriate.  ECG: Normal sinus rhythm and no ST-T changes  Recent Labwork: 03/18/2022: ALT 17; AST 22; BUN 16; Creatinine, Ser 0.69; Hemoglobin 14.4; Platelets 301; Potassium 4.1; Sodium 140     Component Value Date/Time   CHOL 237 (H) 10/05/2020 1610   TRIG 122 10/05/2020 1610   HDL 62 10/05/2020 1610   CHOLHDL 3.8 10/05/2020 1610   VLDL 24 10/05/2020 1610   LDLCALC 151 (H) 10/05/2020 1610    Other Studies Reviewed Today:   Assessment and Plan: Patient is a 87 year old F known to have HTN, hypothyroidism was referred to cardiology clinic for management of HTN.  # HTN -Continue losartan 50 mg once daily -Patient forgot to bring blood pressure log to clinic today  # Dizziness likely secondary to dehydration -Encouraged adequate hydration and to frequently change pads due to urge incontinence.  If she continues to have dizziness despite adequate hydration, she will benefit from 2-week event monitor. She is instructed to call the clinic and ask for the same.  # HLD -Management of HLD per PCP  I have spent a total of 31 minutes with patient reviewing chart, EKGs, labs  and examining patient as well as establishing an assessment and plan that was discussed with the patient.  > 50% of time was spent in direct patient care.      Medication Adjustments/Labs and Tests Ordered: Current medicines are reviewed at length with the patient today.  Concerns regarding medicines are outlined above.   Tests Ordered: No orders of the defined types were placed in this encounter.   Medication Changes: No orders of the defined types were placed in this encounter.   Disposition:  Follow up  6 months  Signed Iylah Dworkin Verne Spurr, MD, 02/28/2023 2:13 PM    [facility] at [facility] [address] [place], [place], [place] [address]

## 2023-03-22 DIAGNOSIS — Z6821 Body mass index (BMI) 21.0-21.9, adult: Secondary | ICD-10-CM | POA: Diagnosis not present

## 2023-03-22 DIAGNOSIS — Z713 Dietary counseling and surveillance: Secondary | ICD-10-CM | POA: Diagnosis not present

## 2023-03-22 DIAGNOSIS — I10 Essential (primary) hypertension: Secondary | ICD-10-CM | POA: Diagnosis not present

## 2023-03-22 DIAGNOSIS — R42 Dizziness and giddiness: Secondary | ICD-10-CM | POA: Diagnosis not present

## 2023-03-22 DIAGNOSIS — E039 Hypothyroidism, unspecified: Secondary | ICD-10-CM | POA: Diagnosis not present

## 2023-03-22 DIAGNOSIS — R3981 Functional urinary incontinence: Secondary | ICD-10-CM | POA: Diagnosis not present

## 2023-04-14 DIAGNOSIS — E039 Hypothyroidism, unspecified: Secondary | ICD-10-CM | POA: Diagnosis not present

## 2023-04-14 DIAGNOSIS — E782 Mixed hyperlipidemia: Secondary | ICD-10-CM | POA: Diagnosis not present

## 2023-04-14 DIAGNOSIS — E559 Vitamin D deficiency, unspecified: Secondary | ICD-10-CM | POA: Diagnosis not present

## 2023-04-14 DIAGNOSIS — R7301 Impaired fasting glucose: Secondary | ICD-10-CM | POA: Diagnosis not present

## 2023-04-20 DIAGNOSIS — E782 Mixed hyperlipidemia: Secondary | ICD-10-CM | POA: Diagnosis not present

## 2023-04-20 DIAGNOSIS — R194 Change in bowel habit: Secondary | ICD-10-CM | POA: Diagnosis not present

## 2023-04-20 DIAGNOSIS — H903 Sensorineural hearing loss, bilateral: Secondary | ICD-10-CM | POA: Diagnosis not present

## 2023-04-20 DIAGNOSIS — R3981 Functional urinary incontinence: Secondary | ICD-10-CM | POA: Diagnosis not present

## 2023-04-20 DIAGNOSIS — I10 Essential (primary) hypertension: Secondary | ICD-10-CM | POA: Diagnosis not present

## 2023-04-20 DIAGNOSIS — M810 Age-related osteoporosis without current pathological fracture: Secondary | ICD-10-CM | POA: Diagnosis not present

## 2023-04-20 DIAGNOSIS — E039 Hypothyroidism, unspecified: Secondary | ICD-10-CM | POA: Diagnosis not present

## 2023-04-20 DIAGNOSIS — R413 Other amnesia: Secondary | ICD-10-CM | POA: Diagnosis not present

## 2023-04-20 DIAGNOSIS — R7303 Prediabetes: Secondary | ICD-10-CM | POA: Diagnosis not present

## 2023-04-20 DIAGNOSIS — R7401 Elevation of levels of liver transaminase levels: Secondary | ICD-10-CM | POA: Diagnosis not present

## 2023-04-20 DIAGNOSIS — R42 Dizziness and giddiness: Secondary | ICD-10-CM | POA: Diagnosis not present

## 2023-04-20 DIAGNOSIS — H409 Unspecified glaucoma: Secondary | ICD-10-CM | POA: Diagnosis not present

## 2023-05-18 DIAGNOSIS — H401223 Low-tension glaucoma, left eye, severe stage: Secondary | ICD-10-CM | POA: Diagnosis not present

## 2023-05-18 DIAGNOSIS — Z961 Presence of intraocular lens: Secondary | ICD-10-CM | POA: Diagnosis not present

## 2023-05-18 DIAGNOSIS — H401213 Low-tension glaucoma, right eye, severe stage: Secondary | ICD-10-CM | POA: Diagnosis not present

## 2023-05-18 DIAGNOSIS — H40003 Preglaucoma, unspecified, bilateral: Secondary | ICD-10-CM | POA: Diagnosis not present

## 2023-05-18 DIAGNOSIS — H401233 Low-tension glaucoma, bilateral, severe stage: Secondary | ICD-10-CM | POA: Diagnosis not present

## 2023-06-30 DIAGNOSIS — H401232 Low-tension glaucoma, bilateral, moderate stage: Secondary | ICD-10-CM | POA: Diagnosis not present

## 2023-08-11 DIAGNOSIS — R7303 Prediabetes: Secondary | ICD-10-CM | POA: Diagnosis not present

## 2023-08-11 DIAGNOSIS — E559 Vitamin D deficiency, unspecified: Secondary | ICD-10-CM | POA: Diagnosis not present

## 2023-08-11 DIAGNOSIS — E782 Mixed hyperlipidemia: Secondary | ICD-10-CM | POA: Diagnosis not present

## 2023-08-11 DIAGNOSIS — E039 Hypothyroidism, unspecified: Secondary | ICD-10-CM | POA: Diagnosis not present

## 2023-08-14 DIAGNOSIS — I10 Essential (primary) hypertension: Secondary | ICD-10-CM | POA: Diagnosis not present

## 2023-08-17 DIAGNOSIS — R413 Other amnesia: Secondary | ICD-10-CM | POA: Diagnosis not present

## 2023-08-17 DIAGNOSIS — I10 Essential (primary) hypertension: Secondary | ICD-10-CM | POA: Diagnosis not present

## 2023-08-17 DIAGNOSIS — R3981 Functional urinary incontinence: Secondary | ICD-10-CM | POA: Diagnosis not present

## 2023-08-17 DIAGNOSIS — H409 Unspecified glaucoma: Secondary | ICD-10-CM | POA: Diagnosis not present

## 2023-08-17 DIAGNOSIS — R42 Dizziness and giddiness: Secondary | ICD-10-CM | POA: Diagnosis not present

## 2023-08-17 DIAGNOSIS — E039 Hypothyroidism, unspecified: Secondary | ICD-10-CM | POA: Diagnosis not present

## 2023-08-17 DIAGNOSIS — R194 Change in bowel habit: Secondary | ICD-10-CM | POA: Diagnosis not present

## 2023-08-17 DIAGNOSIS — H903 Sensorineural hearing loss, bilateral: Secondary | ICD-10-CM | POA: Diagnosis not present

## 2023-08-17 DIAGNOSIS — R7303 Prediabetes: Secondary | ICD-10-CM | POA: Diagnosis not present

## 2023-08-17 DIAGNOSIS — R7401 Elevation of levels of liver transaminase levels: Secondary | ICD-10-CM | POA: Diagnosis not present

## 2023-08-17 DIAGNOSIS — E782 Mixed hyperlipidemia: Secondary | ICD-10-CM | POA: Diagnosis not present

## 2023-08-17 DIAGNOSIS — M810 Age-related osteoporosis without current pathological fracture: Secondary | ICD-10-CM | POA: Diagnosis not present

## 2023-08-24 ENCOUNTER — Ambulatory Visit: Payer: Medicare Other | Attending: Internal Medicine | Admitting: Internal Medicine

## 2023-08-24 ENCOUNTER — Encounter: Payer: Self-pay | Admitting: Internal Medicine

## 2023-08-24 VITALS — BP 124/72 | HR 73 | Ht <= 58 in | Wt 109.4 lb

## 2023-08-24 DIAGNOSIS — I10 Essential (primary) hypertension: Secondary | ICD-10-CM | POA: Insufficient documentation

## 2023-08-24 NOTE — Progress Notes (Signed)
Cardiology Office Note  Date: 08/24/2023   ID: Megan Conley, DOB [birth year], MRN [id number]  PCP:  Benita Stabile, MD  Cardiologist:  Marjo Bicker, MD Electrophysiologist:  None    History of Present Illness: Megan Conley is a 87 y.o. female known to have HTN, hypothyroidism is here for follow-up visit.  Patient was diagnosed with HTN 1 year ago, was initially on lisinopril which dropped her blood pressure significantly and hence was switched to losartan thereafter. Since she was diagnosed with HTN, patient was feeling fatigued and dizzy sometimes.Home BP were in the range of 80s millimeter mercury SBP in the last 1 month after which her PCP dropped her losartan dose from 50 mg to 25 mg once daily.  Currently her blood pressures range between 120 and 130 mmHg SBP.  Her ophthalmologist treats her glaucoma and recommended SBP to be more than 120 mmHg for better perfusion of her eyes.  She is here for follow-up visit, accompanied by her daughter.  Denies have any symptoms of dizziness, syncope, presyncope, palpitations, angina, DOE, orthopnea or PND.    Past Medical History:  Diagnosis Date   Bowel obstruction (HCC)    Glaucoma    Hypothyroidism     Past Surgical History:  Procedure Laterality Date   ABDOMINAL HYSTERECTOMY     cataract extraction both eyes     COLONOSCOPY  06/02/2011   Procedure: COLONOSCOPY;  Surgeon: Malissa Hippo, MD;  [facility];  Service: Endoscopy;  Laterality: N/A;   COLONOSCOPY N/A 03/22/2017   Procedure: COLONOSCOPY;  Surgeon: Malissa Hippo, MD;  [facility];  Service: Endoscopy;  Laterality: N/A;  830 - MOVED TO 5/23 @ 10:30   LAPAROTOMY N/A 08/28/2020   Procedure: EXPLORATORY LAPAROTOMY;  Surgeon: Lucretia Roers, MD;  [facility];  Service: General;  Laterality: N/A;   LYSIS OF ADHESION N/A 08/28/2020   Procedure: LYSIS OF ADHESIONS;  Surgeon: Lucretia Roers, MD;  [facility];  Service:  General;  Laterality: N/A;   POLYPECTOMY  03/22/2017   Procedure: POLYPECTOMY;  Surgeon: Malissa Hippo, MD;  [facility];  Service: Endoscopy;;  colon    Current Outpatient Medications  Medication Sig Dispense Refill   ALPHAGAN P 0.1 % SOLN Apply 1 drop to eye daily.      Calcium-Magnesium-Vitamin D (CALCIUM 1200+D3 PO) Take 1 tablet by mouth daily.     docusate sodium (COLACE) 100 MG capsule Take 100 mg by mouth as needed for mild constipation.     dorzolamide (TRUSOPT) 2 % ophthalmic solution Place 1 drop into both eyes daily.     latanoprost (XALATAN) 0.005 % ophthalmic solution Place 1 drop into both eyes at bedtime.     levothyroxine (SYNTHROID, LEVOTHROID) 50 MCG tablet Take 50 mcg by mouth daily before breakfast.     losartan (COZAAR) 25 MG tablet Take 25 mg by mouth daily.     Multiple Vitamin (MULTIVITAMIN WITH MINERALS) TABS tablet Take 1 tablet by mouth daily.     polyethylene glycol (MIRALAX) 17 g packet Take 17 g by mouth daily as needed. 14 each 0   pravastatin (PRAVACHOL) 20 MG tablet Take 20 mg by mouth daily.     No current facility-administered medications for this visit.   Allergies:  Patient has no known allergies.   Social History: The patient  reports that she has never smoked. She has never used smokeless tobacco. She reports current alcohol use  of about 4.0 standard drinks of alcohol per week. She reports that she does not use drugs.   Family History: The patient's family history includes Heart Problems in her sister; Lung cancer in her father.   ROS:  Please see the history of present illness. Otherwise, complete review of systems is positive for none.  All other systems are reviewed and negative.   Physical Exam: VS:  BP 124/72   Pulse 73   Ht 4\' 10"  (1.473 m)   Wt 109 lb 6.4 oz (49.6 kg)   SpO2 96%   BMI 22.86 kg/m , BMI Body mass index is 22.86 kg/m.  Wt Readings from Last 3 Encounters:  08/24/23 109 lb 6.4 oz (49.6 kg)  02/28/23 109  lb 12.8 oz (49.8 kg)  11/02/22 110 lb 6.4 oz (50.1 kg)    General: Patient appears comfortable at rest. HEENT: Conjunctiva and lids normal, oropharynx clear with moist mucosa. Neck: Supple, no elevated JVP or carotid bruits, no thyromegaly. Lungs: Clear to auscultation, nonlabored breathing at rest. Cardiac: Regular rate and rhythm, no S3 or significant systolic murmur, no pericardial rub. Abdomen: Soft, nontender, no hepatomegaly, bowel sounds present, no guarding or rebound. Extremities: No pitting edema, distal pulses 2+. Skin: Warm and dry. Musculoskeletal: No kyphosis. Neuropsychiatric: Alert and oriented x3, affect grossly appropriate.  ECG: Normal sinus rhythm and no ST-T changes  Recent Labwork: No results found for requested labs within last 365 days.     Component Value Date/Time   CHOL 237 (H) 10/05/2020 1610   TRIG 122 10/05/2020 1610   HDL 62 10/05/2020 1610   CHOLHDL 3.8 10/05/2020 1610   VLDL 24 10/05/2020 1610   LDLCALC 151 (H) 10/05/2020 1610    Other Studies Reviewed Today:   Assessment and Plan:   # HTN, controlled -Patient had SBP around 80 mmHg last month and hence the losartan dose had to be decreased from 50 mg to 25 mg once daily.  Currently blood pressure at home ranges between 120 and 130 mmHg SBP.  Her ophthalmologist who treats her glaucoma recommended SBP to be more than 120 mmHg for better perfusion of her eyes.  Continue losartan 25 mg once daily.  Asymptomatic currently.   # HLD -Continue pravastatin 20 mg nightly, follows with PCP.    I have spent a total duration of 20 minutes reviewing her notes, medications, labs, face-to-face discussion/counseling of her medical condition, pathophysiology, evaluation, management and documenting the findings in the note.   Disposition:  Follow up  6 months  Signed Nehemiah Montee Verne Spurr, MD, 08/24/2023 11:38 AM    [facility] at [facility] [address] [place], [place], [place]  [address]

## 2023-08-24 NOTE — Patient Instructions (Signed)

## 2023-09-19 DIAGNOSIS — Z23 Encounter for immunization: Secondary | ICD-10-CM | POA: Diagnosis not present

## 2023-09-20 DIAGNOSIS — Z23 Encounter for immunization: Secondary | ICD-10-CM | POA: Diagnosis not present

## 2023-10-09 DIAGNOSIS — Z961 Presence of intraocular lens: Secondary | ICD-10-CM | POA: Diagnosis not present

## 2023-10-09 DIAGNOSIS — H401213 Low-tension glaucoma, right eye, severe stage: Secondary | ICD-10-CM | POA: Diagnosis not present

## 2023-11-16 DIAGNOSIS — R7303 Prediabetes: Secondary | ICD-10-CM | POA: Diagnosis not present

## 2023-11-16 DIAGNOSIS — Z79890 Hormone replacement therapy (postmenopausal): Secondary | ICD-10-CM | POA: Diagnosis not present

## 2023-11-16 DIAGNOSIS — R3981 Functional urinary incontinence: Secondary | ICD-10-CM | POA: Diagnosis not present

## 2023-11-16 DIAGNOSIS — I10 Essential (primary) hypertension: Secondary | ICD-10-CM | POA: Diagnosis not present

## 2023-11-16 DIAGNOSIS — Z713 Dietary counseling and surveillance: Secondary | ICD-10-CM | POA: Diagnosis not present

## 2023-11-16 DIAGNOSIS — E039 Hypothyroidism, unspecified: Secondary | ICD-10-CM | POA: Diagnosis not present

## 2023-11-16 DIAGNOSIS — H903 Sensorineural hearing loss, bilateral: Secondary | ICD-10-CM | POA: Diagnosis not present

## 2023-11-16 DIAGNOSIS — Z6822 Body mass index (BMI) 22.0-22.9, adult: Secondary | ICD-10-CM | POA: Diagnosis not present

## 2023-12-07 DIAGNOSIS — H401232 Low-tension glaucoma, bilateral, moderate stage: Secondary | ICD-10-CM | POA: Diagnosis not present

## 2023-12-13 ENCOUNTER — Telehealth: Payer: Self-pay | Admitting: *Deleted

## 2023-12-13 NOTE — Progress Notes (Signed)
Complex Care Management Care Guide Note  12/13/2023 Name: XYLIA SCHERGER MRN: [id number] DOB: July 02, 1936  ICESIS RENN is a 88 y.o. year old female who is a primary care patient of Margo Aye, Kathleene Hazel, MD and is actively engaged with the care management team. I reached out to Champ Mungo by phone today to assist with scheduling  with the RN Case Manager.  Follow up plan: Telephone appointment with complex care management team member scheduled for:  2/18  Gwenevere Ghazi  [facility]  [facility], Orthopaedic Ambulatory Surgical Intervention Services Guide  Direct Dial: [phone]  Fax [phone]

## 2023-12-13 NOTE — Progress Notes (Signed)
Complex Care Management Care Guide Note  12/13/2023 Name: MISCHELL BRANFORD MRN: [id number] DOB: 1936/05/30  ANIYHA TATE is a 88 y.o. year old female who is a primary care patient of Margo Aye, Kathleene Hazel, MD and is actively engaged with the care management team. I reached out to Champ Mungo by phone today to assist with scheduling  with the RN Case Manager.  Follow up plan: Unsuccessful telephone outreach attempt made. A HIPAA compliant phone message was left for the patient providing contact information and requesting a return call.  Gwenevere Ghazi  [facility]  [facility], [facility] Guide  Direct Dial: [phone]  Fax [phone]

## 2023-12-13 NOTE — Patient Instructions (Addendum)
Visit Information  Thank you for taking time to visit with me today. Please don't hesitate to contact me if I can be of assistance to you.   Following are the goals we discussed today:   Goals Addressed               This Visit's Progress     Patient Stated     Home safety (medicines+) related to memory loss [facility]) (pt-stated)   On track     Interventions Today    Flowsheet Row Most Recent Value  Chronic Disease   Chronic disease during today's visit Other  [follow up [facility] NP home visit, caregiver ventilation/stress, memory issues, home repair, worsening eye changes]  General Interventions   General Interventions Discussed/Reviewed General Interventions Reviewed, Doctor Visits  Doctor Visits Discussed/Reviewed Doctor Visits Discussed, PCP, Specialist  PCP/Specialist Visits Compliance with follow-up visit  Education Interventions   Education Provided Provided Education  [grant for historic homes]              Our next appointment is by telephone on pending at pending  Please call the care guide team at [phone] if you need to cancel or reschedule your appointment.   If you are experiencing a Mental Health or Behavioral Health Crisis or need someone to talk to, please call the Suicide and Crisis Lifeline: 988 call the [place] National Suicide Prevention Lifeline: 848-556-9973 or TTY: 325-393-9890 TTY 630-884-7920) to talk to a trained counselor call 1-800-273-TALK (toll free, 24 hour hotline) call the [facility]: 253-555-2159 call 911   Patient verbalizes understanding of instructions and care plan provided today and agrees to view in MyChart. Active MyChart status and patient understanding of how to access instructions and care plan via MyChart confirmed with patient.     The patient has been provided with contact information for the care management team and has been advised to call with any health related questions or concerns.    Brittanyann Wittner L.  Noelle Penner, RN, BSN, CCM [facility] RN Care Manager [phone]

## 2023-12-19 ENCOUNTER — Encounter: Payer: Self-pay | Admitting: *Deleted

## 2023-12-20 ENCOUNTER — Ambulatory Visit: Payer: Self-pay | Admitting: *Deleted
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
## 2024-01-17 ENCOUNTER — Ambulatory Visit: Payer: Self-pay | Admitting: *Deleted

## 2024-01-17 NOTE — Patient Outreach (Signed)
 Care Coordination   01/17/2024 Name: Megan Conley MRN: [id number] DOB: Mar 21, 1936   Care Coordination Outreach Attempts:  An unsuccessful outreach was attempted for an appointment today.  Follow Up Plan:  Additional outreach attempts will be made to offer the patient complex care management information and services.   Encounter Outcome:  No Answer   Care Coordination Interventions:  Yes, provided    Chloe Bluett L. Noelle Penner, RN, BSN, CCM [place]  [facility], [facility] Health RN Care Manager Direct Dial: [phone]  Fax: [phone]

## 2024-01-22 ENCOUNTER — Telehealth: Payer: Self-pay | Admitting: *Deleted

## 2024-01-22 NOTE — Progress Notes (Signed)
 Complex Care Management Care Guide Note  01/22/2024 Name: [name] MRN: [id number] DOB: 01-20-1936  Megan Conley is a 88 y.o. year old female who is a primary care patient of Margo Aye, Kathleene Hazel, MD and is actively engaged with the care management team. I reached out to Champ Mungo by phone today to assist with re-scheduling  with the RN Case Manager.  Follow up plan: Unsuccessful telephone outreach attempt made. A HIPAA compliant phone message was left for the patient providing contact information and requesting a return call.  Gwenevere Ghazi  [facility]  [facility], [facility] Guide  Direct Dial: [phone]  Fax [phone]

## 2024-01-30 NOTE — Progress Notes (Signed)
 Complex Care Management Care Guide Note  01/30/2024 Name: Megan Conley MRN: [id number] DOB: 1935/12/13  Megan Conley is a 88 y.o. year old female who is a primary care patient of Margo Aye, Kathleene Hazel, MD and is actively engaged with the care management team. I reached out to Champ Mungo by phone today to assist with re-scheduling  with the RN Case Manager.  Follow up plan: Unsuccessful telephone outreach attempt made. A HIPAA compliant phone message was left for the patient providing contact information and requesting a return call.No further outreach attempts will be made at this time. We have been unable to contact the patient to reschedule for complex care management services.   Gwenevere Ghazi  [facility]  [facility], [facility] Guide  Direct Dial: [phone]  Fax [phone]

## 2024-01-31 ENCOUNTER — Ambulatory Visit: Payer: Self-pay | Admitting: *Deleted

## 2024-01-31 NOTE — Patient Outreach (Signed)
 Care Coordination   Case closure  Visit Note   01/31/2024 Name: Megan Conley MRN: [id number] DOB: September 03, 1936  Megan Conley is a 88 y.o. year old female who sees Margo Aye, Kathleene Hazel, MD for primary care. I  received message from CMA of further outreach attempts made to reschedule patient but unsuccessful x 2. Unable to reach patient- case closure   What matters to the patients health and wellness today?  Case closure    Goals Addressed   None     SDOH assessments and interventions completed:  No     Care Coordination Interventions:  No, not indicated   Follow up plan: No further intervention required.   Encounter Outcome:  Patient Visit Completed   Cala Bradford L. Noelle Penner, RN, BSN, CCM [place]  [facility], [facility] RN Care Manager Direct Dial: [phone]  Fax: [phone]

## 2024-02-01 DIAGNOSIS — H401232 Low-tension glaucoma, bilateral, moderate stage: Secondary | ICD-10-CM | POA: Diagnosis not present

## 2024-02-21 DIAGNOSIS — R7303 Prediabetes: Secondary | ICD-10-CM | POA: Diagnosis not present

## 2024-02-21 DIAGNOSIS — I10 Essential (primary) hypertension: Secondary | ICD-10-CM | POA: Diagnosis not present

## 2024-02-21 DIAGNOSIS — E039 Hypothyroidism, unspecified: Secondary | ICD-10-CM | POA: Diagnosis not present

## 2024-02-27 DIAGNOSIS — R42 Dizziness and giddiness: Secondary | ICD-10-CM | POA: Diagnosis not present

## 2024-02-27 DIAGNOSIS — E782 Mixed hyperlipidemia: Secondary | ICD-10-CM | POA: Diagnosis not present

## 2024-02-27 DIAGNOSIS — R7401 Elevation of levels of liver transaminase levels: Secondary | ICD-10-CM | POA: Diagnosis not present

## 2024-02-27 DIAGNOSIS — R413 Other amnesia: Secondary | ICD-10-CM | POA: Diagnosis not present

## 2024-02-27 DIAGNOSIS — H409 Unspecified glaucoma: Secondary | ICD-10-CM | POA: Diagnosis not present

## 2024-02-27 DIAGNOSIS — M810 Age-related osteoporosis without current pathological fracture: Secondary | ICD-10-CM | POA: Diagnosis not present

## 2024-02-27 DIAGNOSIS — H903 Sensorineural hearing loss, bilateral: Secondary | ICD-10-CM | POA: Diagnosis not present

## 2024-02-27 DIAGNOSIS — R3981 Functional urinary incontinence: Secondary | ICD-10-CM | POA: Diagnosis not present

## 2024-02-27 DIAGNOSIS — R7303 Prediabetes: Secondary | ICD-10-CM | POA: Diagnosis not present

## 2024-02-27 DIAGNOSIS — E039 Hypothyroidism, unspecified: Secondary | ICD-10-CM | POA: Diagnosis not present

## 2024-02-27 DIAGNOSIS — R194 Change in bowel habit: Secondary | ICD-10-CM | POA: Diagnosis not present

## 2024-02-27 DIAGNOSIS — I10 Essential (primary) hypertension: Secondary | ICD-10-CM | POA: Diagnosis not present

## 2024-03-14 ENCOUNTER — Ambulatory Visit: Payer: Self-pay | Admitting: Internal Medicine

## 2024-05-15 ENCOUNTER — Emergency Department (HOSPITAL_COMMUNITY)

## 2024-05-15 ENCOUNTER — Encounter (HOSPITAL_COMMUNITY): Payer: Self-pay

## 2024-05-15 ENCOUNTER — Ambulatory Visit: Admission: EM | Admit: 2024-05-15 | Discharge: 2024-05-15 | Disposition: A | Discharge status: Home / Self Care

## 2024-05-15 ENCOUNTER — Encounter: Payer: Self-pay | Admitting: Emergency Medicine

## 2024-05-15 ENCOUNTER — Emergency Department (HOSPITAL_COMMUNITY)
Admission: EM | Admit: 2024-05-15 | Discharge: 2024-05-15 | Disposition: A | Discharge status: Home / Self Care | Attending: Emergency Medicine | Admitting: Emergency Medicine

## 2024-05-15 ENCOUNTER — Other Ambulatory Visit: Payer: Self-pay

## 2024-05-15 DIAGNOSIS — R918 Other nonspecific abnormal finding of lung field: Secondary | ICD-10-CM | POA: Diagnosis not present

## 2024-05-15 DIAGNOSIS — J929 Pleural plaque without asbestos: Secondary | ICD-10-CM | POA: Diagnosis not present

## 2024-05-15 DIAGNOSIS — R531 Weakness: Secondary | ICD-10-CM

## 2024-05-15 DIAGNOSIS — Z79899 Other long term (current) drug therapy: Secondary | ICD-10-CM | POA: Insufficient documentation

## 2024-05-15 DIAGNOSIS — I959 Hypotension, unspecified: Secondary | ICD-10-CM

## 2024-05-15 DIAGNOSIS — R5383 Other fatigue: Secondary | ICD-10-CM | POA: Diagnosis not present

## 2024-05-15 DIAGNOSIS — I10 Essential (primary) hypertension: Secondary | ICD-10-CM | POA: Insufficient documentation

## 2024-05-15 LAB — URINALYSIS, ROUTINE W REFLEX MICROSCOPIC
Bacteria, UA: NONE SEEN
Bilirubin Urine: NEGATIVE
Glucose, UA: NEGATIVE mg/dL
Hgb urine dipstick: NEGATIVE
Ketones, ur: NEGATIVE mg/dL
Nitrite: NEGATIVE
Protein, ur: NEGATIVE mg/dL
Specific Gravity, Urine: 1.019 (ref 1.005–1.030)
pH: 5 (ref 5.0–8.0)

## 2024-05-15 LAB — COMPREHENSIVE METABOLIC PANEL WITH GFR
ALT: 16 U/L (ref 0–44)
AST: 22 U/L (ref 15–41)
Albumin: 3.3 g/dL — ABNORMAL LOW (ref 3.5–5.0)
Alkaline Phosphatase: 71 U/L (ref 38–126)
Anion gap: 8 (ref 5–15)
BUN: 24 mg/dL — ABNORMAL HIGH (ref 8–23)
CO2: 24 mmol/L (ref 22–32)
Calcium: 9 mg/dL (ref 8.9–10.3)
Chloride: 105 mmol/L (ref 98–111)
Creatinine, Ser: 0.81 mg/dL (ref 0.44–1.00)
GFR, Estimated: >60 mL/min (ref >60)
Glucose, Bld: 111 mg/dL — ABNORMAL HIGH (ref 70–99)
Potassium: 4.3 mmol/L (ref 3.5–5.1)
Sodium: 137 mmol/L (ref 135–145)
Total Bilirubin: 0.6 mg/dL (ref 0.0–1.2)
Total Protein: 6.3 g/dL — ABNORMAL LOW (ref 6.5–8.1)

## 2024-05-15 LAB — CBC WITH DIFFERENTIAL/PLATELET
Abs Immature Granulocytes: 0.02 K/uL (ref 0.00–0.07)
Basophils Absolute: 0 K/uL (ref 0.0–0.1)
Basophils Relative: 1 %
Eosinophils Absolute: 0.1 K/uL (ref 0.0–0.5)
Eosinophils Relative: 2 %
HCT: 41.3 % (ref 36.0–46.0)
Hemoglobin: 12.9 g/dL (ref 12.0–15.0)
Immature Granulocytes: 0 %
Lymphocytes Relative: 23 %
Lymphs Abs: 1.5 K/uL (ref 0.7–4.0)
MCH: 31.8 pg (ref 26.0–34.0)
MCHC: 31.2 g/dL (ref 30.0–36.0)
MCV: 101.7 fL — ABNORMAL HIGH (ref 80.0–100.0)
Monocytes Absolute: 0.6 K/uL (ref 0.1–1.0)
Monocytes Relative: 10 %
Neutro Abs: 4.1 K/uL (ref 1.7–7.7)
Neutrophils Relative %: 64 %
Platelets: 211 K/uL (ref 150–400)
RBC: 4.06 MIL/uL (ref 3.87–5.11)
RDW: 13 % (ref 11.5–15.5)
WBC: 6.4 K/uL (ref 4.0–10.5)
nRBC: 0 % (ref 0.0–0.2)

## 2024-05-15 MED ORDER — LACTATED RINGERS IV BOLUS
500.0000 mL | Freq: Once | INTRAVENOUS | Status: AC
  Administered 2024-05-15: 500 mL via INTRAVENOUS

## 2024-05-15 NOTE — ED Provider Notes (Signed)
 [facility]    CSN: [id number] Arrival date & time: 05/15/24  1218      History   Chief Complaint No chief complaint on file.   HPI Megan Conley is a 88 y.o. female.   Patient presenting today with caregiver and history was provided by both.  States for the past 3 days has been feeling progressively weaker, more fatigued and blood pressure readings have been becoming lower and lower.  Caregiver states her baseline blood pressures are in the 120s to 130s over 70s to 80s range.  She was taken off of her blood pressure medication several weeks ago so they have been monitoring regularly.  States over the last 3 days the numbers have been steadily decreasing and this morning blood pressure was 90/50.  She denies associated chest pain, shortness of breath, palpitations, headache, dizziness, abdominal pain, nausea vomiting diarrhea, urinary symptoms, fevers, chills, upper respiratory symptoms.  So far not trying anything over-the-counter for symptoms.  Past medical history significant for hypertension, hyperlipidemia, hypothyroidism.    Past Medical History:  Diagnosis Date   Bowel obstruction (HCC)    Glaucoma    Hypothyroidism     Patient Active Problem List   Diagnosis Date Noted   Dizziness 11/02/2022   HTN, goal below 150/90 11/02/2022   HLD (hyperlipidemia) 11/02/2022   Elevated blood pressure reading without diagnosis of hypertension 09/17/2016   Hyperglycemia 09/17/2016   Small bowel obstruction (HCC) 09/16/2016   SBO (small bowel obstruction) (HCC) 09/16/2016   Hypothyroidism 09/16/2016   Leukocytosis 09/16/2016   History of colonic polyps 08/25/2016    Past Surgical History:  Procedure Laterality Date   ABDOMINAL HYSTERECTOMY     cataract extraction both eyes     COLONOSCOPY  06/02/2011   Procedure: COLONOSCOPY;  Surgeon: Claudis RAYMOND Rivet, MD;  Location: [facility];  Service: Endoscopy;  Laterality: N/A;   COLONOSCOPY N/A 03/22/2017   Procedure:  COLONOSCOPY;  Surgeon: Rivet Claudis RAYMOND, MD;  Location: [facility];  Service: Endoscopy;  Laterality: N/A;  830 - MOVED TO 5/23 @ 10:30   LAPAROTOMY N/A 08/28/2020   Procedure: EXPLORATORY LAPAROTOMY;  Surgeon: Kallie Manuelita BROCKS, MD;  Location: [facility];  Service: General;  Laterality: N/A;   LYSIS OF ADHESION N/A 08/28/2020   Procedure: LYSIS OF ADHESIONS;  Surgeon: Kallie Manuelita BROCKS, MD;  Location: [facility];  Service: General;  Laterality: N/A;   POLYPECTOMY  03/22/2017   Procedure: POLYPECTOMY;  Surgeon: Rivet Claudis RAYMOND, MD;  Location: [facility];  Service: Endoscopy;;  colon    OB History     Gravida  2   Para  2   Term  2   Preterm      AB      Living  2      SAB      IAB      Ectopic      Multiple      Live Births               Home Medications    Prior to Admission medications   Medication Sig Start Date End Date Taking? Authorizing Provider  ALPHAGAN  P 0.1 % SOLN Apply 1 drop to eye daily.  06/30/20   [provider]  Calcium-Magnesium-Vitamin D (CALCIUM 1200+D3 PO) Take 1 tablet by mouth daily.    [provider]  docusate sodium  (COLACE) 100 MG capsule Take 100 mg by mouth as needed for mild constipation.    [provider]  dorzolamide (TRUSOPT) 2 % ophthalmic solution Place 1 drop into both eyes daily. 05/18/23 05/17/24  [provider]  latanoprost  (XALATAN ) 0.005 % ophthalmic solution Place 1 drop into both eyes at bedtime. 09/06/16   [provider]  levothyroxine  (SYNTHROID , LEVOTHROID) 50 MCG tablet Take 50 mcg by mouth daily before breakfast.    [provider]  losartan (COZAAR) 25 MG tablet Take 25 mg by mouth daily.    [provider]  Multiple Vitamin (MULTIVITAMIN WITH MINERALS) TABS tablet Take 1 tablet by mouth daily.    [provider]  polyethylene glycol (MIRALAX ) 17 g packet Take 17 g by mouth daily as needed. 09/01/20   Antoinette Doe, MD  pravastatin (PRAVACHOL)  20 MG tablet Take 20 mg by mouth daily. 06/10/22   [provider]    Family History Family History  Problem Relation Age of Onset   Lung cancer Father    Heart Problems Sister     Social History Social History   Tobacco Use   Smoking status: Never   Smokeless tobacco: Never  Vaping Use   Vaping status: Never Used  Substance Use Topics   Alcohol use: Yes    Alcohol/week: 4.0 standard drinks of alcohol    Types: 4 Glasses of wine per week    Comment: occasional   Drug use: No     Allergies   Patient has no known allergies.   Review of Systems Review of Systems PER HPI  Physical Exam Triage Vital Signs ED Triage Vitals  Encounter Vitals Group     BP 05/15/24 1224 (!) 92/56     Girls Systolic BP Percentile --      Girls Diastolic BP Percentile --      Boys Systolic BP Percentile --      Boys Diastolic BP Percentile --      Pulse Rate 05/15/24 1224 67     Resp 05/15/24 1224 20     Temp 05/15/24 1224 98.1 F (36.7 C)     Temp Source 05/15/24 1224 Oral     SpO2 05/15/24 1224 96 %     Weight --      Height --      Head Circumference --      Peak Flow --      Pain Score 05/15/24 1226 0     Pain Loc --      Pain Education --      Exclude from Growth Chart --    No data found.  Updated Vital Signs BP (!) 105/59 (BP Location: Right Arm)   Pulse 67   Temp 98.1 F (36.7 C) (Oral)   Resp 20   SpO2 96%   Visual Acuity Right Eye Distance:   Left Eye Distance:   Bilateral Distance:    Right Eye Near:   Left Eye Near:    Bilateral Near:      Exam significantly abbreviated today as decision was made quickly to go to the ER for further workup and evaluation Physical Exam Vitals and nursing note reviewed.  Constitutional:      Appearance: Normal appearance.     Comments: Appears weak, fatigued  HENT:     Head: Atraumatic.     Mouth/Throat:     Mouth: Mucous membranes are moist.  Eyes:     Extraocular Movements: Extraocular movements intact.      Conjunctiva/sclera: Conjunctivae normal.  Cardiovascular:     Rate and Rhythm: Normal rate.  Pulmonary:     Effort: Pulmonary effort is normal.  Musculoskeletal:     Cervical back: Normal range of motion and neck supple.     Comments: ROM at baseline  Skin:    General: Skin is warm and dry.  Neurological:     Mental Status: She is alert and oriented to person, place, and time.  Psychiatric:        Mood and Affect: Mood normal.        Thought Content: Thought content normal.        Judgment: Judgment normal.      UC Treatments / Results  Labs (all labs ordered are listed, but only abnormal results are displayed) Labs Reviewed - No data to display  EKG   Radiology DG Chest Portable 1 View Result Date: 05/15/2024 CLINICAL DATA:  Worsening fatigue and weakness x3 days. EXAM: PORTABLE CHEST 1 VIEW COMPARISON:  October 15, 2017 FINDINGS: The heart size and mediastinal contours are within normal limits. There is mild biapical pleural thickening. Diffuse, chronic appearing increased interstitial lung markings are seen. No acute infiltrate, pleural effusion or pneumothorax is identified. Multilevel degenerative changes are present throughout the thoracic. IMPRESSION: Chronic appearing increased interstitial lung markings without evidence of acute or active cardiopulmonary disease. Electronically Signed   By: Suzen Dials M.D.   On: 05/15/2024 15:56    Procedures Procedures (including critical care time)  Medications Ordered in UC Medications - No data to display  Initial Impression / Assessment and Plan / UC Course  I have reviewed the triage vital signs and the nursing notes.  Pertinent labs & imaging results that were available during my care of the patient were reviewed by me and considered in my medical decision making (see chart for details).     Given her vague generalized symptoms of progressively worsening weakness, fatigue and hypotension discussed with  patient and caregiver my recommendation to go to the emergency department for an immediate and further workup.  Given lack of resources for immediate workup in this setting do feel this is the safest option.  Patient, caregiver and daughter on the phone all agreeable and wished to go via private vehicle.  Declined EMS transport which was offered.  She was initially hypotensive upon arrival, recheck improved to 105/59 which is still well below her baseline blood pressure.  Patient transferred via private vehicle to the emergency department for further workup.  Final Clinical Impressions(s) / UC Diagnoses   Final diagnoses:  Generalized weakness  Other fatigue  Hypotension, unspecified hypotension type   Discharge Instructions   None    ED Prescriptions   None    PDMP not reviewed this encounter.   Stuart Vernell Norris, [place] 05/15/24 1943

## 2024-05-15 NOTE — Discharge Instructions (Addendum)
 You were evaluated today for generalized weakness.  Fortunately your workup was reassuring.  We have sent your urine for culture as it did have some white blood cells, but there were no bacteria and you are not having symptoms so at this time we are not going to give you any antibiotics.  Keep your appoint with a primary care doctor tomorrow, we gave you IV fluids and since you are able to walk and are feeling somewhat better we will discharge you home.  Please come to the ER if you have new or worsening symptoms.

## 2024-05-15 NOTE — ED Triage Notes (Signed)
 Pt arrived via POV from Urgent Care today for evaluation of worsening fatigue and weakness X  3 days. Pt denies N/V/D, denies any recent falls, and Pt reports she is unsure if she is staying hydrated or not.

## 2024-05-15 NOTE — ED Triage Notes (Signed)
 States feels weak x 3 days.  Care giver states BP has been lower today than normal

## 2024-05-15 NOTE — ED Provider Notes (Signed)
 [place] [facility]ENC[facility]PARTMENT AT [facility] Provider Note   CSN: [id number] Arrival date & time: 05/15/24  1244     Patient presents with: Weakness   Megan Conley is a 88 y.o. female. presents to the ER for evaluation from urgent care for progressively worsening generalized weakness and low blood pressure.  When she checked into urgent care today the pressure was 92/56, on repeat it was 105/59 though family reports this is low for her.  She is here with caregiver who helps provide history.  They state that PCP had taken her off her blood pressure medication several weeks ago so she is no longer taking blood pressure medication, she has just had a gradual decline over the past several days where she has no energy, no focal weakness, no falls.  No headache, no fever or chills, no chest pain or shortness of breath, no nausea vomiting or diarrhea.  Sent by urgent care for further evaluation due to low blood pressure and needing more broad workup.    Weakness      Prior to Admission medications   Medication Sig Start Date End Date Taking? Authorizing Provider  ALPHAGAN  P 0.1 % SOLN Apply 1 drop to eye daily.  06/30/20   [provider]  Calcium-Magnesium-Vitamin D (CALCIUM 1200+D3 PO) Take 1 tablet by mouth daily.    [provider]  docusate sodium  (COLACE) 100 MG capsule Take 100 mg by mouth as needed for mild constipation.    [provider]  dorzolamide (TRUSOPT) 2 % ophthalmic solution Place 1 drop into both eyes daily. 05/18/23 05/17/24  [provider]  latanoprost  (XALATAN ) 0.005 % ophthalmic solution Place 1 drop into both eyes at bedtime. 09/06/16   [provider]  levothyroxine  (SYNTHROID , LEVOTHROID) 50 MCG tablet Take 50 mcg by mouth daily before breakfast.    [provider]  losartan (COZAAR) 25 MG tablet Take 25 mg by mouth daily.    [provider]  Multiple Vitamin (MULTIVITAMIN WITH MINERALS)  TABS tablet Take 1 tablet by mouth daily.    [provider]  polyethylene glycol (MIRALAX ) 17 g packet Take 17 g by mouth daily as needed. 09/01/20   Antoinette Doe, MD  pravastatin (PRAVACHOL) 20 MG tablet Take 20 mg by mouth daily. 06/10/22   [provider]    Allergies: Patient has no known allergies.    Review of Systems  Neurological:  Positive for weakness.    Updated Vital Signs BP (!) 167/76   Pulse 66   Temp 97.7 F (36.5 C) (Oral)   Resp 13   Ht 4' 10 (1.473 m)   Wt 49.6 kg   SpO2 98%   BMI 22.85 kg/m   Physical Exam Vitals and nursing note reviewed.  Constitutional:      General: She is not in acute distress.    Appearance: She is well-developed.  HENT:     Head: Normocephalic and atraumatic.     Mouth/Throat:     Mouth: Mucous membranes are moist.  Eyes:     Conjunctiva/sclera: Conjunctivae normal.  Cardiovascular:     Rate and Rhythm: Normal rate and regular rhythm.     Heart sounds: No murmur heard. Pulmonary:     Effort: Pulmonary effort is normal. No respiratory distress.     Breath sounds: Normal breath sounds.  Abdominal:     Palpations: Abdomen is soft.     Tenderness: There is no abdominal tenderness.  Musculoskeletal:  General: No swelling.     Cervical back: Neck supple.  Skin:    General: Skin is warm and dry.     Capillary Refill: Capillary refill takes less than 2 seconds.  Neurological:     General: No focal deficit present.     Mental Status: She is alert and oriented to person, place, and time.  Psychiatric:        Mood and Affect: Mood normal.     (all labs ordered are listed, but only abnormal results are displayed) Labs Reviewed  CBC WITH DIFFERENTIAL/PLATELET - Abnormal; Notable for the following components:      Result Value   MCV 101.7 (*)    All other components within normal limits  COMPREHENSIVE METABOLIC PANEL WITH GFR - Abnormal; Notable for the following components:   Glucose, Bld 111 (*)     BUN 24 (*)    Total Protein 6.3 (*)    Albumin 3.3 (*)    All other components within normal limits  URINALYSIS, ROUTINE W REFLEX MICROSCOPIC - Abnormal; Notable for the following components:   Leukocytes,Ua SMALL (*)    All other components within normal limits  URINE CULTURE    EKG: None  Radiology: DG Chest Portable 1 View Result Date: 05/15/2024 CLINICAL DATA:  Worsening fatigue and weakness x3 days. EXAM: PORTABLE CHEST 1 VIEW COMPARISON:  October 15, 2017 FINDINGS: The heart size and mediastinal contours are within normal limits. There is mild biapical pleural thickening. Diffuse, chronic appearing increased interstitial lung markings are seen. No acute infiltrate, pleural effusion or pneumothorax is identified. Multilevel degenerative changes are present throughout the thoracic. IMPRESSION: Chronic appearing increased interstitial lung markings without evidence of acute or active cardiopulmonary disease. Electronically Signed   By: Suzen Dials M.D.   On: 05/15/2024 15:56     Procedures   Medications Ordered in the ED  lactated ringers  bolus 500 mL (0 mLs Intravenous Stopped 05/15/24 1713)                                    Medical Decision Making Differential diagnosis includes but not limited to dehydration, UTI, anemia, arrhythmia, electrolyte disturbance, other  ED course: Patient was brought in by family for generalized weakness, initially went to urgent care and had slightly low blood pressure readings but here has had normal to slightly hypertensive blood pressure readings, no focal weakness, she able to ambulate to the bathroom without assistance.  BUN was slightly elevated at 24, given IV fluids and she is feeling better.  She has a PCP tomorrow with Dr. Shona and is going to follow-up there.  UA was reviewed and shows small leuks and 11-20 white blood cells with no bacteria and she is not having any urinary symptoms so sent for culture we will have her  follow-up with her PCP for this.  No indication for antibiotics at this time.  EKG was seen, shows sinus rhythm with multiple PVCs but no ischemic changes and patient's not having any chest pain or palpitations.  Orthostatics were normal.    Amount and/or Complexity of Data Reviewed Independent Historian: caregiver External Data Reviewed: labs and notes. Labs: ordered. Radiology: ordered and independent interpretation performed.    Details: Sinus rhythm with frequent PVCs rate of 69 no ST or T wave changes        Final diagnoses:  Weakness    ED Discharge Orders  None          Suellen Sherran DELENA DEVONNA 05/15/24 1844    Francesca Elsie CROME, MD 05/15/24 2101

## 2024-05-15 NOTE — ED Notes (Signed)
 Patient is being discharged from the Urgent Care and sent to the Emergency Department via private vehicle . Per PA, patient is in need of higher level of care due to weakness and low BP. Patient is aware and verbalizes understanding of plan of care.  Vitals:   05/15/24 1224 05/15/24 1228  BP: (!) 92/56 (!) 105/59  Pulse: 67   Resp: 20   Temp: 98.1 F (36.7 C)   SpO2: 96%

## 2024-05-16 LAB — URINE CULTURE: Culture: NO GROWTH

## 2024-05-17 DIAGNOSIS — H401233 Low-tension glaucoma, bilateral, severe stage: Secondary | ICD-10-CM | POA: Diagnosis not present

## 2024-08-22 DIAGNOSIS — I10 Essential (primary) hypertension: Secondary | ICD-10-CM | POA: Diagnosis not present

## 2024-08-22 DIAGNOSIS — R7303 Prediabetes: Secondary | ICD-10-CM | POA: Diagnosis not present

## 2024-08-22 DIAGNOSIS — E559 Vitamin D deficiency, unspecified: Secondary | ICD-10-CM | POA: Diagnosis not present

## 2024-08-22 DIAGNOSIS — E039 Hypothyroidism, unspecified: Secondary | ICD-10-CM | POA: Diagnosis not present

## 2024-08-29 ENCOUNTER — Encounter: Payer: Self-pay | Admitting: Internal Medicine

## 2024-08-29 DIAGNOSIS — R001 Bradycardia, unspecified: Secondary | ICD-10-CM | POA: Diagnosis not present

## 2024-08-29 DIAGNOSIS — R7401 Elevation of levels of liver transaminase levels: Secondary | ICD-10-CM | POA: Diagnosis not present

## 2024-08-29 DIAGNOSIS — Z23 Encounter for immunization: Secondary | ICD-10-CM | POA: Diagnosis not present

## 2024-08-29 DIAGNOSIS — R413 Other amnesia: Secondary | ICD-10-CM | POA: Diagnosis not present

## 2024-08-29 DIAGNOSIS — Z0000 Encounter for general adult medical examination without abnormal findings: Secondary | ICD-10-CM | POA: Diagnosis not present

## 2024-08-29 DIAGNOSIS — Z0001 Encounter for general adult medical examination with abnormal findings: Secondary | ICD-10-CM | POA: Diagnosis not present

## 2024-08-29 DIAGNOSIS — H409 Unspecified glaucoma: Secondary | ICD-10-CM | POA: Diagnosis not present

## 2024-08-29 DIAGNOSIS — R3981 Functional urinary incontinence: Secondary | ICD-10-CM | POA: Diagnosis not present

## 2024-08-29 DIAGNOSIS — E039 Hypothyroidism, unspecified: Secondary | ICD-10-CM | POA: Diagnosis not present

## 2024-08-29 DIAGNOSIS — I10 Essential (primary) hypertension: Secondary | ICD-10-CM | POA: Diagnosis not present

## 2024-08-29 DIAGNOSIS — R194 Change in bowel habit: Secondary | ICD-10-CM | POA: Diagnosis not present

## 2024-08-29 DIAGNOSIS — R7303 Prediabetes: Secondary | ICD-10-CM | POA: Diagnosis not present

## 2024-10-06 NOTE — Progress Notes (Unsigned)
 Cardiology Office Note:  .   Date:  10/06/2024  ID:  Megan Conley, DOB 06-14-1936, MRN [id number] PCP: Shona Norleen PEDLAR, MD  [facility] Providers Cardiologist:  Diannah SHAUNNA Maywood, MD {  History of Present Illness: .   Megan Conley is a 88 y.o. female  with PMHx of HTN, HLD, hypothyroidism, glaucoma who reports to [facility] office for follow up.   Relevant cardiac studies ECHO 10/2022: EF 60 to 65%, G1 DD, mild to moderately dilated RA, trivial MV regurgitation, AV sclerosis/calcification without AS  Last seen in heartcare 08/2023 by Dr. Mallipeddi for follow up visit.  Noted SBP around 80 mmHg last month, therefore her PCP reduced losartan from 50 mg to 25 mg once daily.  Afterwards, home BPs 120 to 130 mmHg SBP.  Continued on losartan 25 mg daily.   Hospitalization 05/15/2024 for progressively worsening generalized weakness and low BP.  EKG showed NSR with multiple PVCs without any ischemic changes.  Orthostatics were normal.  Noted that PCP had taken her off her BP medication several weeks ago and is no longer taking any BP meds.  Noted a gradual decline over the past several days with no energy, no focal weakness, no falls, no chest pain. Treated with IV fluids and noted feeling better.  No med changes.  Today, reports ### and denies ###.  Denies chest pain, shortness of breath, palpitations, syncope, presyncope, dizziness, orthopnea, PND, swelling or significant weight changes, acute bleeding, or claudication.   Reports compliance with medications.  Dietary habitats:  Activity level:  Social: Denies tobacco use/alcohol/drug use  Denies any recent hospitalizations or visits to the emergency department.   Hypertension, unspecified type Reports well controlled Home BP:  BP this OV well controlled today:  Continue on losartan 25 mg daily Her ophthalmologist who treats her glaucoma recommended SBP to be more than 120 mmHg for better perfusion of breath.  Encourage physical  activity for 150 minutes per week and heart healthy low sodium diet. Discussed limiting sodium intake to < 2 grams daily.    Hyperlipidemia LDL goal <100 LDL 93 in 07/2024 Continue pravastatin 20 mg daily Follows with PCP  ROS: 10 point review of system has been reviewed and considered negative except ones been listed in the HPI.   Studies Reviewed: SABRA   ECHO IMPRESSIONS 10/2022  1. Left ventricular ejection fraction, by estimation, is 60 to 65%. The left ventricle has normal function. The left ventricle has no regional wall motion abnormalities. Left ventricular diastolic parameters are consistent with Grade I diastolic dysfunction (impaired relaxation).   2. Right ventricular systolic function is normal. The right ventricular size is normal. There is normal pulmonary artery systolic pressure.   3. Right atrial size was mild to moderately dilated.   4. The mitral valve is grossly normal. Trivial mitral valve  regurgitation.   5. The aortic valve is tricuspid. There is mild calcification of the  aortic valve. There is mild thickening of the aortic valve. Aortic valve regurgitation is not visualized. Aortic valve sclerosis/calcification is present, without any evidence of  aortic stenosis. Aortic valve mean gradient measures 4.0 mmHg.   6. The inferior vena cava is normal in size with greater than 50% respiratory variability, suggesting right atrial pressure of 3 mmHg.   Comparison(s): No prior Echocardiogram.  Risk Assessment/Calculations:   {Does this patient have ATRIAL FIBRILLATION?:[id number]} No BP recorded.  {Refresh Note OR Click here to enter BP  :1}***       Physical  Exam:   VS:  There were no vitals taken for this visit.   Wt Readings from Last 3 Encounters:  05/15/24 109 lb 5.6 oz (49.6 kg)  08/24/23 109 lb 6.4 oz (49.6 kg)  02/28/23 109 lb 12.8 oz (49.8 kg)    GEN: Well nourished, well developed in no acute distress while sitting in chair.  NECK: No JVD; No carotid  bruits CARDIAC: ***RRR, no murmurs, rubs, gallops RESPIRATORY:  Clear to auscultation without rales, wheezing or rhonchi  ABDOMEN: Soft, non-tender, non-distended EXTREMITIES:  No edema; No deformity   ASSESSMENT AND PLAN: .   ***    {Are you ordering a CV Procedure (e.g. stress test, cath, DCCV, TEE, etc)?   Press F2        :[id number]}  Dispo: ***  Signed, Lorette CINDERELLA Kapur, PA-C

## 2024-10-09 ENCOUNTER — Encounter: Payer: Self-pay | Admitting: Physician Assistant

## 2024-10-09 ENCOUNTER — Ambulatory Visit

## 2024-10-09 ENCOUNTER — Ambulatory Visit: Attending: Physician Assistant | Admitting: Physician Assistant

## 2024-10-09 VITALS — BP 140/80 | HR 44 | Ht <= 58 in | Wt 103.0 lb

## 2024-10-09 DIAGNOSIS — I493 Ventricular premature depolarization: Secondary | ICD-10-CM | POA: Diagnosis present

## 2024-10-09 DIAGNOSIS — E785 Hyperlipidemia, unspecified: Secondary | ICD-10-CM | POA: Diagnosis present

## 2024-10-09 DIAGNOSIS — I10 Essential (primary) hypertension: Secondary | ICD-10-CM | POA: Diagnosis not present

## 2024-10-09 DIAGNOSIS — R001 Bradycardia, unspecified: Secondary | ICD-10-CM | POA: Diagnosis present

## 2024-10-09 NOTE — Addendum Note (Signed)
 Addended by: Iyla Balzarini A on: 10/09/2024 05:11 PM   Modules accepted: Orders

## 2024-10-09 NOTE — Patient Instructions (Signed)
 Medication Instructions:   Your physician recommends that you continue on your current medications as directed. Please refer to the Current Medication list given to you today.   Labwork:  None today  Testing/Procedures: ZIO XT- Long Term Monitor Instructions   Your physician has requested you wear your ZIO patch monitor____3___days.   This is a single patch monitor.  [employer] supplies one patch monitor per enrollment.  Additional stickers are not available.   Do not shower for the first 24 hours.  You may shower after the first 24 hours.   Press button if you feel a symptom. You will hear a small click.  Record Date, Time and Symptom in the Patient Log Book.   When you are ready to remove patch, follow instructions on last 2 pages of Patient Log Book.  Stick patch monitor onto last page of Patient Log Book.   Place Patient Log Book in [place] box.  Use locking tab on box and tape box closed securely.  The [employer] has [employer] on it.  Please place in mailbox as soon as possible.  Your physician should have your test results approximately 7 days after the monitor has been mailed back to [facility].   Call [facility] Customer Care at [phone] if you have questions regarding your ZIO XT patch monitor.  Call them immediately if you see an orange light blinking on your monitor.   If your monitor falls off in less than 4 days contact our Monitor department at [phone].  If your monitor becomes loose or falls off after 4 days call [employer] at [phone] for suggestions on securing your monitor.    Your physician has requested that you have an echocardiogram. Echocardiography is a painless test that uses sound waves to create images of your heart. It provides your doctor with information about the size and shape of your heart and how well your hearts chambers and valves are working. This procedure takes approximately one hour. There are no restrictions for  this procedure. Please do NOT wear cologne, perfume, aftershave, or lotions (deodorant is allowed). Please arrive 15 minutes prior to your appointment time.  Please note: We ask at that you not bring children with you during ultrasound (echo/ vascular) testing. Due to room size and safety concerns, children are not allowed in the ultrasound rooms during exams. Our front office staff cannot provide observation of children in our lobby area while testing is being conducted. An adult accompanying a patient to their appointment will only be allowed in the ultrasound room at the discretion of the ultrasound technician under special circumstances. We apologize for any inconvenience.    Follow-Up: 6-8 weeks S.Sheron, PA-C  Any Other Special Instructions Will Be Listed Below (If Applicable).  If you need a refill on your cardiac medications before your next appointment, please call your pharmacy.

## 2024-10-16 DIAGNOSIS — R001 Bradycardia, unspecified: Secondary | ICD-10-CM | POA: Diagnosis not present

## 2024-10-16 DIAGNOSIS — I10 Essential (primary) hypertension: Secondary | ICD-10-CM | POA: Diagnosis not present

## 2024-10-16 DIAGNOSIS — R413 Other amnesia: Secondary | ICD-10-CM | POA: Diagnosis not present

## 2024-10-16 DIAGNOSIS — E039 Hypothyroidism, unspecified: Secondary | ICD-10-CM | POA: Diagnosis not present

## 2024-10-16 DIAGNOSIS — Z79899 Other long term (current) drug therapy: Secondary | ICD-10-CM | POA: Diagnosis not present

## 2024-10-29 ENCOUNTER — Telehealth: Payer: Self-pay

## 2024-10-29 DIAGNOSIS — I493 Ventricular premature depolarization: Secondary | ICD-10-CM

## 2024-10-29 MED ORDER — METOPROLOL TARTRATE 25 MG PO TABS: 25.0000 mg | ORAL_TABLET | Freq: Two times a day (BID) | ORAL | 3 refills | Status: DC

## 2024-10-29 NOTE — Telephone Encounter (Signed)
 Spoke with daughter she said that her mom is already scheduled for 2/4 and that is basically a month. She wants to keep current appt that is already scheduled.

## 2024-10-29 NOTE — Telephone Encounter (Signed)
-----   Message from Vishnu Mallipeddi, MD sent at 10/29/2024  2:13 PM EST ----- Regarding: Event monitor I signed her event monitor report. 45.9% PVC burden. Start metoprolol tartarate 25 mg BID. HR 30-40s at home is from PVCs, erroneous interpretation from PVCs. Echo is scheduled in Jan 2026. Will need to evaluate her functional capacity and offer her ischemia evaluation. Needs 1 month office visit with APP/MD.   Scotty,you mentioned in your note to refer to EP if PVC burden is more than 10%. This is usually taken care by general cardiology. No need to refer to EP unless she needs ablation (which she won't be a candidate).

## 2024-10-29 NOTE — Telephone Encounter (Signed)
Spoke to daughter who verbalized understanding.  ?

## 2024-10-30 ENCOUNTER — Ambulatory Visit: Payer: Self-pay | Admitting: Physician Assistant

## 2024-11-05 ENCOUNTER — Telehealth: Payer: Self-pay | Admitting: Ophthalmology

## 2024-11-05 ENCOUNTER — Encounter: Payer: Self-pay | Admitting: Ophthalmology

## 2024-11-05 ENCOUNTER — Encounter: Payer: Self-pay | Admitting: Internal Medicine

## 2024-11-05 ENCOUNTER — Telehealth: Payer: Self-pay | Admitting: Internal Medicine

## 2024-11-05 DIAGNOSIS — R030 Elevated blood-pressure reading, without diagnosis of hypertension: Secondary | ICD-10-CM

## 2024-11-05 NOTE — Patient Instructions (Addendum)
 Hi Dr Shona and Dr. 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  One of the risk factors leading to this can be systemic hypotension, in particular nocturnal/nightime hypotension.   Megan Conley has worked closely with Dr Shona on controlling her BP but ideally not too aggressively.   Really trying to avoid periods of her blood pressure becoming too low.  Though they tell me she's struggled with her BPs being quite hight at times in the past.  This is a difficult problem.    If possible, I think if we could tailor her therapy to try to avoid or limit systemic meds that may over aggressively lower her BPs, particularly at night I think would be best.  I realize this may be a challenge in the setting of new onset PVCs.     [name]  Cell [phone]

## 2024-11-05 NOTE — Telephone Encounter (Signed)
 Will route to Dr. Mallipeddi.

## 2024-11-05 NOTE — Telephone Encounter (Signed)
 Pt eye doctor states pt has severe glaucoma. Requesting to speak with Dr about medications that are possibly causing it. Please advise.

## 2024-11-05 NOTE — Telephone Encounter (Signed)
 Returned call to number given. No answer. Left msg to call back.

## 2024-11-05 NOTE — Progress Notes (Unsigned)
 Hi Dr Shona and Dr. Mallipeddi,    Megan Conley has advanced normal tension glaucoma with pretty progressive vision loss.  One of the risk factors leading to this can be systemic hypotension, in particular nocturnal/nightime hypotension.  I've asked Megan Conley has worked closely with Dr Shona on controlling her BP but ideally not too aggressively in an effort to really avoid periods of her blood pressure becoming too low.  Though they tell me she's struggled with her BPs being quite hight at times in the past.  This is a difficult problem.    If possible, I think if we could tailor her therapy to try to avoid or limit systemic meds that may over aggressively lower her BPs, particularly at night I think would be best.  I realize this may be a challenge in the setting of new onset PVCs.     Megan Conley [facility]  Cell [phone]

## 2024-11-06 ENCOUNTER — Encounter: Payer: Self-pay | Admitting: Ophthalmology

## 2024-11-14 ENCOUNTER — Ambulatory Visit (HOSPITAL_COMMUNITY)
Admission: RE | Admit: 2024-11-14 | Discharge: 2024-11-14 | Disposition: A | Discharge status: Home / Self Care | Source: Ambulatory Visit | Attending: Physician Assistant | Admitting: Physician Assistant

## 2024-11-14 ENCOUNTER — Encounter: Payer: Self-pay | Admitting: Internal Medicine

## 2024-11-14 ENCOUNTER — Ambulatory Visit (INDEPENDENT_AMBULATORY_CARE_PROVIDER_SITE_OTHER): Admitting: Internal Medicine

## 2024-11-14 VITALS — BP 110/52 | HR 53 | Ht <= 58 in | Wt 102.6 lb

## 2024-11-14 DIAGNOSIS — I493 Ventricular premature depolarization: Secondary | ICD-10-CM

## 2024-11-14 DIAGNOSIS — R001 Bradycardia, unspecified: Secondary | ICD-10-CM | POA: Insufficient documentation

## 2024-11-14 LAB — ECHOCARDIOGRAM COMPLETE
Area-P 1/2: 2.99 cm2
Calc EF: 58.6 %
Height: 58 in
S' Lateral: 2.45 cm
Single Plane A2C EF: 58.3 %
Single Plane A4C EF: 59.7 %
Weight: 1641.6 [oz_av]

## 2024-11-14 MED ORDER — MIDODRINE HCL 2.5 MG PO TABS: 2.5000 mg | ORAL_TABLET | Freq: Three times a day (TID) | ORAL | 3 refills | Status: AC

## 2024-11-14 NOTE — Progress Notes (Signed)
*  PRELIMINARY RESULTS* Echocardiogram 2D Echocardiogram has been performed.  Megan Conley 11/14/2024, 1:45 PM

## 2024-11-14 NOTE — Patient Instructions (Signed)
 Medication Instructions:  Your physician has recommended you make the following change in your medication:   -Start Midodrine  2.5 mg three times daily  -Stop Metoprolol    *If you need a refill on your cardiac medications before your next appointment, please call your pharmacy*  Lab Work: None If you have labs (blood work) drawn today and your tests are completely normal, you will receive your results only by: MyChart Message (if you have MyChart) OR A paper copy in the mail If you have any lab test that is abnormal or we need to change your treatment, we will call you to review the results.  Testing/Procedures: None  Follow-Up: At [facility], you and your health needs are our priority.  As part of our continuing mission to provide you with exceptional heart care, our providers are all part of one team.  This team includes your primary Cardiologist (physician) and Advanced Practice Providers or APPs (Physician Assistants and Nurse Practitioners) who all work together to provide you with the care you need, when you need it.  Your next appointment:   6 week(s)  Provider:   You may see Vishnu P Mallipeddi, MD or one of the following Advanced Practice Providers on your designated Care Team:   Brittany Strader, PA-C  [place]ia Lago, [place] Olivia Pavy, [place]     We recommend signing up for the patient portal called MyChart.  Sign up information is provided on this After Visit Summary.  MyChart is used to connect with patients for Virtual Visits (Telemedicine).  Patients are able to view lab/test results, encounter notes, upcoming appointments, etc.  Non-urgent messages can be sent to your provider as well.   To learn more about what you can do with MyChart, go to [email or web address].   Other Instructions

## 2024-11-25 DIAGNOSIS — I493 Ventricular premature depolarization: Secondary | ICD-10-CM | POA: Insufficient documentation

## 2024-11-25 NOTE — Progress Notes (Signed)
 "    Cardiology Office Note  Date: 11/25/2024   ID: Megan, Conley 1936-03-24, MRN [id number]  PCP:  Megan Norleen PEDLAR, MD  Cardiologist:  Megan SHAUNNA Maywood, MD Electrophysiologist:  None    History of Present Illness: Megan Conley is a 89 y.o. female known to have HTN, hypothyroidism is here for follow-up visit of frequent PVCs. Accompanied by daughter. Collateral history is obtained from the daughter.  Previously on Bp meds. Had to be stopped due to hypotension. She underwent event monitor recently due to frequent PVCs on EKG and being symptomatic with dizziness. 3 day event monitor showed 45.9% PVC burden. Echo showed normal LV/RV function, mild MR and mild to moderate TR. Patient's ophthalmologist recommended to avoid systemic hypotension due to advanced glaucoma. She has no symptoms except for occasional dizziness. No angina, DOE, syncope. Never had ischemia evaluation before.    Past Medical History:  Diagnosis Date   Bowel obstruction (HCC)    Glaucoma    Hypothyroidism     Past Surgical History:  Procedure Laterality Date   ABDOMINAL HYSTERECTOMY     cataract extraction both eyes     COLONOSCOPY  06/02/2011   Procedure: COLONOSCOPY;  Surgeon: Megan RAYMOND Rivet, MD;  [facility];  Service: Endoscopy;  Laterality: N/A;   COLONOSCOPY N/A 03/22/2017   Procedure: COLONOSCOPY;  Surgeon: Conley Megan RAYMOND, MD;  Location: [facility];  Service: Endoscopy;  Laterality: N/A;  830 - MOVED TO 5/23 @ 10:30   LAPAROTOMY N/A 08/28/2020   Procedure: EXPLORATORY LAPAROTOMY;  Surgeon: Megan Manuelita BROCKS, MD;  Location: [facility];  Service: General;  Laterality: N/A;   LYSIS OF ADHESION N/A 08/28/2020   Procedure: LYSIS OF ADHESIONS;  Surgeon: Megan Manuelita BROCKS, MD;  Location: [facility];  Service: General;  Laterality: N/A;   POLYPECTOMY  03/22/2017   Procedure: POLYPECTOMY;  Surgeon: Conley Megan RAYMOND, MD;  Location: [facility];  Service: Endoscopy;;  colon    Current  Outpatient Medications  Medication Sig Dispense Refill   brimonidine  (ALPHAGAN ) 0.2 % ophthalmic solution Place 1 drop into both eyes 2 (two) times daily.     Calcium-Magnesium-Vitamin D (CALCIUM 1200+D3 PO) Take 1 tablet by mouth daily.     docusate sodium  (COLACE) 100 MG capsule Take 100 mg by mouth as needed for mild constipation.     dorzolamide (TRUSOPT) 2 % ophthalmic solution Place 1 drop into both eyes 2 (two) times daily.     latanoprost  (XALATAN ) 0.005 % ophthalmic solution Place 1 drop into both eyes at bedtime.     levothyroxine  (SYNTHROID , LEVOTHROID) 50 MCG tablet Take 50 mcg by mouth daily before breakfast.     midodrine  (PROAMATINE ) 2.5 MG tablet Take 1 tablet (2.5 mg total) by mouth 3 (three) times daily with meals. 270 tablet 3   Multiple Vitamin (MULTIVITAMIN WITH MINERALS) TABS tablet Take 1 tablet by mouth daily.     polyethylene glycol (MIRALAX ) 17 g packet Take 17 g by mouth daily as needed. 14 each 0   pravastatin (PRAVACHOL) 20 MG tablet Take 20 mg by mouth daily.     No current facility-administered medications for this visit.   Allergies:  Patient has no known allergies.   Social History: The patient  reports that she has never smoked. She has never used smokeless tobacco. She reports current alcohol use of about 4.0 standard drinks of alcohol per week. She reports that she does not use drugs.   Family History: The  patient's family history includes Heart Problems in her sister; Lung cancer in her father.   ROS:  Please see the history of present illness. Otherwise, complete review of systems is positive for none.  All other systems are reviewed and negative.   Physical Exam: VS:  BP (!) 110/52   Pulse (!) 53   Ht 4' 10 (1.473 m)   Wt 102 lb 9.6 oz (46.5 kg)   SpO2 97%   BMI 21.44 kg/m , BMI Body mass index is 21.44 kg/m.  Wt Readings from Last 3 Encounters:  11/14/24 102 lb 9.6 oz (46.5 kg)  10/09/24 103 lb (46.7 kg)  05/15/24 109 lb 5.6 oz (49.6 kg)     General: Patient appears comfortable at rest. HEENT: Conjunctiva and lids normal, oropharynx clear with moist mucosa. Neck: Supple, no elevated JVP or carotid bruits, no thyromegaly. Lungs: Clear to auscultation, nonlabored breathing at rest. Cardiac: Regular rate and rhythm, no S3 or significant systolic murmur, no pericardial rub. Abdomen: Soft, nontender, no hepatomegaly, bowel sounds present, no guarding or rebound. Extremities: No pitting edema, distal pulses 2+. Skin: Warm and dry. Musculoskeletal: No kyphosis. Neuropsychiatric: Alert and oriented x3, affect grossly appropriate.  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 D/c metoprolol . Start midodrine  2.5 mg TID. If her BP stays >130-140 mm Hg SBP, can start low dose metoprolol . Patient's daughter worried about low HR but average HR was 88 bpm on event monitor, reassuring. Likely false low HR readings on her BP machine at home due to frequent PVCs. - PVC burden was 45.9% on 3 day monitor. Reviewed and discussed with patient/family today. - She reported occasional dizziness. No syncope. Will start metoprolol  only after BP is increased and maintained. CT cardiac in the future for ischemia evaluation provided Bp is maintained. Echo showed normal LV/RV function, mild MR, mild to moderate TR.  # HTN, controlled - Previously on Losartan but not on it anymore to prevent systemic hypotension.  #  HLD - Continue pravastatin 20 mg nightly, follows with PCP.   I spent 30 minutes reviewing prior medical records, reports, >3 labs, discussion and documentation.  Disposition:  Follow up 6 weeks  Signed Shiana Rappleye Priya Melinda Gwinner, MD, 11/25/2024 12:22 AM    [facility] [address] [place], [place], [place] [address] "

## 2024-12-04 ENCOUNTER — Ambulatory Visit: Admitting: Physician Assistant

## 2025-01-01 ENCOUNTER — Ambulatory Visit: Admitting: Physician Assistant
